# Patient Record
Sex: Male | Born: 1935 | Race: Black or African American | Hispanic: No | Smoking: Former smoker
Health system: Southern US, Community
[De-identification: ages and names within clinical notes are randomized; demographics above are authoritative.]

## PROBLEM LIST (undated history)

## (undated) DIAGNOSIS — C61 Malignant neoplasm of prostate: Secondary | ICD-10-CM

## (undated) DIAGNOSIS — E039 Hypothyroidism, unspecified: Secondary | ICD-10-CM

## (undated) DIAGNOSIS — K219 Gastro-esophageal reflux disease without esophagitis: Secondary | ICD-10-CM

## (undated) DIAGNOSIS — I10 Essential (primary) hypertension: Secondary | ICD-10-CM

## (undated) DIAGNOSIS — I739 Peripheral vascular disease, unspecified: Secondary | ICD-10-CM

## (undated) DIAGNOSIS — I509 Heart failure, unspecified: Secondary | ICD-10-CM

## (undated) DIAGNOSIS — I219 Acute myocardial infarction, unspecified: Secondary | ICD-10-CM

## (undated) DIAGNOSIS — I2510 Atherosclerotic heart disease of native coronary artery without angina pectoris: Secondary | ICD-10-CM

## (undated) DIAGNOSIS — I495 Sick sinus syndrome: Secondary | ICD-10-CM

## (undated) DIAGNOSIS — I872 Venous insufficiency (chronic) (peripheral): Secondary | ICD-10-CM

## (undated) DIAGNOSIS — N189 Chronic kidney disease, unspecified: Secondary | ICD-10-CM

## (undated) DIAGNOSIS — I482 Chronic atrial fibrillation, unspecified: Secondary | ICD-10-CM

## (undated) DIAGNOSIS — J449 Chronic obstructive pulmonary disease, unspecified: Secondary | ICD-10-CM

## (undated) HISTORY — PX: FRACTURE SURGERY: SHX138

## (undated) HISTORY — DX: Malignant neoplasm of prostate: C61

## (undated) HISTORY — DX: Chronic atrial fibrillation, unspecified: I48.20

## (undated) HISTORY — DX: Heart failure, unspecified: I50.9

## (undated) HISTORY — DX: Peripheral vascular disease, unspecified: I73.9

## (undated) HISTORY — DX: Sick sinus syndrome: I49.5

## (undated) HISTORY — DX: Atherosclerotic heart disease of native coronary artery without angina pectoris: I25.10

## (undated) HISTORY — DX: Chronic obstructive pulmonary disease, unspecified: J44.9

## (undated) HISTORY — DX: Gastro-esophageal reflux disease without esophagitis: K21.9

## (undated) HISTORY — DX: Venous insufficiency (chronic) (peripheral): I87.2

## (undated) HISTORY — PX: EYE SURGERY: SHX253

## (undated) HISTORY — PX: CARDIAC SURGERY: SHX584

## (undated) HISTORY — DX: Essential (primary) hypertension: I10

---

## 2000-11-12 ENCOUNTER — Encounter: Payer: Self-pay | Admitting: Surgery

## 2000-11-14 ENCOUNTER — Encounter: Payer: Self-pay | Admitting: Surgery

## 2000-11-14 ENCOUNTER — Inpatient Hospital Stay (HOSPITAL_COMMUNITY): Admission: RE | Admit: 2000-11-14 | Discharge: 2000-11-21 | Payer: Self-pay | Admitting: Surgery

## 2000-11-15 ENCOUNTER — Encounter: Payer: Self-pay | Admitting: Thoracic Surgery (Cardiothoracic Vascular Surgery)

## 2000-11-16 ENCOUNTER — Encounter: Payer: Self-pay | Admitting: Surgery

## 2000-11-20 ENCOUNTER — Encounter: Payer: Self-pay | Admitting: Surgery

## 2004-10-03 ENCOUNTER — Emergency Department: Payer: Self-pay | Admitting: Unknown Physician Specialty

## 2004-10-03 ENCOUNTER — Other Ambulatory Visit: Payer: Self-pay

## 2005-09-13 ENCOUNTER — Ambulatory Visit: Payer: Self-pay | Admitting: Nurse Practitioner

## 2007-02-04 ENCOUNTER — Other Ambulatory Visit: Payer: Self-pay

## 2007-02-04 ENCOUNTER — Emergency Department: Payer: Self-pay | Admitting: Emergency Medicine

## 2008-09-02 ENCOUNTER — Ambulatory Visit: Payer: Self-pay | Admitting: Family Medicine

## 2010-03-16 ENCOUNTER — Emergency Department: Payer: Self-pay | Admitting: Emergency Medicine

## 2010-03-19 ENCOUNTER — Inpatient Hospital Stay: Payer: Self-pay | Admitting: Specialist

## 2011-08-31 ENCOUNTER — Emergency Department: Payer: Self-pay | Admitting: Emergency Medicine

## 2011-08-31 LAB — URINALYSIS, COMPLETE
Bilirubin,UR: NEGATIVE
Blood: NEGATIVE
Hyaline Cast: 6
Ketone: NEGATIVE
Leukocyte Esterase: NEGATIVE
Nitrite: NEGATIVE
Ph: 6 (ref 4.5–8.0)
Protein: NEGATIVE
Specific Gravity: 1.005 (ref 1.003–1.030)
WBC UR: <1 /HPF (ref 0–5)

## 2011-08-31 LAB — CBC
HCT: 34.4 % — ABNORMAL LOW (ref 40.0–52.0)
HGB: 11.4 g/dL — ABNORMAL LOW (ref 13.0–18.0)
MCH: 30.6 pg (ref 26.0–34.0)
MCHC: 33.2 g/dL (ref 32.0–36.0)
MCV: 92 fL (ref 80–100)
Platelet: 151 10*3/uL (ref 150–440)
RBC: 3.73 10*6/uL — ABNORMAL LOW (ref 4.40–5.90)
RDW: 14.9 % — ABNORMAL HIGH (ref 11.5–14.5)
WBC: 8 10*3/uL (ref 3.8–10.6)

## 2011-08-31 LAB — COMPREHENSIVE METABOLIC PANEL
Albumin: 3.5 g/dL (ref 3.4–5.0)
Alkaline Phosphatase: 90 U/L (ref 50–136)
Anion Gap: 7 (ref 7–16)
BUN: 38 mg/dL — ABNORMAL HIGH (ref 7–18)
Bilirubin,Total: 0.5 mg/dL (ref 0.2–1.0)
Calcium, Total: 8.4 mg/dL — ABNORMAL LOW (ref 8.5–10.1)
Chloride: 104 mmol/L (ref 98–107)
Co2: 29 mmol/L (ref 21–32)
Creatinine: 2.37 mg/dL — ABNORMAL HIGH (ref 0.60–1.30)
EGFR (African American): 28 — ABNORMAL LOW
EGFR (Non-African Amer.): 24 — ABNORMAL LOW
Glucose: 99 mg/dL (ref 65–99)
Osmolality: 288 (ref 275–301)
Potassium: 3.8 mmol/L (ref 3.5–5.1)
SGOT(AST): 22 U/L (ref 15–37)
SGPT (ALT): 18 U/L
Sodium: 140 mmol/L (ref 136–145)
Total Protein: 6.9 g/dL (ref 6.4–8.2)

## 2011-08-31 LAB — TROPONIN I
Troponin-I: <0.02 ng/mL
Troponin-I: <0.02 ng/mL

## 2011-08-31 LAB — PROTIME-INR
INR: 0.9
Prothrombin Time: 12.9 secs (ref 11.5–14.7)

## 2011-08-31 LAB — MAGNESIUM: Magnesium: 1.8 mg/dL

## 2011-08-31 LAB — CK TOTAL AND CKMB (NOT AT ARMC)
CK, Total: 276 U/L — ABNORMAL HIGH (ref 35–232)
CK-MB: 2.6 ng/mL (ref 0.5–3.6)

## 2011-08-31 LAB — TSH: Thyroid Stimulating Horm: 2.69 u[IU]/mL

## 2011-08-31 LAB — PRO B NATRIURETIC PEPTIDE: B-Type Natriuretic Peptide: 243 pg/mL (ref 0–450)

## 2012-09-07 ENCOUNTER — Observation Stay: Payer: Self-pay | Admitting: Specialist

## 2012-09-07 LAB — CBC
HCT: 34.1 % — ABNORMAL LOW (ref 40.0–52.0)
MCV: 90 fL (ref 80–100)
Platelet: 165 10*3/uL (ref 150–440)
RBC: 3.78 10*6/uL — ABNORMAL LOW (ref 4.40–5.90)
RDW: 14.8 % — ABNORMAL HIGH (ref 11.5–14.5)

## 2012-09-07 LAB — URINALYSIS, COMPLETE
Bacteria: NONE SEEN
Bilirubin,UR: NEGATIVE
Glucose,UR: NEGATIVE mg/dL (ref 0–75)
Hyaline Cast: 3
Ketone: NEGATIVE
Leukocyte Esterase: NEGATIVE
Protein: NEGATIVE
RBC,UR: <1 /HPF (ref 0–5)
Specific Gravity: 1.013 (ref 1.003–1.030)
WBC UR: 1 /HPF (ref 0–5)

## 2012-09-07 LAB — COMPREHENSIVE METABOLIC PANEL
Albumin: 3.5 g/dL (ref 3.4–5.0)
BUN: 34 mg/dL — ABNORMAL HIGH (ref 7–18)
Chloride: 103 mmol/L (ref 98–107)
Co2: 27 mmol/L (ref 21–32)
Glucose: 95 mg/dL (ref 65–99)
Osmolality: 285 (ref 275–301)
Potassium: 4.3 mmol/L (ref 3.5–5.1)
SGPT (ALT): 17 U/L (ref 12–78)
Sodium: 139 mmol/L (ref 136–145)

## 2012-09-07 LAB — TSH: Thyroid Stimulating Horm: 5.75 u[IU]/mL — ABNORMAL HIGH

## 2012-09-07 LAB — TROPONIN I: Troponin-I: <0.02 ng/mL

## 2012-09-07 LAB — MAGNESIUM: Magnesium: 1.4 mg/dL — ABNORMAL LOW

## 2012-09-08 LAB — CBC WITH DIFFERENTIAL/PLATELET
Basophil #: 0 10*3/uL (ref 0.0–0.1)
Basophil %: 0.5 %
Eosinophil #: 0.3 10*3/uL (ref 0.0–0.7)
Eosinophil %: 4.7 %
HCT: 30.8 % — ABNORMAL LOW (ref 40.0–52.0)
HGB: 10.1 g/dL — ABNORMAL LOW (ref 13.0–18.0)
Lymphocyte %: 12.5 %
MCH: 29.5 pg (ref 26.0–34.0)
MCHC: 32.7 g/dL (ref 32.0–36.0)
Monocyte #: 0.8 x10 3/mm (ref 0.2–1.0)
Neutrophil #: 4.9 10*3/uL (ref 1.4–6.5)
Platelet: 156 10*3/uL (ref 150–440)
WBC: 6.9 10*3/uL (ref 3.8–10.6)

## 2012-09-08 LAB — BASIC METABOLIC PANEL
Chloride: 103 mmol/L (ref 98–107)
Co2: 30 mmol/L (ref 21–32)
EGFR (African American): 34 — ABNORMAL LOW
Osmolality: 287 (ref 275–301)
Sodium: 140 mmol/L (ref 136–145)

## 2012-09-08 LAB — TROPONIN I
Troponin-I: 0.21 ng/mL — ABNORMAL HIGH
Troponin-I: 0.36 ng/mL — ABNORMAL HIGH

## 2012-09-08 LAB — LIPID PANEL
Cholesterol: 110 mg/dL (ref 0–200)
Ldl Cholesterol, Calc: 55 mg/dL (ref 0–100)
Triglycerides: 69 mg/dL (ref 0–200)
VLDL Cholesterol, Calc: 14 mg/dL (ref 5–40)

## 2013-04-16 ENCOUNTER — Ambulatory Visit: Payer: Self-pay | Admitting: Ophthalmology

## 2013-04-16 LAB — POTASSIUM: POTASSIUM: 4 mmol/L (ref 3.5–5.1)

## 2013-04-19 ENCOUNTER — Emergency Department: Payer: Self-pay | Admitting: Emergency Medicine

## 2013-05-30 ENCOUNTER — Emergency Department: Payer: Self-pay | Admitting: Emergency Medicine

## 2013-05-30 LAB — URINALYSIS, COMPLETE
BLOOD: NEGATIVE
Bacteria: NONE SEEN
Bilirubin,UR: NEGATIVE
GLUCOSE, UR: NEGATIVE mg/dL (ref 0–75)
Hyaline Cast: 8
Ketone: NEGATIVE
Leukocyte Esterase: NEGATIVE
Nitrite: NEGATIVE
Ph: 5 (ref 4.5–8.0)
Protein: NEGATIVE
RBC,UR: <1 /HPF (ref 0–5)
Specific Gravity: 1.01 (ref 1.003–1.030)
Squamous Epithelial: <1
WBC UR: 2 /HPF (ref 0–5)

## 2013-05-30 LAB — CBC
HCT: 36.2 % — ABNORMAL LOW (ref 40.0–52.0)
HGB: 11.8 g/dL — AB (ref 13.0–18.0)
MCH: 29.3 pg (ref 26.0–34.0)
MCHC: 32.6 g/dL (ref 32.0–36.0)
MCV: 90 fL (ref 80–100)
Platelet: 163 10*3/uL (ref 150–440)
RBC: 4.02 10*6/uL — ABNORMAL LOW (ref 4.40–5.90)
RDW: 15.1 % — AB (ref 11.5–14.5)
WBC: 9 10*3/uL (ref 3.8–10.6)

## 2013-05-30 LAB — TSH: THYROID STIMULATING HORM: 6.88 u[IU]/mL — AB

## 2013-05-30 LAB — COMPREHENSIVE METABOLIC PANEL
AST: 13 U/L — AB (ref 15–37)
Albumin: 3.2 g/dL — ABNORMAL LOW (ref 3.4–5.0)
Alkaline Phosphatase: 85 U/L
Anion Gap: 7 (ref 7–16)
BUN: 22 mg/dL — ABNORMAL HIGH (ref 7–18)
Bilirubin,Total: 0.3 mg/dL (ref 0.2–1.0)
CHLORIDE: 102 mmol/L (ref 98–107)
Calcium, Total: 8.2 mg/dL — ABNORMAL LOW (ref 8.5–10.1)
Co2: 30 mmol/L (ref 21–32)
Creatinine: 1.95 mg/dL — ABNORMAL HIGH (ref 0.60–1.30)
EGFR (African American): 35 — ABNORMAL LOW
EGFR (Non-African Amer.): 30 — ABNORMAL LOW
Glucose: 111 mg/dL — ABNORMAL HIGH (ref 65–99)
OSMOLALITY: 282 (ref 275–301)
Potassium: 3.7 mmol/L (ref 3.5–5.1)
SGPT (ALT): 18 U/L (ref 12–78)
Sodium: 139 mmol/L (ref 136–145)
Total Protein: 6.9 g/dL (ref 6.4–8.2)

## 2013-05-30 LAB — MAGNESIUM: MAGNESIUM: 1.6 mg/dL — AB

## 2013-05-30 LAB — TROPONIN I: Troponin-I: 0.02 ng/mL

## 2013-07-04 DIAGNOSIS — I2510 Atherosclerotic heart disease of native coronary artery without angina pectoris: Secondary | ICD-10-CM | POA: Diagnosis present

## 2013-07-23 ENCOUNTER — Ambulatory Visit: Payer: Self-pay | Admitting: Ophthalmology

## 2013-07-23 LAB — POTASSIUM: Potassium: 4 mmol/L (ref 3.5–5.1)

## 2013-08-05 ENCOUNTER — Ambulatory Visit: Payer: Self-pay | Admitting: Ophthalmology

## 2013-08-18 ENCOUNTER — Ambulatory Visit: Payer: Self-pay | Admitting: Ophthalmology

## 2013-08-18 LAB — POTASSIUM: Potassium: 4.1 mmol/L (ref 3.5–5.1)

## 2013-08-26 ENCOUNTER — Ambulatory Visit: Payer: Self-pay | Admitting: Ophthalmology

## 2014-06-19 NOTE — Consult Note (Signed)
   Present Illness 81 yo male with history of cad s/p cabg at [facility] in 2002 with a lima to lad, svg to D1, svg to OM1, and svg to PDA who was admitted with an episode of increased shortness of breath, weakness and near syncope. EMS was called and he was noted to be in a narrow complex tachycardia with rates near 200. He is currently in nsr with pacs at 80-90 bpm. He denies any prior events such as this. His lv function was normal by echo one year ago. He has a mild serum troponin elevation to 0.3. He also has CKD with serum creatinine of 2.2. He is asymptomatic at present. He denies missing any of his medications and denies chest pain or shrontess of breath at present.   Physical Exam:  GEN no acute distress, obese   HEENT PERRL, hearing intact to voice   NECK supple   RESP normal resp effort  clear BS   CARD Regular rate and rhythm  No murmur  frequent palpitations   ABD no hernia  normal BS  no Adominal Mass   LYMPH negative neck, negative axillae   EXTR negative cyanosis/clubbing, negative edema   SKIN normal to palpation   NEURO cranial nerves intact, motor/sensory function intact   PSYCH A+O to time, place, person   Review of Systems:  Subjective/Chief Complaint episode of tachycardia with associated dizziness   General: No Complaints   Skin: No Complaints   ENT: No Complaints   Eyes: No Complaints   Neck: No Complaints   Respiratory: No Complaints   Cardiovascular: Palpitations   Gastrointestinal: No Complaints   Genitourinary: No Complaints   Vascular: No Complaints   Musculoskeletal: No Complaints   Neurologic: Dizzness   Hematologic: No Complaints   Endocrine: No Complaints   Psychiatric: No Complaints   Review of Systems: All other systems were reviewed and found to be negative   Medications/Allergies Reviewed Medications/Allergies reviewed   EKG:  Interpretation narrow complex tachcyardia converted to nsr with pacs. Afib with rvr vs  rentry tachcyardia    No Known Allergies:    Impression 81 yo male with history of cad s/p cabg in 2003 admitted with dizziness and rapid heart rate. Rhythm appears to be afib with rvr vs a re-entry tachycardia. Mild troponin elevation which appears to be secondary to demand with his rapid heart rate in face of CKD with creatinine of 2.2. He is in nsr at present with pacs. Was on metoprolol succinate at 25 mg daily as outpatient. Echo pending. Does not appear to be in chf or ischemic at present. Brain mri, ct, carotids negative for signficant abnormalities   Plan 1. Continue with metoprolol 25 bid 2. Conitnue with other cardiac meds 3. Review echo when available 4. Ambulate on current meds 5. Continue with asa, would defer chronic anticoagulation at present 6. If echo unremarkable and ambulates well, discharge in 24-48 hours with 48 hour holter in place 7. Follow up with Dr. Nehemiah Massed as outpatient   Electronic Signatures: Teodoro Spray (MD)  (Signed 13-Jul-14 10:35)  Authored: General Aspect/Present Illness, History and Physical Exam, Review of System, EKG , Allergies, Impression/Plan   Last Updated: 13-Jul-14 10:35 by Teodoro Spray (MD)

## 2014-06-19 NOTE — Discharge Summary (Signed)
PATIENT NAME:  Gary Grant, Gary Grant MR#:  [id number] DATE OF BIRTH:  11-Sep-1923  DATE OF ADMISSION:  09/07/2012 DATE OF DISCHARGE:  09/08/2012  For detailed note, please take a look at the history and physical done on admission by Dr. Leslye Peer.   DISCHARGE DIAGNOSES: Supraventricular tachycardia, now resolved. Benign prostatic hypertrophy. Suspected transient ischemic attack ruled out. Hyperlipidemia. Elevated troponin, likely in the setting of demand ischemia. Chronic kidney disease, stage III.   DIET: The patient is discharged on a low-sodium, low-fat diet.   ACTIVITY: As tolerated.   FOLLOWUP: With Dr. Ubaldo Glassing in the next 1 to 2 weeks. Also follow up with Dr. Delight Stare in the next 1 to 2 weeks.   DISCHARGE MEDICATIONS: Enalapril 2.5 mg daily, aspirin 81 mg daily, Lasix 40 mg daily in the morning and 1 tablet at lunch as needed for swelling, atorvastatin 20 mg at bedtime, amlodipine 5 mg daily, Flomax 0.4 mg daily, vitamin D 1000 international units daily, metoprolol tartrate 25 mg b.i.d.   CONSULTANTS DURING THE HOSPITAL COURSE: Dr. Jordan Hawks from cardiology.   PERTINENT STUDIES DONE DURING THE HOSPITAL COURSE: CT scan of the head done without contrast on admission showing no acute intracranial process. A chest x-ray done on admission showing no acute cardiopulmonary disease. An MRI of the brain done without contrast showing no acute intracranial abnormality. An ultrasound of the carotids showing no evidence of any hemodynamically significant carotid artery stenosis. A 2-dimensional echocardiogram done showing ejection fraction to be 60% to 65%, normal global LV systolic function, mildly dilated left atrium, mild to moderate aortic regurgitation.   HOSPITAL COURSE: This is an 81 year old male with medical problems as mentioned above, presented to the hospital with dizziness, slurred speech and weakness on the left side and noted to be in supraventricular tachycardia.  1.  SVT: The patient presented  to the hospital with heart rates in the 130s to 140s. The patient was given pulse doses of IV Cardizem, and his metoprolol dose was increased from metoprolol 25 daily to metoprolol 25 b.i.d. The patient's heart rate significantly improved. He is now back in normal sinus rhythm. It was unclear whether the SVT was A. fib or flutter or reentrant supraventricular tachycardia. A cardiology consult was obtained. They  recommended that the patient be discharged on a 48-hour Holter, which was arranged for the patient. The patient's echocardiogram did not show any evidence of LV dysfunction and since the patient was ambulated and had no further episodes of SVT and is not symptomatic, he was discharged home.  2.  Elevated troponin: This was likely in the setting of demand ischemia from the SVT. The patient had no acute chest pain. His troponins actually trended down. His echo showed no evidence of LV dysfunction. He will continue his aspirin, statin and beta blocker and continue followup with cardiology as an outpatient.  3.  Chronic kidney disease, stage III: The patient's creatinine is at baseline. He will continue followup as outpatient with Dr. Candiss Norse.  4.  Suspected TIA/CVA: The patient presented to the hospital with some slurred speech, dizziness and left-sided weakness. He had a CT head done on admission, which was negative. He had a followup MRI brain and a carotid duplex, which were also negative, and he is clinically asymptomatic now. His echo also did not show any evidence of any acute thrombus. He will continue his aspirin and statin as stated. Therefore, the TIA/CVA has now been ruled out.  5.  Hyperlipidemia: The patient was maintained on  his atorvastatin. He will resume that.  6.  BPH: He had no evidence of urinary obstruction. He will continue his Flomax upon discharge.   CODE STATUS: The patient is a full code.   TIME SPENT: 40 minutes.   ____________________________ Belia Heman. Verdell Carmine,  MD vjs:jm D: 09/09/2012 14:20:42 ET T: 09/09/2012 16:26:31 ET JOB#: [id number]  cc: Belia Heman. Verdell Carmine, MD, <Dictator> Javier Docker. Ubaldo Glassing, MD Marguerita Merles, MD Henreitta Leber MD ELECTRONICALLY SIGNED 09/09/2012 20:45

## 2014-06-19 NOTE — H&P (Signed)
PATIENT NAME:  Gary Grant, Gary Grant MR#:  [id number] DATE OF BIRTH:  1923/07/22  DATE OF ADMISSION:  09/07/2012  PRIMARY CARE PHYSICIAN: Terrence Dupont R. Jimmye Norman, MD, at the [facility].   CARDIOLOGIST: Corey Skains, MD  CHIEF COMPLAINT: Shortness of breath, headache, sweating, dizziness.   HISTORY OF PRESENT ILLNESS: This is an 81 year old man who was sitting under the tree all day. He was walking around the yard then and bending over to pick up papers and felt dizzy. He went into the house. He could not see very well, saw white blobs and blurring of vision, both eyes. He had a headache. He was cold to the touch and sweaty and some shortness of breath. No complaints of chest pain. He did have a headache. When EMS arrived, he was found to be in SVT. He was brought to the Emergency Room. In the ER, he was found to have left-sided weakness, but the patient states that he had a fall the other day and he hurt his shoulder and is having pain in the left shoulder. He was also found to have a magnesium of 1.4 and hospitalist services were contacted for further evaluation. He also did have on exam left leg weakness.   PAST MEDICAL HISTORY: Hypertension, BPH, edema, coronary artery disease.   PAST SURGICAL HISTORY: CABG back in 2003, right leg fracture as a child.   ALLERGIES: No known drug allergies.   MEDICATIONS: Include Lasix 40 mg once a day and he occasionally takes it twice a day, enalapril 2.5 mg daily, metoprolol ER 25 mg daily, Norvasc 5 mg daily, Lipitor 20 mg daily, Flomax 0.4 mg daily, aspirin 81 mg daily, vitamin D3 at 1000 international units daily.   SOCIAL HISTORY: No smoking. Occasional alcohol. No drug use. He used to work as a Sports coach. He lives alone.   FAMILY HISTORY: Father died at 20 of heart disease. Mother died at 48, likely had heart disease and hypertension.   REVIEW OF SYSTEMS: CONSTITUTIONAL: Positive for sweating. No fever or chills. No weakness. No weight loss.  EYES: He  did have blurry vision, both eyes, which lasted about 20 minutes.  EARS, NOSE, MOUTH AND THROAT: Decreased hearing. No sore throat. No difficulty swallowing.  CARDIOVASCULAR: No chest pain. No palpitations.  RESPIRATORY: Positive for shortness of breath. No coughing. No sputum. No hemoptysis.  GASTROINTESTINAL: Positive for constipation. No nausea. No vomiting. No abdominal pain. No bright red blood per rectum. No melena.  GENITOURINARY: No burning on urination. No hematuria.  MUSCULOSKELETAL: Positive for left shoulder pain and limited motion.  INTEGUMENTARY: No rashes or eruptions.  NEUROLOGIC: No fainting or blackouts.  PSYCHIATRIC: No anxiety or depression.  ENDOCRINE: No thyroid problems.  HEMATOLOGIC AND LYMPHATIC: No anemia.   PHYSICAL EXAMINATION: VITAL SIGNS: Temperature 98.3, pulse 91, respirations 20, blood pressure 133/77, pulse oximetry 97% on oxygen.  GENERAL: No respiratory distress.  EYES: Conjunctivae and lids normal. Pupils equal, round and reactive to light. Extraocular muscles intact. No nystagmus.  EARS, NOSE, MOUTH AND THROAT: Tympanic membranes: No erythema. Nasal mucosa: No erythema. Throat: No erythema, no exudate seen. Lips and gums: No lesions.  NECK: No JVD. No bruits. No lymphadenopathy. No thyromegaly. No thyroid nodules palpated.  LUNGS: Clear to auscultation. No use of accessory muscles to breathe. No rhonchi, rales or wheeze heard.  CARDIOVASCULAR: S1, S2 normal. No gallops, rubs or murmurs heard. Carotid upstroke 2+  bilaterally. No bruits.  EXTREMITIES: Dorsalis pedis pulses 2+ bilaterally, 3+ edema.  ABDOMEN: Soft, nontender.  No organomegaly/splenomegaly. Normoactive bowel sounds. No masses felt.  LYMPHATIC: No lymph nodes in the neck.  MUSCULOSKELETAL: No clubbing, 3+ edema, no cyanosis.  SKIN: No ulcers or lesions seen.  NEUROLOGIC: Cranial nerves II through XII grossly intact. Deep tendon reflexes 1+ bilateral lower extremities. Power 5 out of 5 right  upper and lower extremity, 3+ on the left lower extremity. Grip strength on the left upper extremity 5 out of 5. Arm strength 5 out of 5. Unable to move left shoulder very well secondary to pain with movement.  PSYCHIATRIC: The patient is oriented to person, place and time.   LABORATORY, DIAGNOSTIC AND RADIOLOGICAL DATA: CT scan of the head was negative. Glucose 95, BUN 34, creatinine 2.21, sodium 139, potassium 4.3, chloride 103, CO2 of 27, calcium 8.5. Liver function tests: Normal range. GFR 30. White blood cell count 7.7, hemoglobin and hematocrit 11.4 and 34.1, platelet count 165. Magnesium 1.4. TSH 5.75. Troponin negative. Urinalysis negative. INR 1. EKG by EMS showed an SVT greater than 150. EKG here showed a normal sinus rhythm at 96 beats per minute.   ASSESSMENT AND PLAN: 1.  Transient supraventricular tachycardia: Will increase metoprolol to 25 mg b.i.d. Get an echocardiogram. Replace magnesium and give intravenous fluid hydration.  2.  Left-sided weakness: Will rule out a cerebrovascular accident. Will get an MRI of the brain, echocardiogram, carotid ultrasound. Will continue his aspirin for right now. If stroke is positive, will have to take a step up in treatment with either Plavix or Aggrenox. Will get a physical therapy and an occupational therapy evaluation.  3.  Left shoulder pain with limited movement: Most likely frozen shoulder from trauma. Will get an x-ray of the left shoulder and an occupational therapy evaluation.  4.  Hypertension: We will hold Lasix, enalapril and Norvasc for right now since blood pressure on the lower side, and increasing the metoprolol to twice a day.  5.  Chronic kidney disease, stage III, borderline stage IV: Will monitor with intravenous fluid hydration.  6.  Benign prostatic hypertrophy: Continue Flomax.  7.  Hyperlipidemia: Continue statin. Check a lipid profile in the morning.  8.  Hypomagnesemia: Replace magnesium intravenously. Recheck in the morning.    TIME SPENT ON ADMISSION: 55 minutes.    ____________________________ Tana Conch. Leslye Peer, MD rjw:jm D: 09/07/2012 18:25:27 ET T: 09/07/2012 20:35:49 ET JOB#: [id number]  cc: Tana Conch. Leslye Peer, MD, <Dictator> Myrle Sheng. Jimmye Norman, MD Corey Skains, MD  Marisue Brooklyn MD ELECTRONICALLY SIGNED 09/10/2012 19:09

## 2014-06-20 NOTE — Op Note (Signed)
PATIENT NAME:  Gary Grant, Gary Grant MR#:  [id number] DATE OF BIRTH:  19-Sep-1923  DATE OF PROCEDURE:  08/05/2013  LOCATION:  [place]  PREOPERATIVE DIAGNOSIS: Visually significant cataract of the right eye.   POSTOPERATIVE DIAGNOSIS: Visually significant cataract of the right eye.   OPERATIVE PROCEDURE: Cataract extraction by phacoemulsification with implant of intraocular lens to right eye.   SURGEON: Birder Robson, MD.   ANESTHESIA:  1. Managed anesthesia care.  2. Topical tetracaine drops followed by 2% Xylocaine jelly applied in the preoperative holding area.   COMPLICATIONS: None.   TECHNIQUE:  Stop and chop.  DESCRIPTION OF PROCEDURE: The patient was examined and consented in the preoperative holding area where the aforementioned topical anesthesia was applied to the right eye and then brought back to the Operating Room where the right eye was prepped and draped in the usual sterile ophthalmic fashion and a lid speculum was placed. A paracentesis was created with the side port blade and the anterior chamber was filled with viscoelastic. A near clear corneal incision was performed with the steel keratome. A continuous curvilinear capsulorrhexis was performed with a cystotome followed by the capsulorrhexis forceps. Hydrodissection and hydrodelineation were carried out with BSS on a blunt cannula. The lens was removed in a stop and chop technique and the remaining cortical material was removed with the irrigation-aspiration handpiece. The capsular bag was inflated with viscoelastic and the Tecnis ZCB00, 18.5-diopter lens, serial number [id number] was placed in the capsular bag without complication. The remaining viscoelastic was removed from the eye with the irrigation-aspiration handpiece. The wounds were hydrated. The anterior chamber was flushed with Miostat and the eye was inflated to physiologic pressure. 0.1 mL of cefuroxime concentration 10 mg/mL was placed in the anterior  chamber. The wounds were found to be water tight. The eye was dressed with Vigamox and Combigan. The patient was given protective glasses to wear throughout the day and a shield with which to sleep tonight. The patient was also given drops with which to begin a drop regimen today and will follow up with me in one day.    ____________________________ Livingston Diones. Etienne Mowers, MD wlp:dd D: 08/05/2013 15:30:19 ET T: 08/05/2013 19:45:43 ET JOB#: [id number]  cc: Earnestine L. Anet Logsdon, MD, <Dictator> Livingston Diones Jatasia Gundrum MD ELECTRONICALLY SIGNED 08/08/2013 12:50

## 2014-06-20 NOTE — Op Note (Signed)
PATIENT NAME:  Gary Grant, Gary Grant MR#:  [id number] DATE OF BIRTH:  07/18/1923  DATE OF PROCEDURE:  08/26/2013  PREOPERATIVE DIAGNOSIS: Visually significant cataract of the left eye.   POSTOPERATIVE DIAGNOSIS: Visually significant cataract of the left eye.   OPERATIVE PROCEDURE: Cataract extraction by phacoemulsification with implant of intraocular lens to left eye.   SURGEON: Birder Robson, MD.   ANESTHESIA:  1. Managed anesthesia care.  2. Topical tetracaine drops followed by 2% Xylocaine jelly applied in the preoperative holding area.   COMPLICATIONS: None.   TECHNIQUE:  Stop and chop.   DESCRIPTION OF PROCEDURE: The patient was examined and consented in the preoperative holding area where the aforementioned topical anesthesia was applied to the left eye and then brought back to the Operating Room where the left eye was prepped and draped in the usual sterile ophthalmic fashion and a lid speculum was placed. A paracentesis was created with the side port blade and the anterior chamber was filled with viscoelastic. A near clear corneal incision was performed with the steel keratome. A continuous curvilinear capsulorrhexis was performed with a cystotome followed by the capsulorrhexis forceps. Hydrodissection and hydrodelineation were carried out with BSS on a blunt cannula. The lens was removed in a stop and chop technique and the remaining cortical material was removed with the irrigation-aspiration handpiece. The capsular bag was inflated with viscoelastic and the Tecnis ZCB00 19.0-diopter lens, serial number [id number] was placed in the capsular bag without complication. The remaining viscoelastic was removed from the eye with the irrigation-aspiration handpiece. The wounds were hydrated. The anterior chamber was flushed with Miostat and the eye was inflated to physiologic pressure. 0.1 mL of cefuroxime concentration 10 mg/mL was placed in the anterior chamber. The wounds were found to be water  tight. The eye was dressed with Vigamox. The patient was given protective glasses to wear throughout the day and a shield with which to sleep tonight. The patient was also given drops with which to begin a drop regimen today and will follow-up with me in one day.    ____________________________ Gary Diones. Porfilio, MD wlp:lt D: 08/26/2013 14:28:11 ET T: 08/26/2013 21:59:47 ET JOB#: [id number]  cc: Aikam L. Porfilio, MD, <Dictator> Gary Diones PORFILIO MD ELECTRONICALLY SIGNED 08/28/2013 10:49

## 2014-09-04 ENCOUNTER — Encounter: Payer: Self-pay | Admitting: *Deleted

## 2015-05-02 ENCOUNTER — Emergency Department: Payer: Medicare HMO

## 2015-05-02 ENCOUNTER — Emergency Department
Admission: EM | Admit: 2015-05-02 | Discharge: 2015-05-02 | Disposition: A | Discharge status: Home / Self Care | Payer: Medicare HMO | Source: Home / Self Care | Attending: Emergency Medicine | Admitting: Emergency Medicine

## 2015-05-02 DIAGNOSIS — R109 Unspecified abdominal pain: Secondary | ICD-10-CM | POA: Insufficient documentation

## 2015-05-02 DIAGNOSIS — R1013 Epigastric pain: Secondary | ICD-10-CM | POA: Diagnosis not present

## 2015-05-02 DIAGNOSIS — Z7982 Long term (current) use of aspirin: Secondary | ICD-10-CM

## 2015-05-02 DIAGNOSIS — N179 Acute kidney failure, unspecified: Secondary | ICD-10-CM | POA: Diagnosis not present

## 2015-05-02 DIAGNOSIS — Z79899 Other long term (current) drug therapy: Secondary | ICD-10-CM

## 2015-05-02 DIAGNOSIS — I10 Essential (primary) hypertension: Secondary | ICD-10-CM

## 2015-05-02 LAB — URINALYSIS COMPLETE WITH MICROSCOPIC (ARMC ONLY)
BACTERIA UA: NONE SEEN
BILIRUBIN URINE: NEGATIVE
Glucose, UA: NEGATIVE mg/dL
Hgb urine dipstick: NEGATIVE
KETONES UR: NEGATIVE mg/dL
LEUKOCYTES UA: NEGATIVE
NITRITE: NEGATIVE
PH: 7 (ref 5.0–8.0)
PROTEIN: NEGATIVE mg/dL
SPECIFIC GRAVITY, URINE: 1.016 (ref 1.005–1.030)
Squamous Epithelial / LPF: NONE SEEN

## 2015-05-02 LAB — CBC
HEMATOCRIT: 34 % — AB (ref 40.0–52.0)
Hemoglobin: 11 g/dL — ABNORMAL LOW (ref 13.0–18.0)
MCH: 30 pg (ref 26.0–34.0)
MCHC: 32.5 g/dL (ref 32.0–36.0)
MCV: 92.3 fL (ref 80.0–100.0)
Platelets: 179 10*3/uL (ref 150–440)
RBC: 3.68 MIL/uL — AB (ref 4.40–5.90)
RDW: 14.8 % — AB (ref 11.5–14.5)
WBC: 7.9 10*3/uL (ref 3.8–10.6)

## 2015-05-02 LAB — COMPREHENSIVE METABOLIC PANEL
ALT: 12 U/L — ABNORMAL LOW (ref 17–63)
ANION GAP: 6 (ref 5–15)
AST: 14 U/L — ABNORMAL LOW (ref 15–41)
Albumin: 3.7 g/dL (ref 3.5–5.0)
Alkaline Phosphatase: 84 U/L (ref 38–126)
BILIRUBIN TOTAL: 0.7 mg/dL (ref 0.3–1.2)
BUN: 43 mg/dL — ABNORMAL HIGH (ref 6–20)
CHLORIDE: 107 mmol/L (ref 101–111)
CO2: 31 mmol/L (ref 22–32)
Calcium: 8.5 mg/dL — ABNORMAL LOW (ref 8.9–10.3)
Creatinine, Ser: 2.07 mg/dL — ABNORMAL HIGH (ref 0.61–1.24)
GFR, EST AFRICAN AMERICAN: 31 mL/min — AB (ref >60)
GFR, EST NON AFRICAN AMERICAN: 27 mL/min — AB (ref >60)
Glucose, Bld: 99 mg/dL (ref 65–99)
POTASSIUM: 4.5 mmol/L (ref 3.5–5.1)
Sodium: 144 mmol/L (ref 135–145)
TOTAL PROTEIN: 7.1 g/dL (ref 6.5–8.1)

## 2015-05-02 LAB — TROPONIN I: Troponin I: <0.03 ng/mL (ref <0.031)

## 2015-05-02 LAB — LIPASE, BLOOD: LIPASE: 29 U/L (ref 11–51)

## 2015-05-02 MED ORDER — GI COCKTAIL ~~LOC~~
30.0000 mL | Freq: Once | ORAL | Status: AC
  Administered 2015-05-02: 30 mL via ORAL

## 2015-05-02 MED ORDER — IOHEXOL 240 MG/ML SOLN: 50.0000 mL | Freq: Once | INTRAMUSCULAR | Status: DC | PRN

## 2015-05-02 MED ORDER — GI COCKTAIL ~~LOC~~
ORAL | Status: AC
Start: 2015-05-02 — End: 2015-05-02
  Administered 2015-05-02: 30 mL via ORAL
  Filled 2015-05-02: qty 30

## 2015-05-02 MED ORDER — SUCRALFATE 1 G PO TABS: 1.0000 g | ORAL_TABLET | Freq: Four times a day (QID) | ORAL | Status: DC

## 2015-05-02 NOTE — ED Notes (Signed)
Pt reports upper abd pain for past week. Denies vomiting or diarrhea. Denies fever reports feeling sore all other.

## 2015-05-02 NOTE — ED Notes (Signed)
Discussed discharge instructions, prescriptions, and follow-up care with patient and daughter, with pt's permission. No questions or concerns at this time. Pt stable at discharge.

## 2015-05-02 NOTE — Discharge Instructions (Signed)
Please seek medical attention for any high fevers, chest pain, shortness of breath, change in behavior, persistent vomiting, bloody stool or any other new or concerning symptoms. ° ° °Abdominal Pain, Adult °Many things can cause belly (abdominal) pain. Most times, the belly pain is not dangerous. Many cases of belly pain can be watched and treated at home. °HOME CARE  °· Do not take medicines that help you go poop (laxatives) unless told to by your doctor. °· Only take medicine as told by your doctor. °· Eat or drink as told by your doctor. Your doctor will tell you if you should be on a special diet. °GET HELP IF: °· You do not know what is causing your belly pain. °· You have belly pain while you are sick to your stomach (nauseous) or have runny poop (diarrhea). °· You have pain while you pee or poop. °· Your belly pain wakes you up at night. °· You have belly pain that gets worse or better when you eat. °· You have belly pain that gets worse when you eat fatty foods. °· You have a fever. °GET HELP RIGHT AWAY IF:  °· The pain does not go away within 2 hours. °· You keep throwing up (vomiting). °· The pain changes and is only in the right or left part of the belly. °· You have bloody or tarry looking poop. °MAKE SURE YOU:  °· Understand these instructions. °· Will watch your condition. °· Will get help right away if you are not doing well or get worse. °  °This information is not intended to replace advice given to you by your health care provider. Make sure you discuss any questions you have with your health care provider. °  °Document Released: 08/02/2007 Document Revised: 03/06/2014 Document Reviewed: 10/23/2012 °Elsevier Interactive Patient Education ©2016 [employer] ° °

## 2015-05-02 NOTE — ED Notes (Signed)
CT notified pt finished with oral contrast. CT said they will put him on their list.

## 2015-05-02 NOTE — ED Provider Notes (Signed)
[facility] Emergency Department Provider Note    ____________________________________________  Time seen: ~1225  I have reviewed the triage vital signs and the nursing notes.   HISTORY  Chief Complaint Abdominal Pain   History limited by: Not Limited   HPI Gary Grant is a 82 y.o. male who presents to the emergency department today because of concerns for abdominal pain. He states that the pain is located in the epigastric region. It started 2 weeks ago. It is intermittent. It comes and goes. It is sharp. It will become severe. He denies any change of the pain with eating. He states that sometimes it is worse depending on what position he is in. He is tried Alka-Seltzer and Zantac without any relief. He denies any associated fevers. No associated worsening of shortness of breath. No change in defecation. No change in urination.    Past Medical History  Diagnosis Date  . Prostate cancer   . SSS (sick sinus syndrome)   . Chronic a-fib   . GERD (gastroesophageal reflux disease)   . CAD (coronary artery disease)   . PAD (peripheral artery disease)   . Venous insufficiency   . HTN (hypertension)   . COPD (chronic obstructive pulmonary disease)     There are no active problems to display for this patient.   No past surgical history on file.  Current Outpatient Rx  Name  Route  Sig  Dispense  Refill  . amLODipine (NORVASC) 2.5 MG tablet   Oral   Take 2.5 mg by mouth daily.         Marland Kitchen aspirin 81 MG tablet   Oral   Take 81 mg by mouth daily.         . Cholecalciferol (VITAMIN D PO)   Oral   Take by mouth.         . cilostazol (PLETAL) 50 MG tablet   Oral   Take 50 mg by mouth 2 (two) times daily.         . furosemide (LASIX) 20 MG tablet   Oral   Take 30 mg by mouth.         . Nebivolol HCl 20 MG TABS   Oral   Take by mouth.         . pantoprazole (PROTONIX) 40 MG tablet   Oral   Take 40 mg by mouth daily.          . Rosuvastatin Calcium (CRESTOR PO)   Oral   Take by mouth.           Allergies Review of patient's allergies indicates no known allergies.  Family History  Problem Relation Age of Onset  . Osteoporosis Mother   . Heart Problems Father   . Stroke Maternal Grandfather   . Stroke Paternal Grandfather   . Stroke Paternal Grandmother     Social History Social History  Substance Use Topics  . Smoking status: Not on file  . Smokeless tobacco: Not on file  . Alcohol Use: Not on file    Review of Systems  Constitutional: Negative for fever. Cardiovascular: Negative for chest pain. Respiratory: Negative for shortness of breath. Gastrointestinal: positive for epigastric pain Neurological: Negative for headaches, focal weakness or numbness.  10-point ROS otherwise negative.  ____________________________________________   PHYSICAL EXAM:  VITAL SIGNS: ED Triage Vitals  Enc Vitals Group     BP 05/02/15 0909 151/47 mmHg     Pulse Rate 05/02/15 0909 44  Resp 05/02/15 0909 16     Temp 05/02/15 0909 98.1 F (36.7 C)     Temp src --      SpO2 05/02/15 0909 97 %     Weight 05/02/15 0909 270 lb (122.471 kg)     Height 05/02/15 0909 5\' 11"  (1.803 m)     Head Cir --      Peak Flow --      Pain Score 05/02/15 0910 10   Constitutional: Alert and oriented. Well appearing and in no distress. Eyes: Conjunctivae are normal. PERRL. Normal extraocular movements. ENT   Head: Normocephalic and atraumatic.   Nose: No congestion/rhinnorhea.   Mouth/Throat: Mucous membranes are moist.   Neck: No stridor. Hematological/Lymphatic/Immunilogical: No cervical lymphadenopathy. Cardiovascular: Normal rate, regular rhythm.  No murmurs, rubs, or gallops. Respiratory: Normal respiratory effort without tachypnea nor retractions. Breath sounds are clear and equal bilaterally. No wheezes/rales/rhonchi. Gastrointestinal: Soft and tender to palpation in the epigastric region.   Genitourinary: Deferred Musculoskeletal: Normal range of motion in all extremities. No joint effusions.  No lower extremity tenderness nor edema. Neurologic:  Normal speech and language. No gross focal neurologic deficits are appreciated.  Skin:  Skin is warm, dry and intact. No rash noted. Psychiatric: Mood and affect are normal. Speech and behavior are normal. Patient exhibits appropriate insight and judgment.  ____________________________________________    LABS (pertinent positives/negatives)  Labs Reviewed  COMPREHENSIVE METABOLIC PANEL - Abnormal; Notable for the following:    BUN 43 (*)    Creatinine, Ser 2.07 (*)    Calcium 8.5 (*)    AST 14 (*)    ALT 12 (*)    GFR calc non Af Amer 27 (*)    GFR calc Af Amer 31 (*)    All other components within normal limits  CBC - Abnormal; Notable for the following:    RBC 3.68 (*)    Hemoglobin 11.0 (*)    HCT 34.0 (*)    RDW 14.8 (*)    All other components within normal limits  URINALYSIS COMPLETEWITH MICROSCOPIC ([facility] ONLY) - Abnormal; Notable for the following:    Color, Urine YELLOW (*)    APPearance CLEAR (*)    All other components within normal limits  LIPASE, BLOOD  TROPONIN I     ____________________________________________   EKG  I, Nance Pear, attending physician, personally viewed and interpreted this EKG  EKG Time: 0915 Rate: 90 Rhythm: sinus rhythm with PAC in pattern of bigeminy Axis: normal Intervals: qtc 452 QRS: narrow ST changes: no st elevation Impression: abnormal ekg ____________________________________________    RADIOLOGY  CT abd/pel IMPRESSION: Bilateral nonobstructive nephrolithiasis. No evidence of ureteral calculi, hydronephrosis, or other acute findings.  No evidence of abdominal or pelvic metastatic disease.  Stable small fat containing bilateral inguinal and paraumbilical hernias.  ____________________________________________   PROCEDURES  Procedure(s)  performed: None  Critical Care performed: No  ____________________________________________   INITIAL IMPRESSION / ASSESSMENT AND PLAN / ED COURSE  Pertinent labs & imaging results that were available during my care of the patient were reviewed by me and considered in my medical decision making (see chart for details).  Patient presented to the emergency department today because of concerns for abdominal pain. The patient states it is somewhat worse with movement. CT scan did not show any acute findings. Patient might of had some minimal relief with the GI cocktail. Has already started on Zantac. This point I think additional likely diagnosis would be musculoskeletal. Will have patient follow-up  with primary care. Discussed return precautions.  ____________________________________________   FINAL CLINICAL IMPRESSION(S) / ED DIAGNOSES  Final diagnoses:  Abdominal pain, unspecified abdominal location     Nance Pear, MD 05/02/15 1531

## 2015-05-05 ENCOUNTER — Emergency Department: Payer: Medicare HMO

## 2015-05-05 ENCOUNTER — Inpatient Hospital Stay
Admission: EM | Admit: 2015-05-05 | Discharge: 2015-05-07 | DRG: 683 | Disposition: A | Discharge status: Home Health | Payer: Medicare HMO | Attending: Internal Medicine | Admitting: Internal Medicine

## 2015-05-05 DIAGNOSIS — K5900 Constipation, unspecified: Secondary | ICD-10-CM

## 2015-05-05 DIAGNOSIS — E860 Dehydration: Secondary | ICD-10-CM | POA: Diagnosis present

## 2015-05-05 DIAGNOSIS — R1013 Epigastric pain: Secondary | ICD-10-CM

## 2015-05-05 DIAGNOSIS — K219 Gastro-esophageal reflux disease without esophagitis: Secondary | ICD-10-CM | POA: Diagnosis present

## 2015-05-05 DIAGNOSIS — J449 Chronic obstructive pulmonary disease, unspecified: Secondary | ICD-10-CM | POA: Diagnosis present

## 2015-05-05 DIAGNOSIS — L03113 Cellulitis of right upper limb: Secondary | ICD-10-CM | POA: Diagnosis present

## 2015-05-05 DIAGNOSIS — Z951 Presence of aortocoronary bypass graft: Secondary | ICD-10-CM

## 2015-05-05 DIAGNOSIS — E039 Hypothyroidism, unspecified: Secondary | ICD-10-CM | POA: Diagnosis present

## 2015-05-05 DIAGNOSIS — Z87891 Personal history of nicotine dependence: Secondary | ICD-10-CM | POA: Diagnosis not present

## 2015-05-05 DIAGNOSIS — I2510 Atherosclerotic heart disease of native coronary artery without angina pectoris: Secondary | ICD-10-CM | POA: Diagnosis present

## 2015-05-05 DIAGNOSIS — L03119 Cellulitis of unspecified part of limb: Secondary | ICD-10-CM

## 2015-05-05 DIAGNOSIS — Z7982 Long term (current) use of aspirin: Secondary | ICD-10-CM | POA: Diagnosis not present

## 2015-05-05 DIAGNOSIS — K2970 Gastritis, unspecified, without bleeding: Secondary | ICD-10-CM | POA: Diagnosis present

## 2015-05-05 DIAGNOSIS — Z79899 Other long term (current) drug therapy: Secondary | ICD-10-CM

## 2015-05-05 DIAGNOSIS — M19041 Primary osteoarthritis, right hand: Secondary | ICD-10-CM | POA: Diagnosis present

## 2015-05-05 DIAGNOSIS — N189 Chronic kidney disease, unspecified: Secondary | ICD-10-CM

## 2015-05-05 DIAGNOSIS — I129 Hypertensive chronic kidney disease with stage 1 through stage 4 chronic kidney disease, or unspecified chronic kidney disease: Secondary | ICD-10-CM | POA: Diagnosis present

## 2015-05-05 DIAGNOSIS — Z8546 Personal history of malignant neoplasm of prostate: Secondary | ICD-10-CM | POA: Diagnosis not present

## 2015-05-05 DIAGNOSIS — I739 Peripheral vascular disease, unspecified: Secondary | ICD-10-CM | POA: Diagnosis present

## 2015-05-05 DIAGNOSIS — I482 Chronic atrial fibrillation: Secondary | ICD-10-CM | POA: Diagnosis present

## 2015-05-05 DIAGNOSIS — M109 Gout, unspecified: Secondary | ICD-10-CM | POA: Diagnosis present

## 2015-05-05 DIAGNOSIS — L03011 Cellulitis of right finger: Secondary | ICD-10-CM

## 2015-05-05 DIAGNOSIS — I495 Sick sinus syndrome: Secondary | ICD-10-CM | POA: Diagnosis present

## 2015-05-05 DIAGNOSIS — N200 Calculus of kidney: Secondary | ICD-10-CM | POA: Diagnosis present

## 2015-05-05 DIAGNOSIS — N184 Chronic kidney disease, stage 4 (severe): Secondary | ICD-10-CM | POA: Diagnosis present

## 2015-05-05 DIAGNOSIS — I872 Venous insufficiency (chronic) (peripheral): Secondary | ICD-10-CM | POA: Diagnosis present

## 2015-05-05 DIAGNOSIS — N179 Acute kidney failure, unspecified: Secondary | ICD-10-CM | POA: Diagnosis present

## 2015-05-05 HISTORY — DX: Chronic kidney disease, unspecified: N18.9

## 2015-05-05 HISTORY — DX: Hypothyroidism, unspecified: E03.9

## 2015-05-05 LAB — CBC WITH DIFFERENTIAL/PLATELET
BASOS ABS: 0.1 10*3/uL (ref 0–0.1)
BASOS PCT: 1 %
Eosinophils Absolute: 0.2 10*3/uL (ref 0–0.7)
Eosinophils Relative: 3 %
HEMATOCRIT: 30.7 % — AB (ref 40.0–52.0)
HEMOGLOBIN: 9.9 g/dL — AB (ref 13.0–18.0)
Lymphocytes Relative: 9 %
Lymphs Abs: 0.8 10*3/uL — ABNORMAL LOW (ref 1.0–3.6)
MCH: 29.7 pg (ref 26.0–34.0)
MCHC: 32.2 g/dL (ref 32.0–36.0)
MCV: 92.1 fL (ref 80.0–100.0)
MONO ABS: 0.7 10*3/uL (ref 0.2–1.0)
Monocytes Relative: 9 %
NEUTROS ABS: 6.7 10*3/uL — AB (ref 1.4–6.5)
NEUTROS PCT: 78 %
Platelets: 182 10*3/uL (ref 150–440)
RBC: 3.33 MIL/uL — ABNORMAL LOW (ref 4.40–5.90)
RDW: 14.8 % — AB (ref 11.5–14.5)
WBC: 8.5 10*3/uL (ref 3.8–10.6)

## 2015-05-05 LAB — COMPREHENSIVE METABOLIC PANEL
ALT: 13 U/L — ABNORMAL LOW (ref 17–63)
AST: 17 U/L (ref 15–41)
Albumin: 3.4 g/dL — ABNORMAL LOW (ref 3.5–5.0)
Alkaline Phosphatase: 61 U/L (ref 38–126)
Anion gap: 10 (ref 5–15)
BUN: 56 mg/dL — ABNORMAL HIGH (ref 6–20)
CHLORIDE: 103 mmol/L (ref 101–111)
CO2: 25 mmol/L (ref 22–32)
Calcium: 8.4 mg/dL — ABNORMAL LOW (ref 8.9–10.3)
Creatinine, Ser: 2.71 mg/dL — ABNORMAL HIGH (ref 0.61–1.24)
GFR, EST AFRICAN AMERICAN: 22 mL/min — AB (ref >60)
GFR, EST NON AFRICAN AMERICAN: 19 mL/min — AB (ref >60)
Glucose, Bld: 103 mg/dL — ABNORMAL HIGH (ref 65–99)
POTASSIUM: 3.9 mmol/L (ref 3.5–5.1)
SODIUM: 138 mmol/L (ref 135–145)
Total Bilirubin: 0.5 mg/dL (ref 0.3–1.2)
Total Protein: 7.1 g/dL (ref 6.5–8.1)

## 2015-05-05 LAB — URINALYSIS COMPLETE WITH MICROSCOPIC (ARMC ONLY)
BACTERIA UA: NONE SEEN
BILIRUBIN URINE: NEGATIVE
GLUCOSE, UA: NEGATIVE mg/dL
HGB URINE DIPSTICK: NEGATIVE
KETONES UR: NEGATIVE mg/dL
NITRITE: NEGATIVE
Protein, ur: NEGATIVE mg/dL
SPECIFIC GRAVITY, URINE: 1.012 (ref 1.005–1.030)
pH: 5 (ref 5.0–8.0)

## 2015-05-05 LAB — BRAIN NATRIURETIC PEPTIDE: B Natriuretic Peptide: 31 pg/mL (ref 0.0–100.0)

## 2015-05-05 LAB — LIPASE, BLOOD: Lipase: 25 U/L (ref 11–51)

## 2015-05-05 LAB — TROPONIN I: Troponin I: <0.03 ng/mL (ref <0.031)

## 2015-05-05 LAB — LACTIC ACID, PLASMA
LACTIC ACID, VENOUS: 1 mmol/L (ref 0.5–2.0)
LACTIC ACID, VENOUS: 1.3 mmol/L (ref 0.5–2.0)

## 2015-05-05 MED ORDER — PANTOPRAZOLE SODIUM 40 MG IV SOLR
40.0000 mg | Freq: Once | INTRAVENOUS | Status: AC
  Administered 2015-05-05: 40 mg via INTRAVENOUS
  Filled 2015-05-05: qty 40

## 2015-05-05 MED ORDER — GI COCKTAIL ~~LOC~~
30.0000 mL | Freq: Once | ORAL | Status: AC
  Administered 2015-05-05: 30 mL via ORAL
  Filled 2015-05-05: qty 30

## 2015-05-05 MED ORDER — VANCOMYCIN HCL IN DEXTROSE 1-5 GM/200ML-% IV SOLN
1000.0000 mg | Freq: Once | INTRAVENOUS | Status: AC
  Administered 2015-05-05: 1000 mg via INTRAVENOUS
  Filled 2015-05-05: qty 200

## 2015-05-05 NOTE — H&P (Signed)
[name]    MR#:  [id number]  DATE OF BIRTH:  21-Nov-1923  DATE OF ADMISSION:  05/05/2015  PRIMARY CARE PHYSICIAN: [facility]   REQUESTING/REFERRING PHYSICIAN: Dr. Meade Maw  CHIEF COMPLAINT:   Chief Complaint  Patient presents with  . Chest Pain  . Abdominal Pain    HISTORY OF PRESENT ILLNESS:  Gary Grant  is a 82 y.o. male with a known history of HTN, CAD s/p CABG, Hypothyroidism, SSS, COPD brought from home secondary to worsening abdominal pain. Symptoms started last week and he presented to the emergency room about 4 days ago with similar symptoms. He was discharged on some pain medicine that was not helping. His epigastric pain has been intermittent but had a worse episode this morning and so came to the emergency room. Denies any chest pain or palpitations or cough or difficulty breathing. No right upper quadrant or left upper quadrant pain. Pain is sharp and burning in the epigastric region and does not radiate. Denies any nausea or vomiting. Mostly drinking water in the last couple of days. Also complaining of difficulty passing urine for the last 2 days. No fevers or chills. He also has cellulitis of his right hand on the dorsal side which started about 3-4 days ago. It has been getting worse and he has trouble closing his hand now.  PAST MEDICAL HISTORY:   Past Medical History  Diagnosis Date  . Prostate cancer ([place])   . SSS (sick sinus syndrome) ([place])   . Chronic a-fib ([place])   . GERD (gastroesophageal reflux disease)   . CAD (coronary artery disease)   . PAD (peripheral artery disease) ([place])   . Venous insufficiency   . HTN (hypertension)   . COPD (chronic obstructive pulmonary disease) ([place])   . Hypothyroidism   . CKD (chronic kidney disease)     PAST SURGICAL HISTORY:   Past Surgical History  Procedure Laterality Date  . Cardiac surgery      bypass   . Fracture surgery    . Eye surgery      SOCIAL HISTORY:   Social History  Substance Use Topics  . Smoking status: Former Research scientist (life sciences)  . Smokeless tobacco: Not on file  . Alcohol Use: No    FAMILY HISTORY:   Family History  Problem Relation Age of Onset  . Osteoporosis Mother   . Heart Problems Father   . Stroke Maternal Grandfather   . Stroke Paternal Grandfather   . Stroke Paternal Grandmother     DRUG ALLERGIES:  No Known Allergies  REVIEW OF SYSTEMS:   Review of Systems  Constitutional: Negative for fever, chills, weight loss and malaise/fatigue.  HENT: Negative for ear discharge, ear pain and nosebleeds.   Eyes: Negative for blurred vision, double vision and photophobia.  Respiratory: Negative for cough, hemoptysis, shortness of breath and wheezing.   Cardiovascular: Negative for chest pain, palpitations, orthopnea and leg swelling.  Gastrointestinal: Positive for abdominal pain. Negative for heartburn, nausea, vomiting, diarrhea, constipation and melena.  Genitourinary: Negative for dysuria, urgency, frequency and hematuria.  Musculoskeletal: Negative for myalgias, back pain and neck pain.       Right hand pain  Skin: Negative for rash.  Neurological: Negative for dizziness, tingling, tremors, sensory change, speech change, focal weakness and headaches.  Endo/Heme/Allergies: Does not bruise/bleed easily.  Psychiatric/Behavioral: Negative for depression.    MEDICATIONS AT HOME:   Prior to Admission medications  Medication Sig Start Date End Date Taking? Authorizing Provider  aspirin EC 81 MG tablet Take 81 mg by mouth daily.   Yes Historical Provider, MD  atorvastatin (LIPITOR) 20 MG tablet Take 20 mg by mouth at bedtime.    Yes Historical Provider, MD  cholecalciferol (VITAMIN D) 1000 units tablet Take 1,000 Units by mouth daily.   Yes Historical Provider, MD  enalapril (VASOTEC) 5 MG tablet Take 5 mg by mouth daily.   Yes Historical Provider, MD  furosemide  (LASIX) 40 MG tablet Take 40 mg by mouth 2 (two) times daily as needed for edema.    Yes Historical Provider, MD  levothyroxine (SYNTHROID, LEVOTHROID) 25 MCG tablet Take 25 mcg by mouth daily before breakfast.   Yes Historical Provider, MD  metoprolol succinate (TOPROL-XL) 25 MG 24 hr tablet Take 12.5 mg by mouth at bedtime.    Yes Historical Provider, MD  sucralfate (CARAFATE) 1 g tablet Take 1 tablet (1 g total) by mouth 4 (four) times daily. 05/02/15  Yes Nance Pear, MD  tamsulosin (FLOMAX) 0.4 MG CAPS capsule Take 0.4 mg by mouth daily.   Yes Historical Provider, MD      VITAL SIGNS:  Blood pressure 123/54, pulse 89, temperature 98.3 F (36.8 C), temperature source Oral, resp. rate 18, height 5\' 11"  (1.803 m), weight 122.471 kg (270 lb), SpO2 94 %.  PHYSICAL EXAMINATION:   Physical Exam  GENERAL:  82 y.o.-year-old elderly patient lying in the bed with no acute distress.  EYES: Pupils equal, round, reactive to light and accommodation. No scleral icterus. Extraocular muscles intact.  HEENT: Head atraumatic, normocephalic. Oropharynx and nasopharynx clear.  NECK:  Supple, no jugular venous distention. No thyroid enlargement, no tenderness.  LUNGS: Normal breath sounds bilaterally, no wheezing, rales,rhonchi or crepitation. No use of accessory muscles of respiration.  CARDIOVASCULAR: S1, S2 normal. No rubs, or gallops. 3/6 systolic murmur present. ABDOMEN: Soft, nontender, nondistended. Bowel sounds present. No organomegaly or mass.  EXTREMITIES: No pedal edema, cyanosis, or clubbing. Right hand 4th digit erythema and swelling spreading onto the dorsum of the hand, tender to touch. NEUROLOGIC: Cranial nerves II through XII are intact. Muscle strength 5/5 in all extremities. Sensation intact. Gait not checked.  PSYCHIATRIC: The patient is alert and oriented x 3.  SKIN: No obvious rash, lesion, or ulcer.   LABORATORY PANEL:   CBC  Recent Labs Lab 05/05/15 1414  WBC 8.5  HGB 9.9*   HCT 30.7*  PLT 182   ------------------------------------------------------------------------------------------------------------------  Chemistries   Recent Labs Lab 05/05/15 1414  NA 138  K 3.9  CL 103  CO2 25  GLUCOSE 103*  BUN 56*  CREATININE 2.71*  CALCIUM 8.4*  AST 17  ALT 13*  ALKPHOS 61  BILITOT 0.5   ------------------------------------------------------------------------------------------------------------------  Cardiac Enzymes  Recent Labs Lab 05/05/15 1414  TROPONINI <0.03   ------------------------------------------------------------------------------------------------------------------  RADIOLOGY:  Dg Chest 2 View  05/05/2015  CLINICAL DATA:  Chronic atrial fibrillation EXAM: CHEST  2 VIEW COMPARISON:  05/30/2013 FINDINGS: Previous median sternotomy and CABG procedure. The heart size is mildly enlarged. There is no pleural effusion or edema. Lung volumes appear low. No airspace consolidation. IMPRESSION: 1. Cardiac enlargement. 2. No heart failure. Electronically Signed   By: Kerby Moors M.D.   On: 05/05/2015 15:05   Dg Abd Acute W/chest  05/05/2015  CLINICAL DATA:  Epigastric pain, chest pain for about 2 weeks EXAM: DG ABDOMEN ACUTE W/ 1V CHEST COMPARISON:  05/05/2015 and CT scan 05/02/2015 FINDINGS: Cardiomediastinal  silhouette is stable. No acute infiltrate or pleural effusion. No pulmonary edema. Status post median sternotomy again noted. Stable left basilar atelectasis or scarring. There is normal small bowel gas pattern. Contrast material from recent CT scan noted throughout the colon. No evidence of free abdominal air. Pelvic phleboliths are noted. IMPRESSION: Negative abdominal radiographs. No acute cardiopulmonary disease. Contrast material from recent CT scan noted throughout the colon. Electronically Signed   By: Lahoma Crocker M.D.   On: 05/05/2015 19:13   Dg Hand Complete Right  05/05/2015  CLINICAL DATA:  Right hand swelling and redness since  05/02/2015. No known injury. Initial encounter. EXAM: RIGHT HAND - COMPLETE 3+ VIEW COMPARISON:  None. FINDINGS: No acute bony or joint abnormality is identified. Osteoarthritis about the fingers appears worst at the DIP joint of the index finger. Marked joint space narrowing is also seen scaphoid trapezium trapezoid joint. No radiopaque foreign body or soft tissue gas collection is identified. No abnormal soft tissue calcification is seen. IMPRESSION: No acute abnormality. Multifocal osteoarthritis. Electronically Signed   By: Inge Rise M.D.   On: 05/05/2015 19:12   US Abdomen Limited Ruq  05/05/2015  CLINICAL DATA:  Epigastric pain for 1 week EXAM: US ABDOMEN LIMITED - RIGHT UPPER QUADRANT COMPARISON:  CT scan 05/02/2015 FINDINGS: Gallbladder: No gallstones or wall thickening visualized. No sonographic Murphy sign noted by sonographer. Common bile duct: Diameter: 3.3 mm in diameter within normal limits. Liver: No focal lesion identified. Mild increased echogenicity of the liver suspicious for fatty infiltration. IMPRESSION: No gallstones are noted within gallbladder. Normal CBD. Mild increased echogenicity of the liver suspicious for fatty infiltration. Electronically Signed   By: Lahoma Crocker M.D.   On: 05/05/2015 19:32    EKG:   Orders placed or performed during the hospital encounter of 05/05/15  . ED EKG  . ED EKG    IMPRESSION AND PLAN:   Gary Grant  is a 82 y.o. male with a known history of HTN, CAD s/p CABG, Hypothyroidism, SSS, COPD brought from home secondary to worsening abdominal pain.  #1 epigastric pain-could be gastritis. Ultrasound of the liver showing no gallstones in gallbladder. Normal CBD. Findings suspicious for fatty infiltration of liver but no acute findings noted. Check lipase. -If lipase is elevated, then will need to do a CT abdomen to rule out pancreatitis. -IV Protonix for now. Continue sucralfate. If no improvement, we will get GI consult.  #2 benign  prostatitic hypertrophy-complains of trouble with urination. Likely from prerenal causes. -Bladder scan done now and showing only 160 cc of urine in the bladder after voiding. -Continue Flomax and monitor for now. Gentle hydration.  #3 right hand cellulitis-hand x-ray without any abnormalities. -On vancomycin and Unasyn for now. Keep the hand elevated.  #4 acute renal failure on CKD-baseline creatinine seems to be around 2. Now elevated. -Hold nephrotoxins. Gentle hydration. Check renal ultrasound. -Continue to monitor  #5 hypertension- continue home medications. On metoprolol and enalapril  #6 DVT prophylaxis-subcutaneous heparin   All the records are reviewed and case discussed with ED provider. Management plans discussed with the patient, family and they are in agreement.  CODE STATUS: Full Code  TOTAL TIME TAKING CARE OF THIS PATIENT: 50 minutes.    Gladstone Lighter M.D on 05/05/2015 at 10:03 PM  Between 7am to 6pm - Pager - [phone]  After 6pm go to [email or web address] - password [email or web address] [facility]  Office  [phone]  CC: Primary care physician; [facility]

## 2015-05-05 NOTE — ED Notes (Addendum)
Pt states he was seen here Sunday for the same sx.. States the medication is not helping.. States he has been having intermittent epigastric/chest pain for the past 2 weeks.. Pt also c/o right hand swelling and redness since Sunday..denies N/V/D/fever...pt also c/o having a hard time passing urine, states he has to strain to pass urine

## 2015-05-05 NOTE — ED Provider Notes (Signed)
Time Seen: Approximately 1800  I have reviewed the triage notes  Chief Complaint: Chest Pain and Abdominal Pain   History of Present Illness: Gary Grant is a 82 y.o. male who was seen here on Sunday for basically the same complaint of epigastric abdominal pain. Patient had a workup at that time which included laboratory work and abdominal pelvic CT which did not show any significant abnormalities. Patient states that the medication and he's been taking it home has not helped and his pain is increased since his last visit. He denies any chest pain and points exclusively to the epigastric region. He denies any melena or hematochezia. He states he's had a decreased appetite but no persistent vomiting. Pain seems to come in waves and is crampy in nature. Denies any lower abdominal pain. He has a known history of an umbilical hernia which she states is slightly more evident but is not having any pain over this area. Patient has a secondary complaint of some redness and swelling in his right hand and points primarily to the ring finger on his right hand and is noticed some increased swelling and discomfort. She has difficulty flexing and extending his right hand. He denies any other joint pain or swelling.   Past Medical History  Diagnosis Date  . Prostate cancer ([place])   . SSS (sick sinus syndrome) ([place])   . Chronic a-fib ([place])   . GERD (gastroesophageal reflux disease)   . CAD (coronary artery disease)   . PAD (peripheral artery disease) ([place])   . Venous insufficiency   . HTN (hypertension)   . COPD (chronic obstructive pulmonary disease) ([place])   . Hypothyroidism   . CKD (chronic kidney disease)     Patient Active Problem List   Diagnosis Date Noted  . ARF (acute renal failure) ([place]) 05/05/2015    Past Surgical History  Procedure Laterality Date  . Cardiac surgery      bypass  . Fracture surgery    . Eye surgery      Past Surgical History  Procedure Laterality Date  .  Cardiac surgery      bypass  . Fracture surgery    . Eye surgery      Current Outpatient Rx  Name  Route  Sig  Dispense  Refill  . aspirin EC 81 MG tablet   Oral   Take 81 mg by mouth daily.         Marland Kitchen atorvastatin (LIPITOR) 20 MG tablet   Oral   Take 20 mg by mouth at bedtime.          . cholecalciferol (VITAMIN D) 1000 units tablet   Oral   Take 1,000 Units by mouth daily.         . enalapril (VASOTEC) 5 MG tablet   Oral   Take 5 mg by mouth daily.         . furosemide (LASIX) 40 MG tablet   Oral   Take 40 mg by mouth 2 (two) times daily as needed for edema.          Marland Kitchen levothyroxine (SYNTHROID, LEVOTHROID) 25 MCG tablet   Oral   Take 25 mcg by mouth daily before breakfast.         . metoprolol succinate (TOPROL-XL) 25 MG 24 hr tablet   Oral   Take 12.5 mg by mouth at bedtime.          . sucralfate (CARAFATE) 1 g tablet   Oral  Take 1 tablet (1 g total) by mouth 4 (four) times daily.   40 tablet   0   . tamsulosin (FLOMAX) 0.4 MG CAPS capsule   Oral   Take 0.4 mg by mouth daily.           Allergies:  Review of patient's allergies indicates no known allergies.  Family History: Family History  Problem Relation Age of Onset  . Osteoporosis Mother   . Heart Problems Father   . Stroke Maternal Grandfather   . Stroke Paternal Grandfather   . Stroke Paternal Grandmother     Social History: Social History  Substance Use Topics  . Smoking status: Former Research scientist (life sciences)  . Smokeless tobacco: None  . Alcohol Use: No     Review of Systems:   10 point review of systems was performed and was otherwise negative:  Constitutional: No fever Eyes: No visual disturbances ENT: No sore throat, ear pain Cardiac: No chest painHe denies any arm job back or flank discomfort Respiratory: No shortness of breath, wheezing, or stridor Abdomen: Abdominal pain mentioned above without diarrhea, melena or hematochezia Endocrine: No weight loss, No night  sweats Extremities: No peripheral edema, cyanosis Skin: No rashes, easy bruising Neurologic: No focal weakness, trouble with speech or swollowing Urologic: No dysuria, Hematuria, or urinary frequency   Physical Exam:  ED Triage Vitals  Enc Vitals Group     BP 05/05/15 1405 104/81 mmHg     Pulse Rate 05/05/15 1405 98     Resp 05/05/15 1405 20     Temp 05/05/15 1405 98.3 F (36.8 C)     Temp Source 05/05/15 1405 Oral     SpO2 05/05/15 1405 95 %     Weight 05/05/15 1405 270 lb (122.471 kg)     Height 05/05/15 1405 5\' 11"  (1.803 m)     Head Cir --      Peak Flow --      Pain Score 05/05/15 1406 10     Pain Loc --      Pain Edu? --      Excl. in [place]? --     General: Awake , Alert , and Oriented times 3; GCS 15 Head: Normal cephalic , atraumatic Eyes: Pupils equal , round, reactive to light Nose/Throat: No nasal drainage, patent upper airway without erythema or exudate.  Neck: Supple, Full range of motion, No anterior adenopathy or palpable thyroid masses Lungs: Clear to ascultation without wheezes , rhonchi, or rales Heart: Regular rate, regular rhythm without murmurs , gallops , or rubs Abdomen: Abdomen is tender in the epigastric area with no Murphy sign. No palpable masses or organosplenomegaly. No reproducible lower abdominal pain. His umbilical hernia is easily reducible and does not appear to show any evidence of entrapment or strangulation      Extremities: Examination of his index finger shows it in a flexed position with circumferential edema and erythema around the proximal joint extending into the posterior surface of his right hand he does have limited range of motion secondary to pain and swelling otherwise extremity appears to be neurovascularly intact Neurologic: normal ambulation, Motor symmetric without deficits, sensory intact Skin: warm, dry, no rashes Rectal exam: Guaiac negative with normal sphincter tone and no palpable masses in the rectal vault  Labs:    All laboratory work was reviewed including any pertinent negatives or positives listed below:  Labs Reviewed  COMPREHENSIVE METABOLIC PANEL - Abnormal; Notable for the following:    Glucose, Bld 103 (*)  BUN 56 (*)    Creatinine, Ser 2.71 (*)    Calcium 8.4 (*)    Albumin 3.4 (*)    ALT 13 (*)    GFR calc non Af Amer 19 (*)    GFR calc Af Amer 22 (*)    All other components within normal limits  CBC WITH DIFFERENTIAL/PLATELET - Abnormal; Notable for the following:    RBC 3.33 (*)    Hemoglobin 9.9 (*)    HCT 30.7 (*)    RDW 14.8 (*)    Neutro Abs 6.7 (*)    Lymphs Abs 0.8 (*)    All other components within normal limits  URINALYSIS COMPLETEWITH MICROSCOPIC ([facility] ONLY) - Abnormal; Notable for the following:    Color, Urine YELLOW (*)    APPearance CLEAR (*)    Leukocytes, UA TRACE (*)    Squamous Epithelial / LPF 0-5 (*)    All other components within normal limits  CULTURE, BLOOD (ROUTINE X 2)  CULTURE, BLOOD (ROUTINE X 2)  LIPASE, BLOOD  BRAIN NATRIURETIC PEPTIDE  TROPONIN I  LACTIC ACID, PLASMA  LACTIC ACID, PLASMA  Laboratory work showed a decreasing hemoglobin and increasing creatinine levels.  EKG: * ED ECG REPORT I, Daymon Larsen, the attending physician, personally viewed and interpreted this ECG.  Date: 05/05/2015 EKG Time: 1418 Rate: 102 Rhythm: normal sinus rhythm QRS Axis: normal Intervals: normal ST/T Wave abnormalities: Nonspecific ST-T wave abnormality Conduction Disturbances: none Narrative Interpretation: unremarkable   Radiology:   EXAM: US ABDOMEN LIMITED - RIGHT UPPER QUADRANT  COMPARISON: CT scan 05/02/2015  FINDINGS: Gallbladder:  No gallstones or wall thickening visualized. No sonographic Murphy sign noted by sonographer.  Common bile duct:  Diameter: 3.3 mm in diameter within normal limits.  Liver:  No focal lesion identified. Mild increased echogenicity of the liver suspicious for fatty  infiltration.  IMPRESSION: No gallstones are noted within gallbladder. Normal CBD. Mild increased echogenicity of the liver suspicious for fatty infiltration.    DG Hand Complete Right (Final result) Result time: 05/05/15 19:12:28   Final result by Rad Results In Interface (05/05/15 19:12:28)   Narrative:   CLINICAL DATA: Right hand swelling and redness since 05/02/2015. No known injury. Initial encounter.  EXAM: RIGHT HAND - COMPLETE 3+ VIEW  COMPARISON: None.  FINDINGS: No acute bony or joint abnormality is identified. Osteoarthritis about the fingers appears worst at the DIP joint of the index finger. Marked joint space narrowing is also seen scaphoid trapezium trapezoid joint. No radiopaque foreign body or soft tissue gas collection is identified. No abnormal soft tissue calcification is seen.  IMPRESSION: No acute abnormality.  Multifocal osteoarthritis.   Electronically Signed By: Inge Rise M.D. On: 05/05/2015 19:12          DG Abd Acute W/Chest (Final result) Result time: 05/05/15 19:13:07   Final result by Rad Results In Interface (05/05/15 19:13:07)   Narrative:   CLINICAL DATA: Epigastric pain, chest pain for about 2 weeks  EXAM: DG ABDOMEN ACUTE W/ 1V CHEST  COMPARISON: 05/05/2015 and CT scan 05/02/2015  FINDINGS: Cardiomediastinal silhouette is stable. No acute infiltrate or pleural effusion. No pulmonary edema. Status post median sternotomy again noted. Stable left basilar atelectasis or scarring.  There is normal small bowel gas pattern. Contrast material from recent CT scan noted throughout the colon. No evidence of free abdominal air. Pelvic phleboliths are noted.  IMPRESSION: Negative abdominal radiographs. No acute cardiopulmonary disease. Contrast material from recent CT scan noted throughout the colon.  Electronically Signed By: Lahoma Crocker M.D. On: 05/05/2015 19:13          DG Chest 2 View (Final  result) Result time: 05/05/15 15:05:09   Final result by Rad Results In Interface (05/05/15 15:05:09)   Narrative:   CLINICAL DATA: Chronic atrial fibrillation  EXAM: CHEST 2 VIEW  COMPARISON: 05/30/2013  FINDINGS: Previous median sternotomy and CABG procedure. The heart size is mildly enlarged. There is no pleural effusion or edema. Lung volumes appear low. No airspace consolidation.  IMPRESSION: 1. Cardiac enlargement. 2. No heart failure.   Electronically Signed By: Kerby Moors M.D. On: 05/05/2015 15:05           I personally reviewed the radiologic studies   ED Course:  Patient's stay here was uneventful he did have some mild relief with GI cocktail. I did start him up on some IV proton next for what I thought may be peptic ulcer disease with his decreased appetite etc. His hemoglobin is decreased though he is currently guaiac negative and he is hemodynamically stable and I'm not sure how active GI bleeding may have. The patient also appears to have a finger cellulitis with possible deeper tissue involvement. Concern is that he may have involvement of the extensor tendon on that finger. The patient was initiated on IV antibiotics.    Assessment:  Cellulitis right hand Epigastric abdominal pain*   Final Clinical Impression: *  Final diagnoses:  Epigastric abdominal pain  Cellulitis of finger of right hand     Plan: Inpatient management Patient was advised to return immediately if condition worsens. Patient was advised to follow up with their primary care physician or other specialized physicians involved in their outpatient care            Daymon Larsen, MD 05/05/15 2326

## 2015-05-06 ENCOUNTER — Inpatient Hospital Stay: Payer: Medicare HMO

## 2015-05-06 LAB — BASIC METABOLIC PANEL
Anion gap: 7 (ref 5–15)
BUN: 51 mg/dL — AB (ref 6–20)
CHLORIDE: 105 mmol/L (ref 101–111)
CO2: 27 mmol/L (ref 22–32)
CREATININE: 2.43 mg/dL — AB (ref 0.61–1.24)
Calcium: 8.5 mg/dL — ABNORMAL LOW (ref 8.9–10.3)
GFR, EST AFRICAN AMERICAN: 26 mL/min — AB (ref >60)
GFR, EST NON AFRICAN AMERICAN: 22 mL/min — AB (ref >60)
Glucose, Bld: 90 mg/dL (ref 65–99)
Potassium: 3.8 mmol/L (ref 3.5–5.1)
SODIUM: 139 mmol/L (ref 135–145)

## 2015-05-06 LAB — URIC ACID: Uric Acid, Serum: 10.9 mg/dL — ABNORMAL HIGH (ref 4.4–7.6)

## 2015-05-06 MED ORDER — ACETAMINOPHEN 650 MG RE SUPP: 650.0000 mg | Freq: Four times a day (QID) | RECTAL | Status: DC | PRN

## 2015-05-06 MED ORDER — ONDANSETRON HCL 4 MG PO TABS: 4.0000 mg | ORAL_TABLET | Freq: Four times a day (QID) | ORAL | Status: DC | PRN

## 2015-05-06 MED ORDER — SUCRALFATE 1 G PO TABS
1.0000 g | ORAL_TABLET | Freq: Four times a day (QID) | ORAL | Status: DC
  Administered 2015-05-06 – 2015-05-07 (×5): 1 g via ORAL
  Filled 2015-05-06 (×5): qty 1

## 2015-05-06 MED ORDER — PREDNISONE 10 MG PO TABS
50.0000 mg | ORAL_TABLET | Freq: Every day | ORAL | Status: DC
  Administered 2015-05-06 – 2015-05-07 (×2): 50 mg via ORAL
  Filled 2015-05-06 (×2): qty 2

## 2015-05-06 MED ORDER — HEPARIN SODIUM (PORCINE) 5000 UNIT/ML IJ SOLN
5000.0000 [IU] | Freq: Three times a day (TID) | INTRAMUSCULAR | Status: DC
  Administered 2015-05-06 – 2015-05-07 (×5): 5000 [IU] via SUBCUTANEOUS
  Filled 2015-05-06 (×5): qty 1

## 2015-05-06 MED ORDER — ACETAMINOPHEN 325 MG PO TABS
650.0000 mg | ORAL_TABLET | Freq: Four times a day (QID) | ORAL | Status: DC | PRN
  Administered 2015-05-06: 650 mg via ORAL
  Filled 2015-05-06: qty 2

## 2015-05-06 MED ORDER — ONDANSETRON HCL 4 MG/2ML IJ SOLN: 4.0000 mg | Freq: Four times a day (QID) | INTRAMUSCULAR | Status: DC | PRN

## 2015-05-06 MED ORDER — VITAMIN D 1000 UNITS PO TABS
1000.0000 [IU] | ORAL_TABLET | Freq: Every day | ORAL | Status: DC
  Administered 2015-05-06 – 2015-05-07 (×2): 1000 [IU] via ORAL
  Filled 2015-05-06 (×2): qty 1

## 2015-05-06 MED ORDER — TAMSULOSIN HCL 0.4 MG PO CAPS
0.4000 mg | ORAL_CAPSULE | Freq: Every day | ORAL | Status: DC
  Administered 2015-05-06 – 2015-05-07 (×2): 0.4 mg via ORAL
  Filled 2015-05-06 (×2): qty 1

## 2015-05-06 MED ORDER — DOCUSATE SODIUM 100 MG PO CAPS
100.0000 mg | ORAL_CAPSULE | Freq: Two times a day (BID) | ORAL | Status: DC
  Administered 2015-05-06 – 2015-05-07 (×2): 100 mg via ORAL
  Filled 2015-05-06 (×2): qty 1

## 2015-05-06 MED ORDER — SODIUM CHLORIDE 0.9 % IV SOLN: 1250.0000 mg | INTRAVENOUS | Status: DC

## 2015-05-06 MED ORDER — METOPROLOL SUCCINATE ER 25 MG PO TB24
12.5000 mg | ORAL_TABLET | Freq: Every day | ORAL | Status: DC
  Administered 2015-05-06 (×2): 12.5 mg via ORAL
  Filled 2015-05-06 (×2): qty 1

## 2015-05-06 MED ORDER — PANTOPRAZOLE SODIUM 40 MG IV SOLR
40.0000 mg | Freq: Two times a day (BID) | INTRAVENOUS | Status: DC
  Administered 2015-05-06 (×3): 40 mg via INTRAVENOUS
  Filled 2015-05-06 (×3): qty 40

## 2015-05-06 MED ORDER — ENALAPRIL MALEATE 5 MG PO TABS
5.0000 mg | ORAL_TABLET | Freq: Every day | ORAL | Status: DC
  Administered 2015-05-06 – 2015-05-07 (×2): 5 mg via ORAL
  Filled 2015-05-06 (×2): qty 1

## 2015-05-06 MED ORDER — VANCOMYCIN HCL 10 G IV SOLR
1500.0000 mg | INTRAVENOUS | Status: DC
  Administered 2015-05-06: 1500 mg via INTRAVENOUS
  Filled 2015-05-06: qty 1500

## 2015-05-06 MED ORDER — SODIUM CHLORIDE 0.9 % IV SOLN
3.0000 g | Freq: Two times a day (BID) | INTRAVENOUS | Status: DC
  Administered 2015-05-06 – 2015-05-07 (×3): 3 g via INTRAVENOUS
  Filled 2015-05-06 (×4): qty 3

## 2015-05-06 MED ORDER — SODIUM CHLORIDE 0.9 % IV SOLN
INTRAVENOUS | Status: AC
  Administered 2015-05-06 (×2): via INTRAVENOUS

## 2015-05-06 MED ORDER — ATORVASTATIN CALCIUM 20 MG PO TABS
20.0000 mg | ORAL_TABLET | Freq: Every day | ORAL | Status: DC
  Administered 2015-05-06 (×2): 20 mg via ORAL
  Filled 2015-05-06 (×2): qty 1

## 2015-05-06 MED ORDER — LEVOTHYROXINE SODIUM 50 MCG PO TABS
25.0000 ug | ORAL_TABLET | Freq: Every day | ORAL | Status: DC
  Administered 2015-05-06 – 2015-05-07 (×2): 25 ug via ORAL
  Filled 2015-05-06 (×2): qty 1

## 2015-05-06 MED ORDER — SENNA 8.6 MG PO TABS
1.0000 | ORAL_TABLET | Freq: Every day | ORAL | Status: DC | PRN
  Administered 2015-05-07: 10:00:00 8.6 mg via ORAL
  Filled 2015-05-06: qty 1

## 2015-05-06 NOTE — Progress Notes (Signed)
Pharmacy Antibiotic Note  Gary Grant is a 82 y.o. male admitted on 05/05/2015 with cellulitis.  Pharmacy has been consulted for vancomycin and Unasyn dosing.  DW 94kg  Vd 66L  kei 0.025 hr-1  T1/2 28 hours  Vancomycin 1500 mg q 36 hours ordered. Level before 4th overall dose.    Plan: Goal trough is 10-15 for cellulitis. Imaging does not indicate any bone involvement, patient is obese with BMI of 35, and Scr is 2.71 (baseline 2)   Will adjust vancomycin to 1250mg  q36 hours. Calculated trough a Css is 13.   Trough ordered prior to 4th dose.  Will continue Unasyn 3 grams q 12 hours ordered.  Height: 5\' 11"  (180.3 cm) Weight: 263 lb 9.6 oz (119.568 kg) IBW/kg (Calculated) : 75.3  Temp (24hrs), Avg:98.1 F (36.7 C), Min:97.9 F (36.6 C), Max:98.3 F (36.8 C)   Recent Labs Lab 05/02/15 0927 05/05/15 1414 05/05/15 2017 05/06/15 0752  WBC 7.9 8.5  --   --   CREATININE 2.07* 2.71*  --  2.43*  LATICACIDVEN  --  1.0 1.3  --     Estimated Creatinine Clearance: 27.1 mL/min (by C-G formula based on Cr of 2.43).    No Known Allergies  Antimicrobials this admission:   >>    >>   Dose adjustments this admission:   Microbiology results: 3/8 BCx: pending  3/8 UA: (-)  Thank you for allowing pharmacy to be a part of this patient's care.  Nancy Fetter, PharmD Pharmacy Resident  05/06/2015 9:56 AM

## 2015-05-06 NOTE — Progress Notes (Signed)
Pharmacy Antibiotic Note  Gary Grant is a 82 y.o. male admitted on 05/05/2015 with cellulitis.  Pharmacy has been consulted for vancomycin and Unasyn dosing.  Plan: DW 94kg  Vd 66L kei 0.025 hr-1  T1/2 28 hours Vancomycin 1500 mg q 36 hours ordered. Level before 4th overall dose. Goal 15-20.  Unasyn 3 grams q 12 hours ordered.  Height: 5\' 11"  (180.3 cm) Weight: 263 lb 9.6 oz (119.568 kg) IBW/kg (Calculated) : 75.3  Temp (24hrs), Avg:98.2 F (36.8 C), Min:98 F (36.7 C), Max:98.3 F (36.8 C)   Recent Labs Lab 05/02/15 0927 05/05/15 1414 05/05/15 2017  WBC 7.9 8.5  --   CREATININE 2.07* 2.71*  --   LATICACIDVEN  --  1.0 1.3    Estimated Creatinine Clearance: 24.3 mL/min (by C-G formula based on Cr of 2.71).    No Known Allergies  Antimicrobials this admission:   >>    >>   Dose adjustments this admission:   Microbiology results: 3/8 BCx: pending  3/8 UA: (-)  Thank you for allowing pharmacy to be a part of this patient's care.  [name] 05/06/2015 2:26 AM

## 2015-05-06 NOTE — Progress Notes (Signed)
[facility] [place] NAME: Gary Grant    MR#:  [id number]  DATE OF BIRTH:  18-Aug-1923  SUBJECTIVE:  CHIEF COMPLAINT:   Chief Complaint  Patient presents with  . Chest Pain  . Abdominal Pain   patient is a 82 year old male with past medical history significant history of multiple medical problems including essential hypertension, coronary artery disease, coronary bypass grafting, hypothyroidism, sick sinus syndrome, COPD who presents to the hospital with complaints of 4 day history abdominal pain. Pain was described as burning sensation in the epigastric area, patient was also complaining of difficulty urinating. On arrival to emergency room, he was noted to have a right hand cellulitis. He was initiated on vancomycin, right hand redness improved, although patient still complains of significant swelling in the joint area and pain on manipulation. Abdominal pain has subsided. Recent CT scan was unremarkable except (nephrolithiasis. Renal ultrasound was unremarkable, a abdominal ultrasound of right upper quadrant was unremarkable. Uric acid level was found to be elevated above 10 and patient was initiated on prednisone. Patient's kidney function was found to be abnormal. On arrival to emergency room, impaired, more so than usual with creatinine level of 2.71, patient was given IV fluids and his creatinine improved to 2.43 today.   ROS  VITAL SIGNS: Blood pressure 136/71, pulse 111, temperature 97.9 F (36.6 C), temperature source Oral, resp. rate 20, height 5\' 11"  (1.803 m), weight 119.568 kg (263 lb 9.6 oz), SpO2 96 %.  PHYSICAL EXAMINATION:   GENERAL:  82 y.o.-year-old patient lying in the bed with no acute distress.  EYES: Pupils equal, round, reactive to light and accommodation. No scleral icterus. Extraocular muscles intact.  HEENT: Head atraumatic, normocephalic. Oropharynx and nasopharynx clear.  NECK:  Supple, no jugular venous  distention. No thyroid enlargement, no tenderness.  LUNGS: Normal breath sounds bilaterally, no wheezing, rales,rhonchi or crepitation. No use of accessory muscles of respiration.  CARDIOVASCULAR: S1, S2 normal. No murmurs, rubs, or gallops.  ABDOMEN: Soft, nontender, nondistended. Bowel sounds present. No organomegaly or mass.  EXTREMITIES: No pedal edema, cyanosis, or clubbing. Right hand swelling at the MCP joint, erythema with increased warmth of the rectal aspect of the right hand. Significant tenderness to palpation and pain on range of motion.  NEUROLOGIC: Cranial nerves II through XII are intact. Muscle strength 5/5 in all extremities. Sensation intact. Gait not checked.  PSYCHIATRIC: The patient is alert and oriented x 3.  SKIN: No obvious rash, lesion, or ulcer.   ORDERS/RESULTS REVIEWED:   CBC  Recent Labs Lab 05/02/15 0927 05/05/15 1414  WBC 7.9 8.5  HGB 11.0* 9.9*  HCT 34.0* 30.7*  PLT 179 182  MCV 92.3 92.1  MCH 30.0 29.7  MCHC 32.5 32.2  RDW 14.8* 14.8*  LYMPHSABS  --  0.8*  MONOABS  --  0.7  EOSABS  --  0.2  BASOSABS  --  0.1   ------------------------------------------------------------------------------------------------------------------  Chemistries   Recent Labs Lab 05/02/15 0927 05/05/15 1414 05/06/15 0752  NA 144 138 139  K 4.5 3.9 3.8  CL 107 103 105  CO2 31 25 27   GLUCOSE 99 103* 90  BUN 43* 56* 51*  CREATININE 2.07* 2.71* 2.43*  CALCIUM 8.5* 8.4* 8.5*  AST 14* 17  --   ALT 12* 13*  --   ALKPHOS 84 61  --   BILITOT 0.7 0.5  --    ------------------------------------------------------------------------------------------------------------------ estimated creatinine clearance is 27.1 mL/min (by C-G formula based on  Cr of 2.43). ------------------------------------------------------------------------------------------------------------------ No results for input(s): TSH, T4TOTAL, T3FREE, THYROIDAB in the last 72 hours.  Invalid input(s):  FREET3  Cardiac Enzymes  Recent Labs Lab 05/02/15 0927 05/05/15 1414  TROPONINI <0.03 <0.03   ------------------------------------------------------------------------------------------------------------------ Invalid input(s): POCBNP ---------------------------------------------------------------------------------------------------------------  RADIOLOGY: Dg Chest 2 View  05/05/2015  CLINICAL DATA:  Chronic atrial fibrillation EXAM: CHEST  2 VIEW COMPARISON:  05/30/2013 FINDINGS: Previous median sternotomy and CABG procedure. The heart size is mildly enlarged. There is no pleural effusion or edema. Lung volumes appear low. No airspace consolidation. IMPRESSION: 1. Cardiac enlargement. 2. No heart failure. Electronically Signed   By: Kerby Moors M.D.   On: 05/05/2015 15:05   US Renal  05/06/2015  CLINICAL DATA:  Acute renal failure EXAM: RENAL / URINARY TRACT ULTRASOUND COMPLETE COMPARISON:  Multiple prior studies FINDINGS: Right Kidney: Length: 12 cm. Lateral interpolar cyst measures 14 x 12 x 14 mm. This is stable from 03/16/2010. Echogenicity is normal. There is no hydronephrosis. Left Kidney: Length: 13.2 cm. Echogenicity is normal and there is no hydronephrosis. There is an 11 mm upper pole stone and a 7 mm mid pole stone. Bladder: Normal for degree of distention IMPRESSION: No acute abnormalities. Normal renal echogenicity with no hydronephrosis. Nonobstructing left calculi. Electronically Signed   By: Skipper Cliche M.D.   On: 05/06/2015 13:38   Dg Abd Acute W/chest  05/05/2015  CLINICAL DATA:  Epigastric pain, chest pain for about 2 weeks EXAM: DG ABDOMEN ACUTE W/ 1V CHEST COMPARISON:  05/05/2015 and CT scan 05/02/2015 FINDINGS: Cardiomediastinal silhouette is stable. No acute infiltrate or pleural effusion. No pulmonary edema. Status post median sternotomy again noted. Stable left basilar atelectasis or scarring. There is normal small bowel gas pattern. Contrast material from recent CT  scan noted throughout the colon. No evidence of free abdominal air. Pelvic phleboliths are noted. IMPRESSION: Negative abdominal radiographs. No acute cardiopulmonary disease. Contrast material from recent CT scan noted throughout the colon. Electronically Signed   By: Lahoma Crocker M.D.   On: 05/05/2015 19:13   Dg Hand Complete Right  05/05/2015  CLINICAL DATA:  Right hand swelling and redness since 05/02/2015. No known injury. Initial encounter. EXAM: RIGHT HAND - COMPLETE 3+ VIEW COMPARISON:  None. FINDINGS: No acute bony or joint abnormality is identified. Osteoarthritis about the fingers appears worst at the DIP joint of the index finger. Marked joint space narrowing is also seen scaphoid trapezium trapezoid joint. No radiopaque foreign body or soft tissue gas collection is identified. No abnormal soft tissue calcification is seen. IMPRESSION: No acute abnormality. Multifocal osteoarthritis. Electronically Signed   By: Inge Rise M.D.   On: 05/05/2015 19:12   US Abdomen Limited Ruq  05/05/2015  CLINICAL DATA:  Epigastric pain for 1 week EXAM: US ABDOMEN LIMITED - RIGHT UPPER QUADRANT COMPARISON:  CT scan 05/02/2015 FINDINGS: Gallbladder: No gallstones or wall thickening visualized. No sonographic Murphy sign noted by sonographer. Common bile duct: Diameter: 3.3 mm in diameter within normal limits. Liver: No focal lesion identified. Mild increased echogenicity of the liver suspicious for fatty infiltration. IMPRESSION: No gallstones are noted within gallbladder. Normal CBD. Mild increased echogenicity of the liver suspicious for fatty infiltration. Electronically Signed   By: Lahoma Crocker M.D.   On: 05/05/2015 19:32    EKG:  Orders placed or performed during the hospital encounter of 05/05/15  . ED EKG  . ED EKG    ASSESSMENT AND PLAN:  Active Problems:   ARF (acute renal failure) ([place])  #  1. Epigastric abdominal pain of unclear etiology, likely gastritis, continue patient on PPI, Carafate,  follow clinically #2. Acute on chronic renal failure, improving with IV fluid administration, follow tomorrow morning. #3. Right hand gout exacerbation, initiate patient on prednisone, follow clinically. It is unclear how much of infection/cellulitis patient has, we'll continue Unasyn for now #4. BPH with difficulty urinating, continue patient on Flomax, improved urinary output  Management plans discussed with the patient, family and they are in agreement.   DRUG ALLERGIES: No Known Allergies  CODE STATUS:     Code Status Orders        Start     Ordered   05/06/15 0029  Full code   Continuous     05/06/15 0028    Code Status History    Date Active Date Inactive Code Status Order ID Comments User Context   This patient has a current code status but no historical code status.      TOTAL TIME TAKING CARE OF THIS PATIENT: 35 minutes.    Theodoro Grist M.D on 05/06/2015 at 4:33 PM  Between 7am to 6pm - Pager - [phone]  After 6pm go to [email or web address] - password [email or web address] [facility]  Office  [phone]  CC: Primary care physician; [facility]

## 2015-05-06 NOTE — Evaluation (Signed)
Physical Therapy Evaluation Patient Details Name: Gary Grant MRN: [id number] DOB: February 22, 1924 Today's Date: 05/06/2015   History of Present Illness  82 yo M presented to ED on 3/5 and again 3/8 for abdominal/chest pain, difficulty urinating and R hand pain/swelling. He was found to have acute renal failure and R hand cellulitis. PMH includes HTN, CABG, SSS, COPD, hypothyroidism, CKD and prostate CA.  Clinical Impression  Pt demonstrated difficulty walking and poor safety awareness. He required min guard and frequent cues during transfers and ambulation for safety awareness. Ambulated 220 ftx2 with FWW with quick fatigue, pushes FWW out infront and occassionally steps outside of FWW during turns requiring cues for correction. He lives alone and presents some safety concerns and would benefit from intermittent supervision, medical alert system and HHPT to assess home safety. Pt will benefit from skilled PT services to increase functional I and mobility for safe discharge.     Follow Up Recommendations Home health PT;Supervision - Intermittent    Equipment Recommendations  Other (comment) (medical alert system as pt lives alone)    Recommendations for Other Services       Precautions / Restrictions Precautions Precautions: Fall Restrictions Weight Bearing Restrictions: No      Mobility  Bed Mobility Overal bed mobility: Modified Independent             General bed mobility comments: uses rail  Transfers Overall transfer level: Needs assistance Equipment used: Rolling walker (2 wheeled) Transfers: Sit to/from [facility] Sit to Stand: Min guard Stand pivot transfers: Min guard       General transfer comment: Pt had occassional posterior LOB upon standing and demonstrated poor safety awareness by pulling up on FWW and not placing hands in a safe position. He is occassionally unsteady during transfers and requires cues for safety and assistance to regain  balance.  Ambulation/Gait Ambulation/Gait assistance: Min guard Ambulation Distance (Feet): 220 Feet Assistive device: Rolling walker (2 wheeled) Gait Pattern/deviations: Decreased stride length     General Gait Details: Ambulated 220 ft x2 with FWW and min guard with seated rest break between. Demonstrated quick fatigue, pushes FWW out infront and occassionally steps outside of FWW during turns. He requires frequent cues for safety awareness. After ambulation SpO2 96% RA and HR 100.  Stairs            Wheelchair Mobility    Modified Rankin (Stroke Patients Only)       Balance Overall balance assessment: Needs assistance Sitting-balance support: Single extremity supported Sitting balance-Leahy Scale: Good     Standing balance support: Bilateral upper extremity supported Standing balance-Leahy Scale: Fair Standing balance comment: occassional posterior LOB upon standing                             Pertinent Vitals/Pain Pain Assessment: No/denies pain    Home Living Family/patient expects to be discharged to:: Private residence Living Arrangements: Alone Available Help at Discharge: Family Type of Home: House Home Access: Ramped entrance Entrance Stairs-Rails: Right   Home Layout: One level Home Equipment: Environmental consultant - 2 wheels;Cane - single point Additional Comments: Pt states he has 2 steps but has installed 2 planks to serve as a ramp. Unclear of the safety of this set up.    Prior Function Level of Independence: Independent with assistive device(s)         Comments: Pt states he uses FWW indoors and SPC ourdoors. Pt has no medical alert  system or cell phone incase of an emergency, and only has a house phone. I ADLs, daughter transports pt when needed     Hand Dominance        Extremity/Trunk Assessment   Upper Extremity Assessment: Overall WFL for tasks assessed           Lower Extremity Assessment: Overall WFL for tasks assessed          Communication   Communication: HOH  Cognition Arousal/Alertness: Awake/alert Behavior During Therapy: WFL for tasks assessed/performed Overall Cognitive Status: No family/caregiver present to determine baseline cognitive functioning (Pt couldn't recall what year it is.)                      General Comments General comments (skin integrity, edema, etc.): R hand edema    Exercises        Assessment/Plan    PT Assessment Patient needs continued PT services  PT Diagnosis Difficulty walking   PT Problem List Decreased activity tolerance;Decreased balance;Decreased mobility;Decreased knowledge of use of DME;Decreased safety awareness  PT Treatment Interventions Gait training;Therapeutic activities;Therapeutic exercise;Balance training;Patient/family education   PT Goals (Current goals can be found in the Care Plan section) Acute Rehab PT Goals Patient Stated Goal: To go home. PT Goal Formulation: With patient Time For Goal Achievement: 05/20/15 Potential to Achieve Goals: Fair    Frequency Min 2X/week   Barriers to discharge Inaccessible home environment;Decreased caregiver support Unsure of safety of ramped entrance. Pt will need intermittent care for safety.    Co-evaluation               End of Session Equipment Utilized During Treatment: Gait belt Activity Tolerance: Patient limited by fatigue Patient left: in chair;with call bell/phone within reach;with chair alarm set           Time: [phone] PT Time Calculation (min) (ACUTE ONLY): 30 min   Charges:   PT Evaluation $PT Eval Low Complexity: 1 Procedure PT Treatments $Gait Training: 8-22 mins   PT G Codes:        Neoma Laming, PT, DPT  05/06/2015, 9:55 AM [phone]

## 2015-05-07 DIAGNOSIS — R1013 Epigastric pain: Secondary | ICD-10-CM

## 2015-05-07 DIAGNOSIS — L03119 Cellulitis of unspecified part of limb: Secondary | ICD-10-CM

## 2015-05-07 DIAGNOSIS — N200 Calculus of kidney: Secondary | ICD-10-CM

## 2015-05-07 DIAGNOSIS — M109 Gout, unspecified: Secondary | ICD-10-CM

## 2015-05-07 DIAGNOSIS — K5900 Constipation, unspecified: Secondary | ICD-10-CM

## 2015-05-07 LAB — HEMOGLOBIN: Hemoglobin: 9.9 g/dL — ABNORMAL LOW (ref 13.0–18.0)

## 2015-05-07 LAB — BASIC METABOLIC PANEL
ANION GAP: 7 (ref 5–15)
BUN: 44 mg/dL — AB (ref 6–20)
CALCIUM: 8.3 mg/dL — AB (ref 8.9–10.3)
CO2: 23 mmol/L (ref 22–32)
Chloride: 107 mmol/L (ref 101–111)
Creatinine, Ser: 2.01 mg/dL — ABNORMAL HIGH (ref 0.61–1.24)
GFR calc Af Amer: 32 mL/min — ABNORMAL LOW (ref >60)
GFR calc non Af Amer: 28 mL/min — ABNORMAL LOW (ref >60)
GLUCOSE: 125 mg/dL — AB (ref 65–99)
POTASSIUM: 4.2 mmol/L (ref 3.5–5.1)
Sodium: 137 mmol/L (ref 135–145)

## 2015-05-07 MED ORDER — SODIUM CHLORIDE 0.9 % IV SOLN
3.0000 g | Freq: Three times a day (TID) | INTRAVENOUS | Status: DC
  Administered 2015-05-07: 11:00:00 3 g via INTRAVENOUS
  Filled 2015-05-07 (×2): qty 3

## 2015-05-07 MED ORDER — CEPHALEXIN 500 MG PO CAPS: 500.0000 mg | ORAL_CAPSULE | Freq: Two times a day (BID) | ORAL | Status: DC

## 2015-05-07 MED ORDER — PREDNISONE 10 MG (21) PO TBPK: 10.0000 mg | ORAL_TABLET | Freq: Every day | ORAL | Status: DC

## 2015-05-07 MED ORDER — DOCUSATE SODIUM 100 MG PO CAPS: 100.0000 mg | ORAL_CAPSULE | Freq: Two times a day (BID) | ORAL | Status: DC

## 2015-05-07 MED ORDER — PANTOPRAZOLE SODIUM 40 MG PO TBEC
40.0000 mg | DELAYED_RELEASE_TABLET | Freq: Two times a day (BID) | ORAL | Status: DC
Start: 2015-05-07 — End: 2015-05-07

## 2015-05-07 MED ORDER — SENNA 8.6 MG PO TABS: 1.0000 | ORAL_TABLET | Freq: Every day | ORAL | Status: DC | PRN

## 2015-05-07 MED ORDER — BISACODYL 10 MG RE SUPP
10.0000 mg | Freq: Once | RECTAL | Status: AC
  Administered 2015-05-07: 11:00:00 10 mg via RECTAL
  Filled 2015-05-07: qty 1

## 2015-05-07 MED ORDER — PANTOPRAZOLE SODIUM 40 MG PO TBEC
40.0000 mg | DELAYED_RELEASE_TABLET | Freq: Two times a day (BID) | ORAL | Status: DC
  Administered 2015-05-07: 40 mg via ORAL
  Filled 2015-05-07: qty 1

## 2015-05-07 MED ORDER — PANTOPRAZOLE SODIUM 40 MG PO TBEC: 40.0000 mg | DELAYED_RELEASE_TABLET | Freq: Two times a day (BID) | ORAL | Status: DC

## 2015-05-07 NOTE — Progress Notes (Signed)
Pharmacy Antibiotic Note  Gary Grant is a 82 y.o. male admitted on 05/05/2015 with cellulitis.  Pharmacy has been consulted for vancomycin and Unasyn dosing.  Vancomycin was discontinued on 05/07/15.  Plan: CrCl has improved today at 42ml/min.  Will change Unasyn to 3 grams q 8 hours.  Height: 5\' 11"  (180.3 cm) Weight: 263 lb 9.6 oz (119.568 kg) IBW/kg (Calculated) : 75.3  Temp (24hrs), Avg:98 F (36.7 C), Min:97.9 F (36.6 C), Max:98.2 F (36.8 C)   Recent Labs Lab 05/02/15 0927 05/05/15 1414 05/05/15 2017 05/06/15 0752 05/07/15 0539  WBC 7.9 8.5  --   --   --   CREATININE 2.07* 2.71*  --  2.43* 2.01*  LATICACIDVEN  --  1.0 1.3  --   --     Estimated Creatinine Clearance: 32.8 mL/min (by C-G formula based on Cr of 2.01).    No Known Allergies  Antimicrobials this admission:   Vancomycin 05/05/15>>05/06/15    Unasyn   05/05/15>>present   Dose adjustments this admission: Unasyn   Microbiology results: 3/8 BCx: pending  3/8 UA: (-)  Thank you for allowing pharmacy to be a part of this patient's care.  Nancy Fetter, PharmD Pharmacy Resident  05/07/2015 8:20 AM

## 2015-05-07 NOTE — Care Management (Signed)
Admitted to [facility] with the new onset diabetes. Lives alone. Daughter is [name] [phone]). Last was at [facility] 2 weeks ago. Home Health 3-4 years ago. Doesn't remember name of agency. No skilled nursing. No home oxygen. Uses a rolling walker and cane to aid in ambulation. Takes care of all basic activities of daily living himself, doesn't drive. Daughter helps with errands. No Life Alert. Last fall wa 2 years ago.  Physical therapy evaluation completed. Recommends home with home health. Discussed Home Health agencies with Gary Grant. [facility]. Floydene Flock, [facility] representative updated. Discharge to home today per Dr. Ether Griffins. Daughter will transport, Shelbie Ammons RN MSN [facility] [phone]

## 2015-05-07 NOTE — Progress Notes (Signed)
Pt is being discharge home. Discharge instructions given and explained to pt and family. Pt and family verbalized understanding. Prescriptions given. Follow up appointments and medications reviewed with pt.

## 2015-05-07 NOTE — Progress Notes (Signed)
CONCERNING: IV to Oral Route Change Policy  RECOMMENDATION: This patient is receiving pantoprazole by the intravenous route.  Based on criteria approved by the Pharmacy and Therapeutics Committee, the intravenous medication(s) is/are being converted to the equivalent oral dose form(s).   DESCRIPTION: These criteria include:  The patient is eating (either orally or via tube) and/or has been taking other orally administered medications for a least 24 hours  The patient has no evidence of active gastrointestinal bleeding or impaired GI absorption (gastrectomy, short bowel, patient on TNA or NPO).  If you have questions about this conversion, please contact the Pharmacy Department  []   [phone] )  [facility] [x]   [phone] )  [facility] []   [phone] )  [facility] []   [phone] )  [facility] []   [phone] )  [place], [facility] 05/07/2015 8:30 AM

## 2015-05-07 NOTE — Discharge Summary (Signed)
[facility] [place] [facility] [place] NAME: Gary Grant    MR#:  [id number]  DATE OF BIRTH:  1923-12-31  DATE OF ADMISSION:  05/05/2015 ADMITTING PHYSICIAN: Gladstone Lighter, MD  DATE OF DISCHARGE: 05/07/2015  1:10 PM  PRIMARY CARE PHYSICIAN: [place]     ADMISSION DIAGNOSIS:  Epigastric abdominal pain [R10.13] Cellulitis of finger of right hand [L03.011]  DISCHARGE DIAGNOSIS:  Active Problems:   Acute on chronic renal failure (HCC)   Abdominal pain, epigastric   Gout of hand   Cellulitis of hand   Constipation   Nephrolithiasis   SECONDARY DIAGNOSIS:   Past Medical History  Diagnosis Date  . Prostate cancer ([place])   . SSS (sick sinus syndrome) ([place])   . Chronic a-fib ([place])   . GERD (gastroesophageal reflux disease)   . CAD (coronary artery disease)   . PAD (peripheral artery disease) ([place])   . Venous insufficiency   . HTN (hypertension)   . COPD (chronic obstructive pulmonary disease) ([place])   . Hypothyroidism   . CKD (chronic kidney disease)     .pro HOSPITAL COURSE:   The patient is a 82 year old male with past medical history significant history of multiple medical problems including essential hypertension, coronary artery disease, coronary bypass grafting, hypothyroidism, sick sinus syndrome, COPD who presents to the hospital with complaints of 4 day history abdominal pain. Pain was described as burning sensation in the epigastric area, patient was also complaining of difficulty urinating. On arrival to emergency room, he was also noted to have a right hand cellulitis. He was initiated on vancomycin, right hand redness improved, although patient still complained of swelling in the joint area and pain on manipulation. Abdominal pain has subsided with conservative therapy. Recent CT scan was unremarkable except nephrolithiasis. Renal ultrasound was unremarkable, an abdominal ultrasound of right upper quadrant  was unremarkable. Uric acid level was found to be elevated above 10 and patient was initiated on prednisone for right hand gout exacerbation. Patient's kidney function was found to be abnormal on arrival to emergency room, impaired more so than usual with creatinine level of 2.71, patient was given IV fluids and his creatinine improved to baseline of 2.01 on the day of discharge. It was felt to be dehydration related and that was discussed with patient's daughter, advised to limit Lasix dose, following weight closely. Patient's condition improved in all regards. By the day of discharge and he was felt to be stable to be discharged home. He was seen by physical therapist and home health services were recommended upon discharge Discussion by problem #1. Epigastric abdominal pain of unclear etiology, suspected gastritis, continue  PPI, Carafate, follow clinically #2. Acute on chronic renal failure, improved with IV fluid administration, follow up with nephrologist as outpatient, discussed this patient's daughter today for about 10 minutes. #3. Right hand gout exacerbation as well as likely cellulitis, continue prednisone taper and Keflex, better clinically. Patient may benefit from evaluation by rheumatologist, follow-up will be arranged #4. BPH with difficulty urinating, continue patient on Flomax, improved urinary output, postvoid residual, needs to be followed closely  DISCHARGE CONDITIONS:   Stable  CONSULTS OBTAINED:     DRUG ALLERGIES:  No Known Allergies  DISCHARGE MEDICATIONS:   Discharge Medication List as of 05/07/2015 11:07 AM    START taking these medications   Details  predniSONE (STERAPRED UNI-PAK 21 TAB) 10 MG (21) TBPK tablet Take 1 tablet (10 mg total) by mouth daily. Please take  6 pills in the morning daily for 4 days, then taper by 1 pill/10 mg daily until finished, thank you, Starting 05/07/2015, Until Discontinued, Normal    cephALEXin (KEFLEX) 500 MG capsule Take 1 capsule  (500 mg total) by mouth 2 (two) times daily., Starting 05/07/2015, Until Discontinued, Normal    docusate sodium (COLACE) 100 MG capsule Take 1 capsule (100 mg total) by mouth 2 (two) times daily., Starting 05/07/2015, Until Discontinued, Normal    pantoprazole (PROTONIX) 40 MG tablet Take 1 tablet (40 mg total) by mouth 2 (two) times daily before a meal., Starting 05/07/2015, Until Discontinued, Normal    senna (SENOKOT) 8.6 MG TABS tablet Take 1 tablet (8.6 mg total) by mouth daily as needed for mild constipation., Starting 05/07/2015, Until Discontinued, Normal      CONTINUE these medications which have NOT CHANGED   Details  aspirin EC 81 MG tablet Take 81 mg by mouth daily., Until Discontinued, Historical Med    atorvastatin (LIPITOR) 20 MG tablet Take 20 mg by mouth at bedtime. , Until Discontinued, Historical Med    cholecalciferol (VITAMIN D) 1000 units tablet Take 1,000 Units by mouth daily., Until Discontinued, Historical Med    enalapril (VASOTEC) 5 MG tablet Take 5 mg by mouth daily., Until Discontinued, Historical Med    furosemide (LASIX) 40 MG tablet Take 40 mg by mouth 2 (two) times daily as needed for edema. , Until Discontinued, Historical Med    levothyroxine (SYNTHROID, LEVOTHROID) 25 MCG tablet Take 25 mcg by mouth daily before breakfast., Until Discontinued, Historical Med    metoprolol succinate (TOPROL-XL) 25 MG 24 hr tablet Take 12.5 mg by mouth at bedtime. , Until Discontinued, Historical Med    sucralfate (CARAFATE) 1 g tablet Take 1 tablet (1 g total) by mouth 4 (four) times daily., Starting 05/02/2015, Until Discontinued, Print    tamsulosin (FLOMAX) 0.4 MG CAPS capsule Take 0.4 mg by mouth daily., Until Discontinued, Historical Med         DISCHARGE INSTRUCTIONS:    Patient is to follow-up with primary care physician, nephrologist, rheumatologist as outpatient. The patient would benefit from gastroenterologist evaluation if has recurrent epigastric  discomfort despite therapy  If you experience worsening of your admission symptoms, develop shortness of breath, life threatening emergency, suicidal or homicidal thoughts you must seek medical attention immediately by calling 911 or calling your MD immediately  if symptoms less severe.  You Must read complete instructions/literature along with all the possible adverse reactions/side effects for all the Medicines you take and that have been prescribed to you. Take any new Medicines after you have completely understood and accept all the possible adverse reactions/side effects.   Please note  You were cared for by a hospitalist during your hospital stay. If you have any questions about your discharge medications or the care you received while you were in the hospital after you are discharged, you can call the unit and asked to speak with the hospitalist on call if the hospitalist that took care of you is not available. Once you are discharged, your primary care physician will handle any further medical issues. Please note that NO REFILLS for any discharge medications will be authorized once you are discharged, as it is imperative that you return to your primary care physician (or establish a relationship with a primary care physician if you do not have one) for your aftercare needs so that they can reassess your need for medications and monitor your lab values.  Today   CHIEF COMPLAINT:   Chief Complaint  Patient presents with  . Chest Pain  . Abdominal Pain    HISTORY OF PRESENT ILLNESS:  Mancil Noia  is a 82 y.o. male with a known history of multiple medical problems including essential hypertension, coronary artery disease, coronary bypass grafting, hypothyroidism, sick sinus syndrome, COPD who presents to the hospital with complaints of 4 day history abdominal pain. Pain was described as burning sensation in the epigastric area, patient was also complaining of difficulty urinating. On  arrival to emergency room, he was also noted to have a right hand cellulitis. He was initiated on vancomycin, right hand redness improved, although patient still complained of swelling in the joint area and pain on manipulation. Abdominal pain has subsided with conservative therapy. Recent CT scan was unremarkable except nephrolithiasis. Renal ultrasound was unremarkable, an abdominal ultrasound of right upper quadrant was unremarkable. Uric acid level was found to be elevated above 10 and patient was initiated on prednisone for right hand gout exacerbation. Patient's kidney function was found to be abnormal on arrival to emergency room, impaired more so than usual with creatinine level of 2.71, patient was given IV fluids and his creatinine improved to baseline of 2.01 on the day of discharge. It was felt to be dehydration related and that was discussed with patient's daughter, advised to limit Lasix dose, following weight closely. Patient's condition improved in all regards. By the day of discharge and he was felt to be stable to be discharged home. He was seen by physical therapist and home health services were recommended upon discharge Discussion by problem #1. Epigastric abdominal pain of unclear etiology, suspected gastritis, continue  PPI, Carafate, follow clinically #2. Acute on chronic renal failure, improved with IV fluid administration, follow up with nephrologist as outpatient, discussed this patient's daughter today for about 10 minutes. #3. Right hand gout exacerbation as well as likely cellulitis, continue prednisone taper and Keflex, better clinically. Patient may benefit from evaluation by rheumatologist, follow-up will be arranged #4. BPH with difficulty urinating, continue patient on Flomax, improved urinary output, postvoid residual, needs to be followed closely    VITAL SIGNS:  Blood pressure 133/71, pulse 95, temperature 97.9 F (36.6 C), temperature source Oral, resp. rate 18,  height 5\' 11"  (1.803 m), weight 119.568 kg (263 lb 9.6 oz), SpO2 95 %.  I/O:   Intake/Output Summary (Last 24 hours) at 05/07/15 1713 Last data filed at 05/07/15 0830  Gross per 24 hour  Intake   1449 ml  Output    700 ml  Net    749 ml    PHYSICAL EXAMINATION:  GENERAL:  82 y.o.-year-old patient lying in the bed with no acute distress.  EYES: Pupils equal, round, reactive to light and accommodation. No scleral icterus. Extraocular muscles intact.  HEENT: Head atraumatic, normocephalic. Oropharynx and nasopharynx clear.  NECK:  Supple, no jugular venous distention. No thyroid enlargement, no tenderness.  LUNGS: Normal breath sounds bilaterally, no wheezing, rales,rhonchi or crepitation. No use of accessory muscles of respiration.  CARDIOVASCULAR: S1, S2 normal. No murmurs, rubs, or gallops.  ABDOMEN: Soft, non-tender, non-distended. Bowel sounds present. No organomegaly or mass.  EXTREMITIES: No pedal edema, cyanosis, or clubbing. Right hand dorsum mild erythema and swelling of metacarpophalangeal and proximal phalangeal phalangeal joints NEUROLOGIC: Cranial nerves II through XII are intact. Muscle strength 5/5 in all extremities. Sensation intact. Gait not checked.  PSYCHIATRIC: The patient is alert and oriented x 3.  SKIN: No obvious rash,  lesion, or ulcer.   DATA REVIEW:   CBC  Recent Labs Lab 05/05/15 1414 05/07/15 0539  WBC 8.5  --   HGB 9.9* 9.9*  HCT 30.7*  --   PLT 182  --     Chemistries   Recent Labs Lab 05/05/15 1414  05/07/15 0539  NA 138  < > 137  K 3.9  < > 4.2  CL 103  < > 107  CO2 25  < > 23  GLUCOSE 103*  < > 125*  BUN 56*  < > 44*  CREATININE 2.71*  < > 2.01*  CALCIUM 8.4*  < > 8.3*  AST 17  --   --   ALT 13*  --   --   ALKPHOS 61  --   --   BILITOT 0.5  --   --   < > = values in this interval not displayed.  Cardiac Enzymes  Recent Labs Lab 05/05/15 1414  TROPONINI <0.03    Microbiology Results  Results for orders placed or  performed during the hospital encounter of 05/05/15  Blood culture (routine x 2)     Status: None (Preliminary result)   Collection Time: 05/05/15  8:15 PM  Result Value Ref Range Status   Specimen Description BLOOD LEFT  Final   Special Requests BOTTLES DRAWN AEROBIC AND ANAEROBIC 7ML  Final   Culture NO GROWTH 2 DAYS  Final   Report Status PENDING  Incomplete  Blood culture (routine x 2)     Status: None (Preliminary result)   Collection Time: 05/05/15  8:20 PM  Result Value Ref Range Status   Specimen Description BLOOD LEFT HAND  Final   Special Requests BOTTLES DRAWN AEROBIC AND ANAEROBIC 8ML  Final   Culture NO GROWTH 2 DAYS  Final   Report Status PENDING  Incomplete    RADIOLOGY:  US Renal  05/06/2015  CLINICAL DATA:  Acute renal failure EXAM: RENAL / URINARY TRACT ULTRASOUND COMPLETE COMPARISON:  Multiple prior studies FINDINGS: Right Kidney: Length: 12 cm. Lateral interpolar cyst measures 14 x 12 x 14 mm. This is stable from 03/16/2010. Echogenicity is normal. There is no hydronephrosis. Left Kidney: Length: 13.2 cm. Echogenicity is normal and there is no hydronephrosis. There is an 11 mm upper pole stone and a 7 mm mid pole stone. Bladder: Normal for degree of distention IMPRESSION: No acute abnormalities. Normal renal echogenicity with no hydronephrosis. Nonobstructing left calculi. Electronically Signed   By: Skipper Cliche M.D.   On: 05/06/2015 13:38   Dg Abd Acute W/chest  05/05/2015  CLINICAL DATA:  Epigastric pain, chest pain for about 2 weeks EXAM: DG ABDOMEN ACUTE W/ 1V CHEST COMPARISON:  05/05/2015 and CT scan 05/02/2015 FINDINGS: Cardiomediastinal silhouette is stable. No acute infiltrate or pleural effusion. No pulmonary edema. Status post median sternotomy again noted. Stable left basilar atelectasis or scarring. There is normal small bowel gas pattern. Contrast material from recent CT scan noted throughout the colon. No evidence of free abdominal air. Pelvic phleboliths are  noted. IMPRESSION: Negative abdominal radiographs. No acute cardiopulmonary disease. Contrast material from recent CT scan noted throughout the colon. Electronically Signed   By: Lahoma Crocker M.D.   On: 05/05/2015 19:13   Dg Hand Complete Right  05/05/2015  CLINICAL DATA:  Right hand swelling and redness since 05/02/2015. No known injury. Initial encounter. EXAM: RIGHT HAND - COMPLETE 3+ VIEW COMPARISON:  None. FINDINGS: No acute bony or joint abnormality is identified. Osteoarthritis about the fingers  appears worst at the DIP joint of the index finger. Marked joint space narrowing is also seen scaphoid trapezium trapezoid joint. No radiopaque foreign body or soft tissue gas collection is identified. No abnormal soft tissue calcification is seen. IMPRESSION: No acute abnormality. Multifocal osteoarthritis. Electronically Signed   By: Inge Rise M.D.   On: 05/05/2015 19:12   US Abdomen Limited Ruq  05/05/2015  CLINICAL DATA:  Epigastric pain for 1 week EXAM: US ABDOMEN LIMITED - RIGHT UPPER QUADRANT COMPARISON:  CT scan 05/02/2015 FINDINGS: Gallbladder: No gallstones or wall thickening visualized. No sonographic Murphy sign noted by sonographer. Common bile duct: Diameter: 3.3 mm in diameter within normal limits. Liver: No focal lesion identified. Mild increased echogenicity of the liver suspicious for fatty infiltration. IMPRESSION: No gallstones are noted within gallbladder. Normal CBD. Mild increased echogenicity of the liver suspicious for fatty infiltration. Electronically Signed   By: Lahoma Crocker M.D.   On: 05/05/2015 19:32    EKG:   Orders placed or performed during the hospital encounter of 05/05/15  . ED EKG  . ED EKG      Management plans discussed with the patient, family and they are in agreement.  CODE STATUS:  Code Status History    Date Active Date Inactive Code Status Order ID Comments User Context   05/06/2015 12:28 AM 05/07/2015  4:50 PM Full Code [id number]  Gladstone Lighter,  MD Inpatient      TOTAL TIME TAKING CARE OF THIS PATIENT: 40 minutes.    Theodoro Grist M.D on 05/07/2015 at 5:13 PM  Between 7am to 6pm - Pager - [phone]  After 6pm go to [email or web address] - password [email or web address] [facility]  Office  [phone]  CC: Primary care physician; [facility]

## 2015-05-07 NOTE — Care Management Important Message (Signed)
Important Message  Patient Details  Name: Gary Grant MRN: [id number] Date of Birth: 06-29-1923   Medicare Important Message Given:  Yes    Juliann Pulse A Susana Duell 05/07/2015, 9:53 AM

## 2015-05-10 LAB — CULTURE, BLOOD (ROUTINE X 2)
Culture: NO GROWTH
Culture: NO GROWTH

## 2015-11-26 ENCOUNTER — Encounter: Payer: Self-pay | Admitting: Emergency Medicine

## 2015-11-26 ENCOUNTER — Emergency Department: Payer: Medicare HMO

## 2015-11-26 ENCOUNTER — Observation Stay
Admission: EM | Admit: 2015-11-26 | Discharge: 2015-11-29 | Disposition: A | Discharge status: Skilled Nursing Facility | Payer: Medicare HMO | Attending: Internal Medicine | Admitting: Internal Medicine

## 2015-11-26 DIAGNOSIS — I739 Peripheral vascular disease, unspecified: Secondary | ICD-10-CM | POA: Insufficient documentation

## 2015-11-26 DIAGNOSIS — J449 Chronic obstructive pulmonary disease, unspecified: Secondary | ICD-10-CM | POA: Diagnosis not present

## 2015-11-26 DIAGNOSIS — R200 Anesthesia of skin: Secondary | ICD-10-CM | POA: Diagnosis not present

## 2015-11-26 DIAGNOSIS — I872 Venous insufficiency (chronic) (peripheral): Secondary | ICD-10-CM | POA: Diagnosis not present

## 2015-11-26 DIAGNOSIS — Z8546 Personal history of malignant neoplasm of prostate: Secondary | ICD-10-CM | POA: Insufficient documentation

## 2015-11-26 DIAGNOSIS — N179 Acute kidney failure, unspecified: Secondary | ICD-10-CM | POA: Diagnosis not present

## 2015-11-26 DIAGNOSIS — M546 Pain in thoracic spine: Secondary | ICD-10-CM | POA: Diagnosis not present

## 2015-11-26 DIAGNOSIS — R531 Weakness: Secondary | ICD-10-CM | POA: Diagnosis present

## 2015-11-26 DIAGNOSIS — M5136 Other intervertebral disc degeneration, lumbar region: Secondary | ICD-10-CM | POA: Diagnosis not present

## 2015-11-26 DIAGNOSIS — I482 Chronic atrial fibrillation: Secondary | ICD-10-CM | POA: Diagnosis not present

## 2015-11-26 DIAGNOSIS — E039 Hypothyroidism, unspecified: Secondary | ICD-10-CM | POA: Insufficient documentation

## 2015-11-26 DIAGNOSIS — Z87891 Personal history of nicotine dependence: Secondary | ICD-10-CM | POA: Diagnosis not present

## 2015-11-26 DIAGNOSIS — W19XXXA Unspecified fall, initial encounter: Secondary | ICD-10-CM

## 2015-11-26 DIAGNOSIS — I129 Hypertensive chronic kidney disease with stage 1 through stage 4 chronic kidney disease, or unspecified chronic kidney disease: Secondary | ICD-10-CM | POA: Insufficient documentation

## 2015-11-26 DIAGNOSIS — R001 Bradycardia, unspecified: Secondary | ICD-10-CM | POA: Insufficient documentation

## 2015-11-26 DIAGNOSIS — E785 Hyperlipidemia, unspecified: Secondary | ICD-10-CM | POA: Insufficient documentation

## 2015-11-26 DIAGNOSIS — R739 Hyperglycemia, unspecified: Secondary | ICD-10-CM | POA: Insufficient documentation

## 2015-11-26 DIAGNOSIS — T50905A Adverse effect of unspecified drugs, medicaments and biological substances, initial encounter: Secondary | ICD-10-CM

## 2015-11-26 DIAGNOSIS — Z7982 Long term (current) use of aspirin: Secondary | ICD-10-CM | POA: Insufficient documentation

## 2015-11-26 DIAGNOSIS — R29898 Other symptoms and signs involving the musculoskeletal system: Secondary | ICD-10-CM

## 2015-11-26 DIAGNOSIS — K219 Gastro-esophageal reflux disease without esophagitis: Secondary | ICD-10-CM | POA: Diagnosis not present

## 2015-11-26 DIAGNOSIS — I2510 Atherosclerotic heart disease of native coronary artery without angina pectoris: Secondary | ICD-10-CM | POA: Diagnosis not present

## 2015-11-26 DIAGNOSIS — D649 Anemia, unspecified: Secondary | ICD-10-CM | POA: Insufficient documentation

## 2015-11-26 DIAGNOSIS — N189 Chronic kidney disease, unspecified: Secondary | ICD-10-CM | POA: Diagnosis not present

## 2015-11-26 DIAGNOSIS — E860 Dehydration: Secondary | ICD-10-CM | POA: Insufficient documentation

## 2015-11-26 DIAGNOSIS — T447X5A Adverse effect of beta-adrenoreceptor antagonists, initial encounter: Secondary | ICD-10-CM | POA: Diagnosis not present

## 2015-11-26 DIAGNOSIS — M549 Dorsalgia, unspecified: Secondary | ICD-10-CM

## 2015-11-26 DIAGNOSIS — Z9181 History of falling: Secondary | ICD-10-CM | POA: Diagnosis not present

## 2015-11-26 DIAGNOSIS — Z79899 Other long term (current) drug therapy: Secondary | ICD-10-CM | POA: Diagnosis not present

## 2015-11-26 LAB — COMPREHENSIVE METABOLIC PANEL
ALBUMIN: 3.9 g/dL (ref 3.5–5.0)
ALK PHOS: 61 U/L (ref 38–126)
ALT: 16 U/L — AB (ref 17–63)
AST: 21 U/L (ref 15–41)
Anion gap: 7 (ref 5–15)
BUN: 41 mg/dL — AB (ref 6–20)
CALCIUM: 8.9 mg/dL (ref 8.9–10.3)
CO2: 25 mmol/L (ref 22–32)
CREATININE: 2.17 mg/dL — AB (ref 0.61–1.24)
Chloride: 106 mmol/L (ref 101–111)
GFR calc non Af Amer: 25 mL/min — ABNORMAL LOW (ref >60)
GFR, EST AFRICAN AMERICAN: 29 mL/min — AB (ref >60)
GLUCOSE: 102 mg/dL — AB (ref 65–99)
Potassium: 4 mmol/L (ref 3.5–5.1)
SODIUM: 138 mmol/L (ref 135–145)
Total Bilirubin: 0.6 mg/dL (ref 0.3–1.2)
Total Protein: 7 g/dL (ref 6.5–8.1)

## 2015-11-26 LAB — CBC
HCT: 32.5 % — ABNORMAL LOW (ref 40.0–52.0)
Hemoglobin: 10.9 g/dL — ABNORMAL LOW (ref 13.0–18.0)
MCH: 30.7 pg (ref 26.0–34.0)
MCHC: 33.6 g/dL (ref 32.0–36.0)
MCV: 91.3 fL (ref 80.0–100.0)
PLATELETS: 189 10*3/uL (ref 150–440)
RBC: 3.56 MIL/uL — ABNORMAL LOW (ref 4.40–5.90)
RDW: 17.9 % — AB (ref 11.5–14.5)
WBC: 7.4 10*3/uL (ref 3.8–10.6)

## 2015-11-26 LAB — URINALYSIS COMPLETE WITH MICROSCOPIC (ARMC ONLY)
BACTERIA UA: NONE SEEN
Bilirubin Urine: NEGATIVE
Glucose, UA: NEGATIVE mg/dL
Hgb urine dipstick: NEGATIVE
Ketones, ur: NEGATIVE mg/dL
Leukocytes, UA: NEGATIVE
Nitrite: NEGATIVE
PH: 5 (ref 5.0–8.0)
PROTEIN: NEGATIVE mg/dL
SQUAMOUS EPITHELIAL / LPF: NONE SEEN
Specific Gravity, Urine: 1.012 (ref 1.005–1.030)

## 2015-11-26 LAB — T4, FREE: Free T4: 0.86 ng/dL (ref 0.61–1.12)

## 2015-11-26 LAB — TROPONIN I: Troponin I: <0.03 ng/mL (ref <0.03)

## 2015-11-26 LAB — TSH: TSH: 17.974 u[IU]/mL — AB (ref 0.350–4.500)

## 2015-11-26 MED ORDER — HEPARIN SODIUM (PORCINE) 5000 UNIT/ML IJ SOLN
5000.0000 [IU] | Freq: Three times a day (TID) | INTRAMUSCULAR | Status: DC
  Administered 2015-11-26 – 2015-11-29 (×8): 5000 [IU] via SUBCUTANEOUS
  Filled 2015-11-26 (×9): qty 1

## 2015-11-26 MED ORDER — VITAMIN D 1000 UNITS PO TABS
1000.0000 [IU] | ORAL_TABLET | Freq: Every day | ORAL | Status: DC
  Administered 2015-11-27 – 2015-11-29 (×3): 1000 [IU] via ORAL
  Filled 2015-11-26 (×3): qty 1

## 2015-11-26 MED ORDER — ENALAPRIL MALEATE 10 MG PO TABS
5.0000 mg | ORAL_TABLET | Freq: Every day | ORAL | Status: DC
  Administered 2015-11-27 – 2015-11-29 (×3): 5 mg via ORAL
  Filled 2015-11-26 (×3): qty 1

## 2015-11-26 MED ORDER — SENNA 8.6 MG PO TABS: 1.0000 | ORAL_TABLET | Freq: Every day | ORAL | Status: DC | PRN

## 2015-11-26 MED ORDER — METOPROLOL SUCCINATE ER 25 MG PO TB24: 12.5000 mg | ORAL_TABLET | Freq: Every day | ORAL | Status: DC

## 2015-11-26 MED ORDER — ASPIRIN EC 81 MG PO TBEC
81.0000 mg | DELAYED_RELEASE_TABLET | Freq: Every day | ORAL | Status: DC
  Administered 2015-11-27 – 2015-11-29 (×3): 81 mg via ORAL
  Filled 2015-11-26 (×3): qty 1

## 2015-11-26 MED ORDER — LEVOTHYROXINE SODIUM 50 MCG PO TABS
100.0000 ug | ORAL_TABLET | Freq: Every day | ORAL | Status: DC
  Administered 2015-11-27 – 2015-11-29 (×3): 100 ug via ORAL
  Filled 2015-11-26 (×3): qty 2

## 2015-11-26 MED ORDER — ONDANSETRON HCL 4 MG PO TABS: 4.0000 mg | ORAL_TABLET | Freq: Four times a day (QID) | ORAL | Status: DC | PRN

## 2015-11-26 MED ORDER — ACETAMINOPHEN 325 MG PO TABS
650.0000 mg | ORAL_TABLET | Freq: Four times a day (QID) | ORAL | Status: DC | PRN
  Administered 2015-11-27: 650 mg via ORAL
  Filled 2015-11-26: qty 2

## 2015-11-26 MED ORDER — ATORVASTATIN CALCIUM 20 MG PO TABS
20.0000 mg | ORAL_TABLET | Freq: Every day | ORAL | Status: DC
  Administered 2015-11-26 – 2015-11-28 (×3): 20 mg via ORAL
  Filled 2015-11-26 (×3): qty 1

## 2015-11-26 MED ORDER — TAMSULOSIN HCL 0.4 MG PO CAPS
0.4000 mg | ORAL_CAPSULE | Freq: Every day | ORAL | Status: DC
  Administered 2015-11-27 – 2015-11-29 (×3): 0.4 mg via ORAL
  Filled 2015-11-26 (×3): qty 1

## 2015-11-26 MED ORDER — DOCUSATE SODIUM 100 MG PO CAPS
100.0000 mg | ORAL_CAPSULE | Freq: Two times a day (BID) | ORAL | Status: DC
  Administered 2015-11-26 – 2015-11-29 (×6): 100 mg via ORAL
  Filled 2015-11-26 (×6): qty 1

## 2015-11-26 MED ORDER — ONDANSETRON HCL 4 MG/2ML IJ SOLN: 4.0000 mg | Freq: Four times a day (QID) | INTRAMUSCULAR | Status: DC | PRN

## 2015-11-26 MED ORDER — ACETAMINOPHEN 650 MG RE SUPP: 650.0000 mg | Freq: Four times a day (QID) | RECTAL | Status: DC | PRN

## 2015-11-26 MED ORDER — PANTOPRAZOLE SODIUM 40 MG PO TBEC
40.0000 mg | DELAYED_RELEASE_TABLET | Freq: Two times a day (BID) | ORAL | Status: DC
  Administered 2015-11-27 – 2015-11-29 (×5): 40 mg via ORAL
  Filled 2015-11-26 (×5): qty 1

## 2015-11-26 MED ORDER — FUROSEMIDE 40 MG PO TABS: 40.0000 mg | ORAL_TABLET | Freq: Two times a day (BID) | ORAL | Status: DC | PRN

## 2015-11-26 NOTE — ED Notes (Signed)
Pt denies needs at this time. Pt updated on admission process.

## 2015-11-26 NOTE — ED Notes (Signed)
Transporting patient to 2C-210

## 2015-11-26 NOTE — H&P (Signed)
[facility] NAME: Gary Grant    MR#:  [id number]  DATE OF BIRTH:  1923/12/31   DATE OF ADMISSION:  11/26/2015  PRIMARY CARE PHYSICIAN: [facility]   REQUESTING/REFERRING PHYSICIAN: [place]  CHIEF COMPLAINT:   Chief Complaint  Patient presents with  . Weakness    HISTORY OF PRESENT ILLNESS:  Gary Grant  is a 82 y.o. male with a known history of Hypothyroidism unspecified who is presenting with weakness. He states progressive weakness over the past week, lower extremity weakness he has suffered numerous falls. He is now using a cane for the last month. He also has issues with constipation lately. Came to the hospital given cluster of symptoms and 2 falls today. Unable to ambulate in emergency department  PAST MEDICAL HISTORY:   Past Medical History:  Diagnosis Date  . CAD (coronary artery disease)   . Chronic a-fib ([place])   . CKD (chronic kidney disease)   . COPD (chronic obstructive pulmonary disease) ([place])   . GERD (gastroesophageal reflux disease)   . HTN (hypertension)   . Hypothyroidism   . PAD (peripheral artery disease) ([place])   . Prostate cancer ([place])   . SSS (sick sinus syndrome) ([place])   . Venous insufficiency     PAST SURGICAL HISTORY:   Past Surgical History:  Procedure Laterality Date  . CARDIAC SURGERY     bypass  . EYE SURGERY    . FRACTURE SURGERY      SOCIAL HISTORY:   Social History  Substance Use Topics  . Smoking status: Former Research scientist (life sciences)  . Smokeless tobacco: Never Used  . Alcohol use No    FAMILY HISTORY:   Family History  Problem Relation Age of Onset  . Osteoporosis Mother   . Heart Problems Father   . Stroke Maternal Grandfather   . Stroke Paternal Grandfather   . Stroke Paternal Grandmother     DRUG ALLERGIES:  No Known Allergies  REVIEW OF SYSTEMS:  REVIEW OF SYSTEMS:  CONSTITUTIONAL: Denies fevers, chills, Positive fatigue, weakness.  EYES:  Denies blurred vision, double vision, or eye pain.  EARS, NOSE, THROAT: Denies tinnitus, ear pain, hearing loss.  RESPIRATORY: denies cough, shortness of breath, wheezing  CARDIOVASCULAR: Denies chest pain, palpitations, edema.  GASTROINTESTINAL: Denies nausea, vomiting, diarrhea, abdominal pain. Positive constipation GENITOURINARY: Denies dysuria, hematuria.  ENDOCRINE: Denies nocturia or thyroid problems. HEMATOLOGIC AND LYMPHATIC: Denies easy bruising or bleeding.  SKIN: Denies rash or lesions.  MUSCULOSKELETAL: Denies pain in neck, back, shoulder, knees, hips, or further arthritic symptoms.  NEUROLOGIC: Denies paralysis, paresthesias.  PSYCHIATRIC: Denies anxiety or depressive symptoms. Otherwise full review of systems performed by me is negative.   MEDICATIONS AT HOME:   Prior to Admission medications   Medication Sig Start Date End Date Taking? Authorizing Provider  aspirin EC 81 MG tablet Take 81 mg by mouth daily.    Historical Provider, MD  atorvastatin (LIPITOR) 20 MG tablet Take 20 mg by mouth at bedtime.     Historical Provider, MD  cephALEXin (KEFLEX) 500 MG capsule Take 1 capsule (500 mg total) by mouth 2 (two) times daily. 05/07/15   Theodoro Grist, MD  cholecalciferol (VITAMIN D) 1000 units tablet Take 1,000 Units by mouth daily.    Historical Provider, MD  docusate sodium (COLACE) 100 MG capsule Take 1 capsule (100 mg total) by mouth 2 (two) times daily. 05/07/15   Theodoro Grist, MD  enalapril (VASOTEC) 5 MG tablet  Take 5 mg by mouth daily.    Historical Provider, MD  furosemide (LASIX) 40 MG tablet Take 40 mg by mouth 2 (two) times daily as needed for edema.     Historical Provider, MD  levothyroxine (SYNTHROID, LEVOTHROID) 25 MCG tablet Take 25 mcg by mouth daily before breakfast.    Historical Provider, MD  metoprolol succinate (TOPROL-XL) 25 MG 24 hr tablet Take 12.5 mg by mouth at bedtime.     Historical Provider, MD  pantoprazole (PROTONIX) 40 MG tablet Take 1 tablet  (40 mg total) by mouth 2 (two) times daily before a meal. 05/07/15   Theodoro Grist, MD  predniSONE (STERAPRED UNI-PAK 21 TAB) 10 MG (21) TBPK tablet Take 1 tablet (10 mg total) by mouth daily. Please take 6 pills in the morning daily for 4 days, then taper by 1 pill/10 mg daily until finished, thank you 05/07/15   Theodoro Grist, MD  senna (SENOKOT) 8.6 MG TABS tablet Take 1 tablet (8.6 mg total) by mouth daily as needed for mild constipation. 05/07/15   Theodoro Grist, MD  sucralfate (CARAFATE) 1 g tablet Take 1 tablet (1 g total) by mouth 4 (four) times daily. 05/02/15   Nance Pear, MD  tamsulosin (FLOMAX) 0.4 MG CAPS capsule Take 0.4 mg by mouth daily.    Historical Provider, MD      VITAL SIGNS:  Blood pressure (!) 164/85, pulse 70, temperature 98.3 F (36.8 C), temperature source Oral, resp. rate 20, height 6' (1.829 m), weight 117.9 kg (260 lb), SpO2 98 %.  PHYSICAL EXAMINATION:  VITAL SIGNS: Vitals:   11/26/15 1900 11/26/15 1915  BP: (!) 164/85   Pulse: 99 70  Resp: 15 20  Temp:     GENERAL:82 y.o.male currently in no acute distress.  HEAD: Normocephalic, atraumatic.  EYES: Pupils equal, round, reactive to light. Extraocular muscles intact. No scleral icterus.  MOUTH: Moist mucosal membrane. Dentition intact. No abscess noted.  EAR, NOSE, THROAT: Clear without exudates. No external lesions.  NECK: Supple. No thyromegaly. No nodules. No JVD.  PULMONARY: Clear to ascultation, without wheeze rails or rhonci. No use of accessory muscles, Good respiratory effort. good air entry bilaterally CHEST: Nontender to palpation.  CARDIOVASCULAR: S1 and S2. Regular rate and rhythm. No murmurs, rubs, or gallops. No edema. Pedal pulses 2+ bilaterally.  GASTROINTESTINAL: Soft, nontender, nondistended. No masses. Positive bowel sounds. No hepatosplenomegaly.  MUSCULOSKELETAL: No swelling, clubbing, or edema. Range of motion full in all extremities.  NEUROLOGIC: Cranial nerves II through XII are  intact. No gross focal neurological deficits. Sensation intact. Reflexes intact. Upper extremity strength 5/5 lower extremity proximal 4/5 distal 5/5 SKIN: No ulceration, lesions, rashes, or cyanosis. Skin warm and dry. Turgor intact.  PSYCHIATRIC: Mood, affect within normal limits. The patient is awake, alert and oriented x 3. Insight, judgment intact.    LABORATORY PANEL:   CBC  Recent Labs Lab 11/26/15 1411  WBC 7.4  HGB 10.9*  HCT 32.5*  PLT 189   ------------------------------------------------------------------------------------------------------------------  Chemistries   Recent Labs Lab 11/26/15 1411  NA 138  K 4.0  CL 106  CO2 25  GLUCOSE 102*  BUN 41*  CREATININE 2.17*  CALCIUM 8.9  AST 21  ALT 16*  ALKPHOS 61  BILITOT 0.6   ------------------------------------------------------------------------------------------------------------------  Cardiac Enzymes  Recent Labs Lab 11/26/15 1411  TROPONINI <0.03   ------------------------------------------------------------------------------------------------------------------  RADIOLOGY:  Dg Chest 2 View  Result Date: 11/26/2015 CLINICAL DATA:  Weakness, cough and shortness of breath. EXAM: CHEST  2  VIEW COMPARISON:  Chest radiograph 05/05/2015 FINDINGS: Median sternotomy wires are again seen. Cardiomediastinal contours are unchanged. No pneumothorax or sizable pleural effusion. Mild opacities in the left lung base appear unchanged. No focal consolidation or pulmonary edema. IMPRESSION: No active cardiopulmonary disease. Electronically Signed   By: Ulyses Jarred M.D.   On: 11/26/2015 17:09    EKG:   Orders placed or performed during the hospital encounter of 11/26/15  . EKG 12-Lead  . EKG 12-Lead    IMPRESSION AND PLAN:   82 year old African-American gentleman history of hypothyroidism presenting with multiple falls and weakness  1. Generalized weakness/multiple falls: Multifactorial physical therapy  evaluation check vitamin D level II. Hypothyroidism unspecified: Markedly abnormal TSH, check free T4, this may be the contributing factor to the above He is on a stable dose of Synthroid for some time now, we will increase his dosage based on body weight should be on about 187 mcg, however given cardiac history we will advance slowly. 3. Essential hypertension enalapril 4. Hyperlipidemia unspecified: Lipitor    All the records are reviewed and case discussed with ED provider. Management plans discussed with the patient, family and they are in agreement.  CODE STATUS: Full  TOTAL TIME TAKING CARE OF THIS PATIENT: 33 minutes.    Latesha Chesney,  Karenann Cai.D on 11/26/2015 at 7:28 PM  Between 7am to 6pm - Pager - [phone]  After 6pm: House Pager: - [facility]  Office  [phone]  CC: Primary care physician; [facility]

## 2015-11-26 NOTE — ED Notes (Signed)
Patient transported to X-ray 

## 2015-11-26 NOTE — ED Triage Notes (Signed)
Generalized weakness in legs since wednesday.  Fell on Wednesday and today.  Denies injuries. Grips equal, no facial droop, MAE

## 2015-11-26 NOTE — ED Provider Notes (Addendum)
[facility] Emergency Department Provider Note   ____________________________________________   First MD Initiated Contact with Patient 11/26/15 1622     (approximate)  I have reviewed the triage vital signs and the nursing notes.   HISTORY  Chief Complaint Weakness   HPI Gary Grant is a 83 y.o. male with a history of CAD, A. fib and chronic kidney disease who is presenting to the emergency department today with bilateral lower extremity weakness. On further interviewing he says that he feels weak all over but has had 3 falls over the past 3 days. He says that he fell once this past Wednesday and then twice today.  Denies hitting his head or losing consciousness after falling. He says that he did brace his fall this past Wednesday with his left hand and his right elbow where he sustained several small skin tears. However, he retains full range of motion of the left hand as well as the right elbow. He is also complaining of a cough although he denies any runny nose or fever. Says that it is a dry cough. Also says that he has some burning with urination. Denies any diarrhea. Says the last week he did have some blistering to the left medial portion of his lower extremity. He lives with his daughter who is also his caretaker. He says that she was considering this to be a case of shingles. He also had a flu shot earlier today. Denies any numbness to his lower extremities. However, the patient denies any pain with the previous lesions to his left lower extremity.   Past Medical History:  Diagnosis Date  . CAD (coronary artery disease)   . Chronic a-fib ([place])   . CKD (chronic kidney disease)   . COPD (chronic obstructive pulmonary disease) ([place])   . GERD (gastroesophageal reflux disease)   . HTN (hypertension)   . Hypothyroidism   . PAD (peripheral artery disease) ([place])   . Prostate cancer ([place])   . SSS (sick sinus syndrome) ([place])   . Venous insufficiency      Patient Active Problem List   Diagnosis Date Noted  . Weakness 11/26/2015  . Abdominal pain, epigastric 05/07/2015  . Gout of hand 05/07/2015  . Nephrolithiasis 05/07/2015  . Cellulitis of hand 05/07/2015  . Constipation 05/07/2015  . Acute on chronic renal failure ([place]) 05/05/2015    Past Surgical History:  Procedure Laterality Date  . CARDIAC SURGERY     bypass  . EYE SURGERY    . FRACTURE SURGERY      Prior to Admission medications   Medication Sig Start Date End Date Taking? Authorizing Provider  aspirin EC 81 MG tablet Take 81 mg by mouth daily.    Historical Provider, MD  atorvastatin (LIPITOR) 20 MG tablet Take 20 mg by mouth at bedtime.     Historical Provider, MD  cephALEXin (KEFLEX) 500 MG capsule Take 1 capsule (500 mg total) by mouth 2 (two) times daily. 05/07/15   Theodoro Grist, MD  cholecalciferol (VITAMIN D) 1000 units tablet Take 1,000 Units by mouth daily.    Historical Provider, MD  docusate sodium (COLACE) 100 MG capsule Take 1 capsule (100 mg total) by mouth 2 (two) times daily. 05/07/15   Theodoro Grist, MD  enalapril (VASOTEC) 5 MG tablet Take 5 mg by mouth daily.    Historical Provider, MD  furosemide (LASIX) 40 MG tablet Take 40 mg by mouth 2 (two) times daily as needed for edema.  Historical Provider, MD  levothyroxine (SYNTHROID, LEVOTHROID) 25 MCG tablet Take 25 mcg by mouth daily before breakfast.    Historical Provider, MD  metoprolol succinate (TOPROL-XL) 25 MG 24 hr tablet Take 12.5 mg by mouth at bedtime.     Historical Provider, MD  pantoprazole (PROTONIX) 40 MG tablet Take 1 tablet (40 mg total) by mouth 2 (two) times daily before a meal. 05/07/15   Theodoro Grist, MD  predniSONE (STERAPRED UNI-PAK 21 TAB) 10 MG (21) TBPK tablet Take 1 tablet (10 mg total) by mouth daily. Please take 6 pills in the morning daily for 4 days, then taper by 1 pill/10 mg daily until finished, thank you 05/07/15   Theodoro Grist, MD  senna (SENOKOT) 8.6 MG TABS tablet  Take 1 tablet (8.6 mg total) by mouth daily as needed for mild constipation. 05/07/15   Theodoro Grist, MD  sucralfate (CARAFATE) 1 g tablet Take 1 tablet (1 g total) by mouth 4 (four) times daily. 05/02/15   Nance Pear, MD  tamsulosin (FLOMAX) 0.4 MG CAPS capsule Take 0.4 mg by mouth daily.    Historical Provider, MD    Allergies Review of patient's allergies indicates no known allergies.  Family History  Problem Relation Age of Onset  . Osteoporosis Mother   . Heart Problems Father   . Stroke Maternal Grandfather   . Stroke Paternal Grandfather   . Stroke Paternal Grandmother     Social History Social History  Substance Use Topics  . Smoking status: Former Research scientist (life sciences)  . Smokeless tobacco: Never Used  . Alcohol use No    Review of Systems Constitutional: No fever/chills Eyes: No visual changes. ENT: No sore throat. Cardiovascular: Denies chest pain. Respiratory: Denies shortness of breath. Gastrointestinal: No abdominal pain.  No nausea, no vomiting.  No diarrhea.  No constipation. Genitourinary: as above Musculoskeletal: Negative for back pain. Skin: Negative for rash. Neurological: Negative for headaches, focal weakness or numbness.  10-point ROS otherwise negative.  ____________________________________________   PHYSICAL EXAM:  VITAL SIGNS: ED Triage Vitals  Enc Vitals Group     BP --      Pulse Rate 11/26/15 1359 96     Resp 11/26/15 1359 16     Temp 11/26/15 1359 98.3 F (36.8 C)     Temp Source 11/26/15 1359 Oral     SpO2 11/26/15 1359 96 %     Weight 11/26/15 1405 260 lb (117.9 kg)     Height 11/26/15 1405 6' (1.829 m)     Head Circumference --      Peak Flow --      Pain Score 11/26/15 1351 5     Pain Loc --      Pain Edu? --      Excl. in [place]? --     Constitutional: Alert and oriented. Well appearing and in no acute distress. Eyes: Conjunctivae are normal. PERRL. EOMI. Head: Atraumatic. Nose: No congestion/rhinnorhea. Mouth/Throat: Mucous  membranes are moist.   Neck: No stridor.   Cardiovascular: Normal rate, regular rhythm. Grossly normal heart sounds.  Good peripheral circulation With intact and equal dorsalis pedis pulses. Respiratory: Normal respiratory effort.  No retractions. Lungs CTAB. Gastrointestinal: Soft and nontender. No distention.  No CVA tenderness. Musculoskeletal: No lower extremity tenderness.  Mild edema to the bilateral lower extremities.  No joint effusions.Several small skin tears less than 1 cm to the right elbow dorsally. Full range of motion to the right elbow without any swelling or deformity.   Neurologic:  Normal speech and language. No gross focal neurologic deficits are appreciated.  I walked with the patient but he was unable to walk more than 3 steps and this was with holding his bilateral hands.  Skin:  Skin is warm, dry.  Several crusted lesions to the right lower extremity that do not go above the mid thigh but extend down to the mid calf which appear to be correlating with the L4 dermatome. Psychiatric: Mood and affect are normal. Speech and behavior are normal.  ____________________________________________   LABS (all labs ordered are listed, but only abnormal results are displayed)  Labs Reviewed  CBC - Abnormal; Notable for the following:       Result Value   RBC 3.56 (*)    Hemoglobin 10.9 (*)    HCT 32.5 (*)    RDW 17.9 (*)    All other components within normal limits  COMPREHENSIVE METABOLIC PANEL - Abnormal; Notable for the following:    Glucose, Bld 102 (*)    BUN 41 (*)    Creatinine, Ser 2.17 (*)    ALT 16 (*)    GFR calc non Af Amer 25 (*)    GFR calc Af Amer 29 (*)    All other components within normal limits  URINALYSIS COMPLETEWITH MICROSCOPIC ([facility] ONLY) - Abnormal; Notable for the following:    Color, Urine YELLOW (*)    APPearance CLEAR (*)    All other components within normal limits  TSH - Abnormal; Notable for the following:    TSH 17.974 (*)    All other  components within normal limits  TROPONIN I  T4, FREE  CBC  CREATININE, SERUM   ____________________________________________  EKG  ED ECG REPORT I, Doran Stabler, the attending physician, personally viewed and interpreted this ECG.   Date: 11/26/2015  EKG Time: 1404  Rate: 92  Rhythm: normal sinus rhythm with premature atrial complexes  Axis: Normal  Intervals:none  ST&T Change: No ST segment elevation or depression. No abnormal T-wave inversion.  ____________________________________________  RADIOLOGY  DG Chest 2 View (Accession [id number]) (Order [id number])  Imaging  Date: 11/26/2015 Department: [facility] EMERGENC[facility] DEPARTMENT Released By/Authorizing: Orbie Pyo, MD (auto-released)  Exam Information   Status Exam Begun  Exam Ended   Final [99] 11/26/2015 4:47 PM 11/26/2015 4:58 PM  PACS Images   Show images for DG Chest 2 View  Study Result   CLINICAL DATA:  Weakness, cough and shortness of breath.  EXAM: CHEST  2 VIEW  COMPARISON:  Chest radiograph 05/05/2015  FINDINGS: Median sternotomy wires are again seen. Cardiomediastinal contours are unchanged. No pneumothorax or sizable pleural effusion. Mild opacities in the left lung base appear unchanged. No focal consolidation or pulmonary edema.  IMPRESSION: No active cardiopulmonary disease.   Electronically Signed   By: Ulyses Jarred M.D.   On: 11/26/2015 17:09     ____________________________________________   PROCEDURES  Procedure(s) performed:   Procedures  Critical Care performed:   ____________________________________________   INITIAL IMPRESSION / ASSESSMENT AND PLAN / ED COURSE  Pertinent labs & imaging results that were available during my care of the patient were reviewed by me and considered in my medical decision making (see chart for details).  ----------------------------------------- 7:16 PM on  11/26/2015 -----------------------------------------  Patient found to have elevated TSH. Free T4 pending. Feel that this is more likely cause of his weakness then Bobbye [place].  Will be admitted to the hospital. Signed out to Dr. Lavetta Nielsen with whom  I discussed orders. I attempted to consult endocrinology but we do not have an endocrine doctor on call here at [facility] and there is also no endocrine doctor on call for [facility]. Dr. Lavetta Nielsen to place orders.  Clinical Course     ____________________________________________   FINAL CLINICAL IMPRESSION(S) / ED DIAGNOSES  Weakness. Hypothyroidism.    NEW MEDICATIONS STARTED DURING THIS VISIT:  New Prescriptions   No medications on file     Note:  This document was prepared using Dragon voice recognition software and may include unintentional dictation errors.    Orbie Pyo, MD 11/26/15 [phone]  Explain the plan as well as the diagnosis of the patient and the family and they're understanding like to comply.    Orbie Pyo, MD 11/26/15 [phone]

## 2015-11-27 ENCOUNTER — Observation Stay: Payer: Medicare HMO

## 2015-11-27 LAB — CBC
HEMATOCRIT: 32.8 % — AB (ref 40.0–52.0)
HEMOGLOBIN: 10.6 g/dL — AB (ref 13.0–18.0)
MCH: 29.7 pg (ref 26.0–34.0)
MCHC: 32.4 g/dL (ref 32.0–36.0)
MCV: 91.8 fL (ref 80.0–100.0)
Platelets: 165 10*3/uL (ref 150–440)
RBC: 3.58 MIL/uL — AB (ref 4.40–5.90)
RDW: 17.3 % — ABNORMAL HIGH (ref 11.5–14.5)
WBC: 6.3 10*3/uL (ref 3.8–10.6)

## 2015-11-27 LAB — COMPREHENSIVE METABOLIC PANEL
ALBUMIN: 3.5 g/dL (ref 3.5–5.0)
ALT: 14 U/L — ABNORMAL LOW (ref 17–63)
ANION GAP: 5 (ref 5–15)
AST: 20 U/L (ref 15–41)
Alkaline Phosphatase: 62 U/L (ref 38–126)
BILIRUBIN TOTAL: 0.4 mg/dL (ref 0.3–1.2)
BUN: 38 mg/dL — ABNORMAL HIGH (ref 6–20)
CO2: 28 mmol/L (ref 22–32)
Calcium: 8.6 mg/dL — ABNORMAL LOW (ref 8.9–10.3)
Chloride: 107 mmol/L (ref 101–111)
Creatinine, Ser: 1.95 mg/dL — ABNORMAL HIGH (ref 0.61–1.24)
GFR calc Af Amer: 33 mL/min — ABNORMAL LOW (ref >60)
GFR calc non Af Amer: 29 mL/min — ABNORMAL LOW (ref >60)
GLUCOSE: 100 mg/dL — AB (ref 65–99)
POTASSIUM: 4.2 mmol/L (ref 3.5–5.1)
SODIUM: 140 mmol/L (ref 135–145)
TOTAL PROTEIN: 6.4 g/dL — AB (ref 6.5–8.1)

## 2015-11-27 LAB — TSH: TSH: 12.16 u[IU]/mL — ABNORMAL HIGH (ref 0.350–4.500)

## 2015-11-27 LAB — VITAMIN D 25 HYDROXY (VIT D DEFICIENCY, FRACTURES): VIT D 25 HYDROXY: 49.9 ng/mL (ref 30.0–100.0)

## 2015-11-27 NOTE — Evaluation (Signed)
Physical Therapy Evaluation Patient Details Name: Gary Grant MRN: [id number] DOB: 1923-12-10 Today's Date: 11/27/2015   History of Present Illness  82 yo male with onset of generalized weakness, lives with his daughter.  Pt admitted for weakness that led to 2 falls yesterday, one as he was getting out of the car from a flu shot at the doctor's office.  PMHx:  SSS, a-fib, CAD, COPD, PAD,   Clinical Impression  Pt is notably weak to get OOB along with soreness from falling yesterday.  He is fearful of another fall and discussed the possibility of going to inpt therapy if it can be arranged.  He is not safe to be in home when daughter is at work and will need supervised gait assistance for now.  Continue acutely to progress to home faster from rehab.    Follow Up Recommendations SNF    Equipment Recommendations  None recommended by PT    Recommendations for Other Services Rehab consult     Precautions / Restrictions Precautions Precautions: Fall Restrictions Weight Bearing Restrictions: No      Mobility  Bed Mobility Overal bed mobility: Needs Assistance Bed Mobility: Supine to Sit     Supine to sit: Mod assist     General bed mobility comments: can attain a safe sitting posture once up  Transfers Overall transfer level: Needs assistance Equipment used: Rolling walker (2 wheeled);1 person hand held assist Transfers: Sit to/from [facility] Sit to Stand: Mod assist Stand pivot transfers: Min assist       General transfer comment: mod to power up and min once up to pivot to chair  Ambulation/Gait Ambulation/Gait assistance: Min assist Ambulation Distance (Feet): 12 Feet Assistive device: Rolling walker (2 wheeled);1 person hand held assist Gait Pattern/deviations: Step-to pattern;Step-through pattern;Wide base of support;Shuffle;Decreased stride length Gait velocity: reduced Gait velocity interpretation: Below normal speed for  age/gender General Gait Details: pt is moving slowly due to his recent falls and fear that his legs will buckle  Stairs            Wheelchair Mobility    Modified Rankin (Stroke Patients Only)       Balance Overall balance assessment: Needs assistance;History of Falls Sitting-balance support: Feet supported Sitting balance-Leahy Scale: Fair     Standing balance support: Bilateral upper extremity supported Standing balance-Leahy Scale: Poor                               Pertinent Vitals/Pain Pain Assessment: Faces Faces Pain Scale: Hurts little more Pain Location: R shoulder from his falls Pain Descriptors / Indicators: Aching Pain Intervention(s): Limited activity within patient's tolerance;Monitored during session;Repositioned (avoided assisting on that arm and did not use on bedrail)    Home Living Family/patient expects to be discharged to:: Skilled nursing facility Living Arrangements: Children                    Prior Function Level of Independence: Independent with assistive device(s)         Comments: RW inside and SPC outdoors     Hand Dominance        Extremity/Trunk Assessment   Upper Extremity Assessment: Generalized weakness           Lower Extremity Assessment: Generalized weakness      Cervical / Trunk Assessment: Kyphotic  Communication   Communication: HOH  Cognition Arousal/Alertness: Awake/alert Behavior During Therapy: WFL for tasks assessed/performed Overall  Cognitive Status: Within Functional Limits for tasks assessed                      General Comments General comments (skin integrity, edema, etc.): pt is uncomfortable from the fall and is noted to have some weakness that may have preceded his falls, as well as stiffness in ankles and hips, knees in -20 deg ext    Exercises     Assessment/Plan    PT Assessment Patient needs continued PT services  PT Problem List Decreased  strength;Decreased range of motion;Decreased activity tolerance;Decreased balance;Decreased mobility;Decreased coordination;Decreased knowledge of use of DME;Decreased safety awareness;Obesity;Pain;Decreased skin integrity          PT Treatment Interventions DME instruction;Gait training;Stair training;Therapeutic activities;Functional mobility training;Therapeutic exercise;Balance training;Neuromuscular re-education;Patient/family education    PT Goals (Current goals can be found in the Care Plan section)  Acute Rehab PT Goals Patient Stated Goal: to be safe walking PT Goal Formulation: With patient Time For Goal Achievement: 12/11/15 Potential to Achieve Goals: Good    Frequency Min 2X/week   Barriers to discharge Inaccessible home environment;Decreased caregiver support (daughter works at night, has 2 steps to enter house)      Co-evaluation               End of Session Equipment Utilized During Treatment: Gait belt Activity Tolerance: Patient limited by fatigue Patient left: in chair;with call bell/phone within reach;with chair alarm set Nurse Communication: Mobility status    Functional Assessment Tool Used: clinical judgment Functional Limitation: Mobility: Walking and moving around Mobility: Walking and Moving Around Current Status [phone]): At least 20 percent but less than 40 percent impaired, limited or restricted Mobility: Walking and Moving Around Goal Status [phone]): At least 1 percent but less than 20 percent impaired, limited or restricted    Time: 1225-1301 PT Time Calculation (min) (ACUTE ONLY): 36 min   Charges:   PT Evaluation $PT Eval Moderate Complexity: 1 Procedure PT Treatments $Gait Training: 8-22 mins   PT G Codes:   PT G-Codes **NOT FOR INPATIENT CLASS** Functional Assessment Tool Used: clinical judgment Functional Limitation: Mobility: Walking and moving around Mobility: Walking and Moving Around Current Status (J6734): At least 20  percent but less than 40 percent impaired, limited or restricted Mobility: Walking and Moving Around Goal Status [phone]): At least 1 percent but less than 20 percent impaired, limited or restricted    Ramond Dial 11/27/2015, 2:10 PM  Mee Hives, PT MS Acute Rehab Dept. Number: [place]

## 2015-11-27 NOTE — Care Management Obs Status (Signed)
[facility] NOTIFICATION   Patient Details  Name: Gary Grant MRN: [id number] Date of Birth: 11/06/1923   Medicare Observation Status Notification Given:  Yes (contact precautions /copy left at bedside)    Ival Bible, RN 11/27/2015, 5:42 PM

## 2015-11-27 NOTE — Progress Notes (Signed)
[facility] [place] NAME: Korban Shearer    MR#:  [id number]  DATE OF BIRTH:  Jun 03, 1923  SUBJECTIVE:  CHIEF COMPLAINT:   Chief Complaint  Patient presents with  . Weakness  The patient is a 82 year old Caucasian male with [place] history significant for history of coronary artery disease, chronic atrial fibrillation, sick ED, COPD, hypertension, hypothyroidism, peripheral vascular disease, sick sinus syndrome, who presents to the hospital with complaints of lower extremities, giving out, lower extremity weakness, falls. He was unable to ablate in emergency room. He admits of lower back pain as well, admits of pain improvement with bending or sitting, worsening when standing  Review of Systems  Constitutional: Negative for chills, fever and weight loss.  HENT: Negative for congestion.   Eyes: Negative for blurred vision and double vision.  Respiratory: Negative for cough, sputum production, shortness of breath and wheezing.   Cardiovascular: Negative for chest pain, palpitations, orthopnea, leg swelling and PND.  Gastrointestinal: Negative for abdominal pain, blood in stool, constipation, diarrhea, nausea and vomiting.  Genitourinary: Negative for dysuria, frequency, hematuria and urgency.  Musculoskeletal: Positive for back pain. Negative for falls.  Neurological: Positive for weakness. Negative for dizziness, tremors, focal weakness and headaches.  Endo/Heme/Allergies: Does not bruise/bleed easily.  Psychiatric/Behavioral: Negative for depression. The patient does not have insomnia.     VITAL SIGNS: Blood pressure 132/60, pulse 62, temperature 98 F (36.7 C), temperature source Oral, resp. rate 20, height 6' (1.829 m), weight 111.7 kg (246 lb 3.2 oz), SpO2 99 %.  PHYSICAL EXAMINATION:   GENERAL:  82 y.o.-year-old patient lying in the bed with no acute distress.  EYES: Pupils equal, round, reactive to light and accommodation. No  scleral icterus. Extraocular muscles intact.  HEENT: Head atraumatic, normocephalic. Oropharynx and nasopharynx clear.  NECK:  Supple, no jugular venous distention. No thyroid enlargement, no tenderness.  LUNGS: Normal breath sounds bilaterally, no wheezing, rales,rhonchi or crepitation. No use of accessory muscles of respiration.  CARDIOVASCULAR: S1, S2 normal. No murmurs, rubs, or gallops.  ABDOMEN: Soft, nontender, nondistended. Bowel sounds present. No organomegaly or mass.  EXTREMITIES: No pedal edema, cyanosis, or clubbing. Peripheral pulses are good NEUROLOGIC: Cranial nerves II through XII are intact. Muscle strength 5/5 in all extremities. Sensation intact. Gait not checked. Peripheral pulses are good PSYCHIATRIC: The patient is alert and oriented x 3.  SKIN: No obvious rash, lesion, or ulcer.   ORDERS/RESULTS REVIEWED:   CBC  Recent Labs Lab 11/26/15 1411 11/27/15 0730  WBC 7.4 6.3  HGB 10.9* 10.6*  HCT 32.5* 32.8*  PLT 189 165  MCV 91.3 91.8  MCH 30.7 29.7  MCHC 33.6 32.4  RDW 17.9* 17.3*   ------------------------------------------------------------------------------------------------------------------  Chemistries   Recent Labs Lab 11/26/15 1411 11/27/15 0730  NA 138 140  K 4.0 4.2  CL 106 107  CO2 25 28  GLUCOSE 102* 100*  BUN 41* 38*  CREATININE 2.17* 1.95*  CALCIUM 8.9 8.6*  AST 21 20  ALT 16* 14*  ALKPHOS 61 62  BILITOT 0.6 0.4   ------------------------------------------------------------------------------------------------------------------ estimated creatinine clearance is 33.1 mL/min (by C-G formula based on SCr of 1.95 mg/dL (H)). ------------------------------------------------------------------------------------------------------------------  Recent Labs  11/27/15 0730  TSH 12.160*    Cardiac Enzymes  Recent Labs Lab 11/26/15 1411  TROPONINI <0.03    ------------------------------------------------------------------------------------------------------------------ Invalid input(s): POCBNP ---------------------------------------------------------------------------------------------------------------  RADIOLOGY: Dg Chest 2 View  Result Date: 11/26/2015 CLINICAL DATA:  Weakness, cough and shortness of breath. EXAM: CHEST  2 VIEW COMPARISON:  Chest radiograph 05/05/2015 FINDINGS: Median sternotomy wires are again seen. Cardiomediastinal contours are unchanged. No pneumothorax or sizable pleural effusion. Mild opacities in the left lung base appear unchanged. No focal consolidation or pulmonary edema. IMPRESSION: No active cardiopulmonary disease. Electronically Signed   By: Ulyses Jarred M.D.   On: 11/26/2015 17:09    EKG:  Orders placed or performed during the hospital encounter of 11/26/15  . EKG 12-Lead  . EKG 12-Lead    ASSESSMENT AND PLAN:  Active Problems:   Weakness #1. Lower extremity weakness and falls, getting physical therapist again involved, getting lumbar MRI to rule out lumbar spinal stenosis is contributing factor to his weakness, following clinically #2. Bradycardia, discontinue metoprolol #3. Acute on chronic renal failure, improved with IV fluid administration #4. Anemia, stable, get Hemoccult #5. Hyperglycemia, get hemoglobin A1c #6. Hypothyroidism, Synthroid dose was advanced, free T4 however, was normal, likely sick euthyroid  Management plans discussed with the patient, family and they are in agreement.   DRUG ALLERGIES: No Known Allergies  CODE STATUS:     Code Status Orders        Start     Ordered   11/26/15 1911  Full code  Continuous     11/26/15 1910    Code Status History    Date Active Date Inactive Code Status Order ID Comments User Context   05/06/2015 12:28 AM 05/07/2015  4:50 PM Full Code [id number]  Gladstone Lighter, MD Inpatient      TOTAL TIME TAKING CARE OF THIS PATIENT: 40  minutes.    Theodoro Grist M.D on 11/27/2015 at 3:11 PM  Between 7am to 6pm - Pager - [phone]  After 6pm go to [email or web address] - password [email or web address] [place] [facility]  Office  [phone]  CC: Primary care physician; [facility]

## 2015-11-27 NOTE — Plan of Care (Signed)
Problem: Safety: Goal: Ability to remain free from injury will improve Outcome: Progressing Patient is alert and oriented. History of falls. Encouraged to call for assistance. Bed alarm in use. Remains free from injury this shift.

## 2015-11-28 ENCOUNTER — Observation Stay: Payer: Medicare HMO

## 2015-11-28 DIAGNOSIS — R531 Weakness: Secondary | ICD-10-CM | POA: Diagnosis not present

## 2015-11-28 DIAGNOSIS — G609 Hereditary and idiopathic neuropathy, unspecified: Secondary | ICD-10-CM | POA: Diagnosis not present

## 2015-11-28 LAB — VITAMIN B12: Vitamin B-12: 745 pg/mL (ref 180–914)

## 2015-11-28 LAB — HEMOGLOBIN A1C
Hgb A1c MFr Bld: 5.8 % — ABNORMAL HIGH (ref 4.8–5.6)
Mean Plasma Glucose: 120 mg/dL

## 2015-11-28 NOTE — Clinical Social Work Note (Signed)
Clinical Social Work Assessment  Patient Details  Name: Gary Grant MRN: [id number] Date of Birth: 1923-12-06  Date of referral:  11/28/15               Reason for consult:  Facility Placement                Permission sought to share information with:  Chartered certified accountant granted to share information::  Yes, Verbal Permission Granted  Name::        Agency::     Relationship::     Contact Information:     Housing/Transportation Living arrangements for the past 2 months:  Single Family Home Source of Information:  Patient, Adult Children Patient Interpreter Needed:  None Criminal Activity/Legal Involvement Pertinent to Current Situation/Hospitalization:  No - Comment as needed Significant Relationships:  Adult Children, Other Family Members, Community Support, [place] Lives with:  Adult Children Do you feel safe going back to the place where you live?  Yes Need for family participation in patient care:  No (Coment)  Care giving concerns:  STR   Social Worker assessment / plan:  Patient was resting comfortably with multiple family members at bedside when [place] visited. CSW introduced self and role in dc planning. Patient gave verbal permission for family to remain and for the CSW to conduct a SNF bed search for STR.  The patient is baseline independent with dressing, feeding, bathing, and continence. The patient ambulates with a cane, but he has a WC for low energy situations. The patient lives with his daughter, Gary Grant, and she manages his finances and medications. The patient does not use o2.  PT recs are for STR at SNF. Patient indicated that [facility] is is first choice, but he has no particular objection to any other options. CSW explained that [employer] would be contacted for auth once bed offers were determined, as well as date of dc. The patient and his family understood.  Employment status:  Retired Forensic scientist:  [employer] PT  Recommendations:  [place] / Referral to community resources:  [place]  Patient/Family's Response to care:  The patient and family were particularly pleasant and thanked the CSW for involvement.  Patient/Family's Understanding of and Emotional Response to Diagnosis, Current Treatment, and Prognosis:  The patient and his family were able to verbalize the dc plan in their own terms.  Emotional Assessment Appearance:  Appears younger than stated age Attitude/Demeanor/Rapport:   (Incredibly pleasant) Affect (typically observed):  Accepting, Appropriate, Pleasant Orientation:  Oriented to Self, Oriented to Place, Oriented to  Time, Oriented to Situation Alcohol / Substance use:  Never Used Psych involvement (Current and /or in the community):  No (Comment)  Discharge Needs  Concerns to be addressed:  Discharge Planning Concerns Readmission within the last 30 days:  No Current discharge risk:  None Barriers to Discharge:  Continued Medical Work up   [employer], LCSW 11/28/2015, 4:58 PM

## 2015-11-28 NOTE — Progress Notes (Addendum)
[facility] [place] NAME: Gary Grant    MR#:  [id number]  DATE OF BIRTH:  [birth year]  SUBJECTIVE:  CHIEF COMPLAINT:   Chief Complaint  Patient presents with  . Weakness  The patient is a 82 year old Caucasian male with [place] history significant for history of coronary artery disease, chronic atrial fibrillation, sick ED, COPD, hypertension, hypothyroidism, peripheral vascular disease, sick sinus syndrome, who presents to the hospital with complaints of lower extremities, giving out, lower extremity weakness, falls. He was unable to ablate in emergency room. He admits of lower back pain as well, admits of pain improvement with bending or sitting, worsening when standing. Lumbar spine MRI was unremarkable, no acute changes. Patient was seen by neurologist, recommended Z00, folic acid levels, hemoglobin A1c, possibly outpatient EMG/nerve conduction testing to rule out myopathies. The patient complains of lower thoracic pain on palpation of thoracic spine today  Review of Systems  Constitutional: Negative for chills, fever and weight loss.  HENT: Negative for congestion.   Eyes: Negative for blurred vision and double vision.  Respiratory: Negative for cough, sputum production, shortness of breath and wheezing.   Cardiovascular: Negative for chest pain, palpitations, orthopnea, leg swelling and PND.  Gastrointestinal: Negative for abdominal pain, blood in stool, constipation, diarrhea, nausea and vomiting.  Genitourinary: Negative for dysuria, frequency, hematuria and urgency.  Musculoskeletal: Positive for back pain. Negative for falls.  Neurological: Positive for weakness. Negative for dizziness, tremors, focal weakness and headaches.  Endo/Heme/Allergies: Does not bruise/bleed easily.  Psychiatric/Behavioral: Negative for depression. The patient does not have insomnia.     VITAL SIGNS: Blood pressure (!) 157/62, pulse 77,  temperature 97.8 F (36.6 C), temperature source Oral, resp. rate 17, height 6' (1.829 m), weight 111.7 kg (246 lb 3.2 oz), SpO2 94 %.  PHYSICAL EXAMINATION:   GENERAL:  82 y.o.-year-old patient lying in the bed with no acute distress.  EYES: Pupils equal, round, reactive to light and accommodation. No scleral icterus. Extraocular muscles intact.  HEENT: Head atraumatic, normocephalic. Oropharynx and nasopharynx clear.  NECK:  Supple, no jugular venous distention. No thyroid enlargement, no tenderness.  LUNGS: Normal breath sounds bilaterally, no wheezing, rales,rhonchi or crepitation. No use of accessory muscles of respiration.  CARDIOVASCULAR: S1, S2 normal. No murmurs, rubs, or gallops.  ABDOMEN: Soft, nontender, nondistended. Bowel sounds present. No organomegaly or mass.  EXTREMITIES: No pedal edema, cyanosis, or clubbing. Peripheral pulses are good thoracic spine palpation reveals significant tenderness in the lower part of thoracic spine.  NEUROLOGIC: Cranial nerves II through XII are intact. Muscle strength 5/5 in all extremities. Sensation intact. Gait not checked. Peripheral pulses are good PSYCHIATRIC: The patient is alert and oriented x 3.  SKIN: No obvious rash, lesion, or ulcer.   ORDERS/RESULTS REVIEWED:   CBC  Recent Labs Lab 11/26/15 1411 11/27/15 0730  WBC 7.4 6.3  HGB 10.9* 10.6*  HCT 32.5* 32.8*  PLT 189 165  MCV 91.3 91.8  MCH 30.7 29.7  MCHC 33.6 32.4  RDW 17.9* 17.3*   ------------------------------------------------------------------------------------------------------------------  Chemistries   Recent Labs Lab 11/26/15 1411 11/27/15 0730  NA 138 140  K 4.0 4.2  CL 106 107  CO2 25 28  GLUCOSE 102* 100*  BUN 41* 38*  CREATININE 2.17* 1.95*  CALCIUM 8.9 8.6*  AST 21 20  ALT 16* 14*  ALKPHOS 61 62  BILITOT 0.6 0.4   ------------------------------------------------------------------------------------------------------------------ estimated  creatinine clearance is 33.1 mL/min (by C-G formula based on SCr  of 1.95 mg/dL (H)). ------------------------------------------------------------------------------------------------------------------  Recent Labs  11/27/15 0730  TSH 12.160*    Cardiac Enzymes  Recent Labs Lab 11/26/15 1411  TROPONINI <0.03   ------------------------------------------------------------------------------------------------------------------ Invalid input(s): POCBNP ---------------------------------------------------------------------------------------------------------------  RADIOLOGY: Dg Chest 2 View  Result Date: 11/26/2015 CLINICAL DATA:  Weakness, cough and shortness of breath. EXAM: CHEST  2 VIEW COMPARISON:  Chest radiograph 05/05/2015 FINDINGS: Median sternotomy wires are again seen. Cardiomediastinal contours are unchanged. No pneumothorax or sizable pleural effusion. Mild opacities in the left lung base appear unchanged. No focal consolidation or pulmonary edema. IMPRESSION: No active cardiopulmonary disease. Electronically Signed   By: Ulyses Jarred M.D.   On: 11/26/2015 17:09   Mr Lumbar Spine Wo Contrast  Result Date: 11/27/2015 CLINICAL DATA:  New onset lower extremity weakness.  Multiple falls. EXAM: MRI LUMBAR SPINE WITHOUT CONTRAST TECHNIQUE: Multiplanar, multisequence MR imaging of the lumbar spine was performed. No intravenous contrast was administered. COMPARISON:  None. FINDINGS: Segmentation: Normal. The lowest disc space is considered to be L5-S1. Alignment:  Normal Vertebrae: No acute compression fracture, facet edema or focal marrow lesion. Conus medullaris: Extends to the L1 level and appears normal. Paraspinal and other soft tissues: The visualized aorta, IVC and iliac vessels are normal. The visualized retroperitoneal organs and paraspinal soft tissues are normal. Disc levels: T11-T12: Evaluated on sagittal images only. No significant disc herniation, spinal canal stenosis or  neuroforaminal narrowing. T12-L1:  Normal L1-L2: Normal disc space and facet joints. No spinal canal stenosis. No neuroforaminal stenosis. L2-L3: Small diffuse disc bulge and bilateral moderate facet hypertrophy. Prominent Schmorl's node within the inferior endplate of L2. No spinal canal stenosis. Mild left neuroforaminal stenosis. L3-L4: Mild diffuse disc bulge and moderate bilateral facet hypertrophy. No spinal canal stenosis. No neuroforaminal stenosis. L4-L5: Moderate diffuse disc bulge with mild narrowing of both lateral recesses. Moderate facet hypertrophy. No spinal canal stenosis. Mild bilateral neuroforaminal stenosis. L5-S1: Mild disc bulge, right eccentric. No spinal canal stenosis. Moderate right and mild left neuroforaminal stenosis. IMPRESSION: 1. Multilevel degenerative disc disease and facet arthrosis with mild-to-moderate neural foraminal narrowing, greatest at right L5-S1. 2. No spinal canal stenosis. 3. Mild bilateral lateral recess narrowing at L4-L5. Correlate for symptoms of L5 radiculopathy. Electronically Signed   By: Ulyses Jarred M.D.   On: 11/27/2015 19:36    EKG:  Orders placed or performed during the hospital encounter of 11/26/15  . EKG 12-Lead  . EKG 12-Lead    ASSESSMENT AND PLAN:  Active Problems:   Weakness #1. Lower extremity weakness and falls, physical therapis recommended skilled nursing facility placement, appreciate neurologist input, obtaining B12, folate acid level, hemoglobin A1c , lumbar MRI  revealed old changes, obtaining thoracic MRI #2. Bradycardia, resolved stopping beta blocker #3. Acute renal failure, improved with IV fluid administration #4. Anemia, stable, Hemoccult is ordered, pending  #5. Hyperglycemia, hemoglobin A1c is 5.8, no diabetes #6. Hypothyroidism, Synthroid dose was advanced, free T4 however, was normal, likely sick euthyroid, repeat TSH and free T4 in about 6 weeks as outpatient  #7. Back pain, lumbar spine MRI was unremarkable,  old changes, getting lower thoracic MRI, supportive therapy  Management plans discussed with the patient, family and they are in agreement.   DRUG ALLERGIES: No Known Allergies  CODE STATUS:     Code Status Orders        Start     Ordered   11/26/15 1911  Full code  Continuous     11/26/15 1910    Code Status History  Date Active Date Inactive Code Status Order ID Comments User Context   05/06/2015 12:28 AM 05/07/2015  4:50 PM Full Code [id number]  Gladstone Lighter, MD Inpatient      TOTAL TIME TAKING CARE OF THIS PATIENT: 35 minutes.    Theodoro Grist M.D on 11/28/2015 at 1:00 PM  Between 7am to 6pm - Pager - [phone]  After 6pm go to [email or web address] - password [email or web address] [place] [facility]  Office  [phone]  CC: Primary care physician; [facility]

## 2015-11-28 NOTE — Consult Note (Signed)
Reason for Consult:Leg weakness  Referring Physician: Dr. Ether Griffins  CC: lower extremity weakness   HPI: Gary Grant is an 82 y.o. male  with a known history of Hypothyroidism unspecified who is presenting with weakness. He states progressive weakness over the past week, lower extremity weakness he has suffered numerous falls. He is now using a cane for the last month. He also has issues with constipation lately. Came to the hospital given cluster of symptoms and 2 falls today. Unable to ambulate in emergency department  Past Medical History:  Diagnosis Date  . CAD (coronary artery disease)   . Chronic a-fib ([place])   . CKD (chronic kidney disease)   . COPD (chronic obstructive pulmonary disease) ([place])   . GERD (gastroesophageal reflux disease)   . HTN (hypertension)   . Hypothyroidism   . PAD (peripheral artery disease) ([place])   . Prostate cancer ([place])   . SSS (sick sinus syndrome) ([place])   . Venous insufficiency     Past Surgical History:  Procedure Laterality Date  . CARDIAC SURGERY     bypass  . EYE SURGERY    . FRACTURE SURGERY      Family History  Problem Relation Age of Onset  . Osteoporosis Mother   . Heart Problems Father   . Stroke Maternal Grandfather   . Stroke Paternal Grandfather   . Stroke Paternal Grandmother     Social History:  reports that he has quit smoking. He has never used smokeless tobacco. He reports that he does not drink alcohol or use drugs.  No Known Allergies  Medications: I have reviewed the patient's current medications.  ROS: History obtained from the patient  General ROS: negative for - chills, fatigue, fever, night sweats, weight gain or weight loss Psychological ROS: negative for - behavioral disorder, hallucinations, memory difficulties, mood swings or suicidal ideation Ophthalmic ROS: negative for - blurry vision, double vision, eye pain or loss of vision ENT ROS: negative for - epistaxis, nasal discharge, oral lesions, sore  throat, tinnitus or vertigo Allergy and Immunology ROS: negative for - hives or itchy/watery eyes Hematological and Lymphatic ROS: negative for - bleeding problems, bruising or swollen lymph nodes Endocrine ROS: negative for - galactorrhea, hair pattern changes, polydipsia/polyuria or temperature intolerance Respiratory ROS: negative for - cough, hemoptysis, shortness of breath or wheezing Cardiovascular ROS: negative for - chest pain, dyspnea on exertion, edema or irregular heartbeat Gastrointestinal ROS: negative for - abdominal pain, diarrhea, hematemesis, nausea/vomiting or stool incontinence Genito-Urinary ROS: negative for - dysuria, hematuria, incontinence or urinary frequency/urgency Musculoskeletal ROS: negative for - joint swelling or muscular weakness Neurological ROS: as noted in HPI Dermatological ROS: negative for rash and skin lesion changes  Physical Examination: Blood pressure (!) 157/62, pulse 77, temperature 97.8 F (36.6 C), temperature source Oral, resp. rate 17, height 6' (1.829 m), weight 111.7 kg (246 lb 3.2 oz), SpO2 94 %.    Neurological Examination Mental Status: Alert, oriented, thought content appropriate.  Speech fluent without evidence of aphasia.  Able to follow 3 step commands without difficulty. Cranial Nerves: II: Discs flat bilaterally; Visual fields grossly normal, pupils equal, round, reactive to light and accommodation III,IV, VI: ptosis not present, extra-ocular motions intact bilaterally V,VII: smile symmetric, facial light touch sensation normal bilaterally VIII: hearing normal bilaterally IX,X: gag reflex present XI: bilateral shoulder shrug XII: midline tongue extension Motor: Right : Upper extremity   5/5    Left:     Upper extremity   5/5  Lower  extremities bilaterally proximally 3/5 distally 4/5. Tone and bulk:normal tone throughout; no atrophy noted Sensory: Decreased sensation b/l lower extremities below knee Deep Tendon Reflexes: 1+  and symmetric throughout Plantars: Right: downgoing   Left: downgoing Cerebellar: normal finger-to-nose, Gait: not tested       Laboratory Studies:   Basic Metabolic Panel:  Recent Labs Lab 11/26/15 1411 11/27/15 0730  NA 138 140  K 4.0 4.2  CL 106 107  CO2 25 28  GLUCOSE 102* 100*  BUN 41* 38*  CREATININE 2.17* 1.95*  CALCIUM 8.9 8.6*    Liver Function Tests:  Recent Labs Lab 11/26/15 1411 11/27/15 0730  AST 21 20  ALT 16* 14*  ALKPHOS 61 62  BILITOT 0.6 0.4  PROT 7.0 6.4*  ALBUMIN 3.9 3.5   No results for input(s): LIPASE, AMYLASE in the last 168 hours. No results for input(s): AMMONIA in the last 168 hours.  CBC:  Recent Labs Lab 11/26/15 1411 11/27/15 0730  WBC 7.4 6.3  HGB 10.9* 10.6*  HCT 32.5* 32.8*  MCV 91.3 91.8  PLT 189 165    Cardiac Enzymes:  Recent Labs Lab 11/26/15 1411  TROPONINI <0.03    BNP: Invalid input(s): POCBNP  CBG: No results for input(s): GLUCAP in the last 168 hours.  Microbiology: Results for orders placed or performed during the hospital encounter of 05/05/15  Blood culture (routine x 2)     Status: None   Collection Time: 05/05/15  8:15 PM  Result Value Ref Range Status   Specimen Description BLOOD LEFT  Final   Special Requests BOTTLES DRAWN AEROBIC AND ANAEROBIC 7ML  Final   Culture NO GROWTH 5 DAYS  Final   Report Status 05/10/2015 FINAL  Final  Blood culture (routine x 2)     Status: None   Collection Time: 05/05/15  8:20 PM  Result Value Ref Range Status   Specimen Description BLOOD LEFT HAND  Final   Special Requests BOTTLES DRAWN AEROBIC AND ANAEROBIC 8ML  Final   Culture NO GROWTH 5 DAYS  Final   Report Status 05/10/2015 FINAL  Final    Coagulation Studies: No results for input(s): LABPROT, INR in the last 72 hours.  Urinalysis:  Recent Labs Lab 11/26/15 1734  COLORURINE YELLOW*  LABSPEC 1.012  PHURINE 5.0  GLUCOSEU NEGATIVE  HGBUR NEGATIVE  BILIRUBINUR NEGATIVE  KETONESUR  NEGATIVE  PROTEINUR NEGATIVE  NITRITE NEGATIVE  LEUKOCYTESUR NEGATIVE    Lipid Panel:     Component Value Date/Time   CHOL 110 09/08/2012 0015   TRIG 69 09/08/2012 0015   HDL 41 09/08/2012 0015   VLDL 14 09/08/2012 0015   LDLCALC 55 09/08/2012 0015    HgbA1C: No results found for: HGBA1C  Urine Drug Screen:  No results found for: LABOPIA, COCAINSCRNUR, LABBENZ, AMPHETMU, THCU, LABBARB  Alcohol Level: No results for input(s): ETH in the last 168 hours.  Other results: EKG: normal EKG, normal sinus rhythm, unchanged from previous tracings.  Imaging: Dg Chest 2 View  Result Date: 11/26/2015 CLINICAL DATA:  Weakness, cough and shortness of breath. EXAM: CHEST  2 VIEW COMPARISON:  Chest radiograph 05/05/2015 FINDINGS: Median sternotomy wires are again seen. Cardiomediastinal contours are unchanged. No pneumothorax or sizable pleural effusion. Mild opacities in the left lung base appear unchanged. No focal consolidation or pulmonary edema. IMPRESSION: No active cardiopulmonary disease. Electronically Signed   By: Ulyses Jarred M.D.   On: 11/26/2015 17:09   Mr Lumbar Spine Wo Contrast  Result Date: 11/27/2015 CLINICAL  DATA:  New onset lower extremity weakness.  Multiple falls. EXAM: MRI LUMBAR SPINE WITHOUT CONTRAST TECHNIQUE: Multiplanar, multisequence MR imaging of the lumbar spine was performed. No intravenous contrast was administered. COMPARISON:  None. FINDINGS: Segmentation: Normal. The lowest disc space is considered to be L5-S1. Alignment:  Normal Vertebrae: No acute compression fracture, facet edema or focal marrow lesion. Conus medullaris: Extends to the L1 level and appears normal. Paraspinal and other soft tissues: The visualized aorta, IVC and iliac vessels are normal. The visualized retroperitoneal organs and paraspinal soft tissues are normal. Disc levels: T11-T12: Evaluated on sagittal images only. No significant disc herniation, spinal canal stenosis or neuroforaminal  narrowing. T12-L1:  Normal L1-L2: Normal disc space and facet joints. No spinal canal stenosis. No neuroforaminal stenosis. L2-L3: Small diffuse disc bulge and bilateral moderate facet hypertrophy. Prominent Schmorl's node within the inferior endplate of L2. No spinal canal stenosis. Mild left neuroforaminal stenosis. L3-L4: Mild diffuse disc bulge and moderate bilateral facet hypertrophy. No spinal canal stenosis. No neuroforaminal stenosis. L4-L5: Moderate diffuse disc bulge with mild narrowing of both lateral recesses. Moderate facet hypertrophy. No spinal canal stenosis. Mild bilateral neuroforaminal stenosis. L5-S1: Mild disc bulge, right eccentric. No spinal canal stenosis. Moderate right and mild left neuroforaminal stenosis. IMPRESSION: 1. Multilevel degenerative disc disease and facet arthrosis with mild-to-moderate neural foraminal narrowing, greatest at right L5-S1. 2. No spinal canal stenosis. 3. Mild bilateral lateral recess narrowing at L4-L5. Correlate for symptoms of L5 radiculopathy. Electronically Signed   By: Ulyses Jarred M.D.   On: 11/27/2015 19:36     Assessment/Plan: 82 y.o. male  with a known history of Hypothyroidism unspecified who is presenting with weakness. He states progressive weakness over the past week, lower extremity weakness he has suffered numerous falls. He is now using a cane for the last month. He also has issues with constipation lately. Came to the hospital given cluster of symptoms and 2 falls today. Unable to ambulate in emergency department  Pt has hisotry of venous insufficiency and peripheral arterial disease which could be contributing to his symptoms specifically decreased sensation and vibration lower extremities. MRI reviewed. Chronic changes in the LS spine. Nothing acute  Elevated TSH but normal T4, likely compensating Vitamin B12 and folate level ordered to see if this can contribute to his lower extremity weakness/numbness Obtain HbA1c Pt/ot If no  improvement and above work up negative. Pt should follow up with Neurology as out pt for EMG/Nerve conduction testing to rule out myopathies.    Leotis Pain     11/28/2015, 8:53 AM

## 2015-11-28 NOTE — NC FL2 (Signed)
[place] LEVEL OF CARE SCREENING TOOL     IDENTIFICATION  Patient Name: Gary Grant Birthdate: 01/19/1924 Sex: male Admission Date (Current Location): 11/26/2015  [place] and [place] Number:  Engineering geologist and Address:  [facility], [address], [place], [place] [address]      Provider Number: [id number]  Attending Physician Name and Address:  Theodoro Grist, MD  Relative Name and Phone Number:       Current Level of Care: Hospital Recommended Level of Care: [place] Prior Approval Number:    Date Approved/Denied: 11/28/15 PASRR Number: [id number]  Discharge Plan: SNF    Current Diagnoses: Patient Active Problem List   Diagnosis Date Noted  . Weakness 11/26/2015  . Abdominal pain, epigastric 05/07/2015  . Gout of hand 05/07/2015  . Nephrolithiasis 05/07/2015  . Cellulitis of hand 05/07/2015  . Constipation 05/07/2015  . Acute on chronic renal failure (HCC) 05/05/2015    Orientation RESPIRATION BLADDER Height & Weight     Self, Time, Situation, Place  Normal Continent Weight: 246 lb 3.2 oz (111.7 kg) Height:  6' (182.9 cm)  BEHAVIORAL SYMPTOMS/MOOD NEUROLOGICAL BOWEL NUTRITION STATUS      Continent    AMBULATORY STATUS COMMUNICATION OF NEEDS Skin   Total Care Verbally Normal                       Personal Care Assistance Level of Assistance  Bathing, Dressing Bathing Assistance: Maximum assistance         Functional Limitations Info             SPECIAL CARE FACTORS FREQUENCY  PT (By licensed PT), OT (By licensed OT)     PT Frequency: 5X day 5X week OT Frequency: 5X day 5X week            Contractures Contractures Info: Present    Additional Factors Info                  Current Medications (11/28/2015):  This is the current hospital active medication list Current Facility-Administered Medications  Medication Dose Route Frequency Provider Last Rate Last  Dose  . acetaminophen (TYLENOL) tablet 650 mg  650 mg Oral Q6H PRN Lytle Butte, MD   650 mg at 11/27/15 0410   Or  . acetaminophen (TYLENOL) suppository 650 mg  650 mg Rectal Q6H PRN Lytle Butte, MD      . aspirin EC tablet 81 mg  81 mg Oral Daily Lytle Butte, MD   81 mg at 11/28/15 2505  . atorvastatin (LIPITOR) tablet 20 mg  20 mg Oral QHS Lytle Butte, MD   20 mg at 11/27/15 2200  . cholecalciferol (VITAMIN D) tablet 1,000 Units  1,000 Units Oral Daily Lytle Butte, MD   1,000 Units at 11/28/15 [phone]  . docusate sodium (COLACE) capsule 100 mg  100 mg Oral BID Lytle Butte, MD   100 mg at 11/28/15 7341  . enalapril (VASOTEC) tablet 5 mg  5 mg Oral Daily Lytle Butte, MD   5 mg at 11/28/15 9379  . furosemide (LASIX) tablet 40 mg  40 mg Oral BID PRN Lytle Butte, MD      . heparin injection 5,000 Units  5,000 Units Subcutaneous Q8H Lytle Butte, MD   5,000 Units at 11/28/15 1427  . levothyroxine (SYNTHROID, LEVOTHROID) tablet 100 mcg  100 mcg Oral QAC breakfast Lytle Butte,  MD   100 mcg at 11/28/15 0732  . ondansetron (ZOFRAN) tablet 4 mg  4 mg Oral Q6H PRN Lytle Butte, MD       Or  . ondansetron [facility]) injection 4 mg  4 mg Intravenous Q6H PRN Lytle Butte, MD      . pantoprazole (PROTONIX) EC tablet 40 mg  40 mg Oral BID AC Lytle Butte, MD   40 mg at 11/28/15 0732  . senna (SENOKOT) tablet 8.6 mg  1 tablet Oral Daily PRN Lytle Butte, MD      . tamsulosin [facility]) capsule 0.4 mg  0.4 mg Oral Daily Lytle Butte, MD   0.4 mg at 11/28/15 1740     Discharge Medications: Please see discharge summary for a list of discharge medications.  Relevant Imaging Results:  Relevant Lab Results:   Additional Information  SS # [id number]  Zettie Pho, LCSW

## 2015-11-29 DIAGNOSIS — R296 Repeated falls: Secondary | ICD-10-CM

## 2015-11-29 DIAGNOSIS — R001 Bradycardia, unspecified: Secondary | ICD-10-CM

## 2015-11-29 DIAGNOSIS — E039 Hypothyroidism, unspecified: Secondary | ICD-10-CM

## 2015-11-29 DIAGNOSIS — T50905A Adverse effect of unspecified drugs, medicaments and biological substances, initial encounter: Secondary | ICD-10-CM

## 2015-11-29 DIAGNOSIS — D649 Anemia, unspecified: Secondary | ICD-10-CM

## 2015-11-29 DIAGNOSIS — R739 Hyperglycemia, unspecified: Secondary | ICD-10-CM

## 2015-11-29 DIAGNOSIS — R29898 Other symptoms and signs involving the musculoskeletal system: Secondary | ICD-10-CM

## 2015-11-29 DIAGNOSIS — W19XXXA Unspecified fall, initial encounter: Secondary | ICD-10-CM

## 2015-11-29 DIAGNOSIS — M5136 Other intervertebral disc degeneration, lumbar region: Secondary | ICD-10-CM

## 2015-11-29 DIAGNOSIS — E860 Dehydration: Secondary | ICD-10-CM

## 2015-11-29 DIAGNOSIS — M549 Dorsalgia, unspecified: Secondary | ICD-10-CM

## 2015-11-29 LAB — OCCULT BLOOD X 1 CARD TO LAB, STOOL: FECAL OCCULT BLD: NEGATIVE

## 2015-11-29 MED ORDER — LEVOTHYROXINE SODIUM 50 MCG PO CAPS: 50.0000 ug | ORAL_CAPSULE | Freq: Every day | ORAL | 6 refills | Status: DC

## 2015-11-29 NOTE — Clinical Social Work Note (Signed)
CSW has received bed offer from [facility] which is patient's preference. Patient has accepted this offer. Patient's information has been submitted to [facility] for review for prior auth. Patient is aware of this and he states his daughter can transport when time. [name] MSW,LCSW [phone]

## 2015-11-29 NOTE — Clinical Social Work Placement (Signed)
   CLINICAL SOCIAL WORK PLACEMENT  NOTE  Date:  11/29/2015  Patient Details  Name: Gary Grant MRN: [id number] Date of Birth: [birth year]  Clinical Social Work is seeking post-discharge placement for this patient at the [place] level of care (*CSW will initial, date and re-position this form in  chart as items are completed):  Yes   Patient/family provided with [place] Work Department's list of facilities offering this level of care within the geographic area requested by the patient (or if unable, by the patient's family).  Yes   Patient/family informed of their freedom to choose among providers that offer the needed level of care, that participate in Medicare, Medicaid or managed care program needed by the patient, have an available bed and are willing to accept the patient.  Yes   Patient/family informed of [place]'s ownership interest in [facility] and [facility], as well as of the fact that they are under no obligation to receive care at these facilities.  PASRR submitted to [employer] on 11/28/15     PASRR number received on 11/28/15     Existing PASRR number confirmed on 11/28/15     FL2 transmitted to all facilities in geographic area requested by pt/family on 11/28/15     FL2 transmitted to all facilities within larger geographic area on       Patient informed that his/her managed care company has contracts with or will negotiate with certain facilities, including the following:        Yes   Patient/family informed of bed offers received.  Patient chooses bed at  [facility])     Physician recommends and patient chooses bed at  [facility])    Patient to be transferred to  [facility]) on 11/29/15.  Patient to be transferred to facility by  (daughter)     Patient family notified on 11/29/15 of transfer.  Name of family member notified:  patient calling his daughter     PHYSICIAN       Additional Comment:     _______________________________________________ [name], LCSW 11/29/2015, 10:51 AM

## 2015-11-29 NOTE — Progress Notes (Signed)
Called Report to [facility]. Pt A and  X 4. VSS. Pt tolerating diet well no complaints of pain. IV removed intact. Patient transferred to facility with the daughter.

## 2015-11-29 NOTE — Progress Notes (Signed)
Clinical for SNF authorization sent to [facility] at [id number].

## 2015-11-29 NOTE — Progress Notes (Signed)
[place]d note for 11/29/15 sent to [facility] for consideration of SNF auth.  Faxed to [phone].

## 2015-11-29 NOTE — Discharge Summary (Signed)
[facility] NAME: Gary Grant    MR#:  [id number]  DATE OF BIRTH:  [birth year]  DATE OF ADMISSION:  11/26/2015 ADMITTING PHYSICIAN: Lytle Butte, MD  DATE OF DISCHARGE: No discharge date for patient encounter.  PRIMARY CARE PHYSICIAN: [facility]     ADMISSION DIAGNOSIS:  Weakness  DISCHARGE DIAGNOSIS:  Principal Problem:   Leg weakness, bilateral Active Problems:   Weakness   Falls   Bradycardia, drug induced   Dehydration   Hypothyroidism   Degenerative disc disease, lumbar   Back pain   Anemia   Hyperglycemia   SECONDARY DIAGNOSIS:   Past Medical History:  Diagnosis Date  . CAD (coronary artery disease)   . Chronic a-fib ([place])   . CKD (chronic kidney disease)   . COPD (chronic obstructive pulmonary disease) ([place])   . GERD (gastroesophageal reflux disease)   . HTN (hypertension)   . Hypothyroidism   . PAD (peripheral artery disease) ([place])   . Prostate cancer ([place])   . SSS (sick sinus syndrome) ([place])   . Venous insufficiency     .pro HOSPITAL COURSE:  The patient is a 82 year old Caucasian male with past medical history significant for history of coronary artery disease, chronic atrial fibrillation, sick ED, COPD, hypertension, hypothyroidism, peripheral vascular disease, sick sinus syndrome, who presents to the hospital with complaints of lower extremities, giving out, lower extremity weakness, falls. He was unable to ablate in emergency room. He admits of lower back pain as well, admits of pain improvement with bending or sitting, worsening when standing. Lumbar spine MRI was unremarkable, no acute changes. Patient was seen by neurologist, recommended N36, folic acid levels, hemoglobin A1c, possibly outpatient EMG/nerve conduction testing to rule out myopathies. The patient complained of lower thoracic pain on palpation , however thoracic spine MRI was unremarkable as well. B12 level  was normal at 745, fecal occult blood was negative, hemoglobin A1c 5.8, no diabetes, vitamin D level normal at 49.9. The patient was evaluated by physical therapist and recommended rehabilitation placement.  Discussion by problem: #1. Lower extremity weakness and falls, physical therapist recommended skilled nursing facility placement, appreciate neurologist input, normal B12, folate acid level is pending, hemoglobin A1c 5.8, no diabetes , lumbar MRI  revealed old changes, thoracic MRI also. No acute changes, supportive therapy, rehabilitation placement, possibly today or tomorrow, discussed with social worker #2. Bradycardia, resolved stopping beta blocker and advancing Synthroid dose #3. Acute on chronic renal failure, improved with IV fluid administration, likely dehydration related, patient was advised to drink plenty of fluids, continue diuretic only as needed #4. Anemia, stable, Hemoccult is ordered, negative #5. Hyperglycemia, hemoglobin A1c is 5.8, no diabetes #6. Hypothyroidism, Synthroid dose was advanced, free T4 however, was normal, likely sick euthyroid, repeat TSH and free T4 in about 6 weeks as outpatient  #7. Back pain, lumbar and thoracic spine MRI were unremarkable, old changes, supportive therapy   DISCHARGE CONDITIONS:   Stable  CONSULTS OBTAINED:  Treatment Team:  Lytle Butte, MD Leotis Pain, MD  DRUG ALLERGIES:  No Known Allergies  DISCHARGE MEDICATIONS:   Current Discharge Medication List    CONTINUE these medications which have CHANGED   Details  Levothyroxine Sodium 50 MCG CAPS Take 1 capsule (50 mcg total) by mouth daily before breakfast. Qty: 30 capsule, Refills: 6      CONTINUE these medications which have NOT CHANGED   Details  aspirin EC 81 MG tablet Take  81 mg by mouth daily.    atorvastatin (LIPITOR) 20 MG tablet Take 20 mg by mouth at bedtime.     cholecalciferol (VITAMIN D) 1000 units tablet Take 1,000 Units by mouth daily.    docusate  sodium (COLACE) 100 MG capsule Take 1 capsule (100 mg total) by mouth 2 (two) times daily. Qty: 60 capsule, Refills: 4    enalapril (VASOTEC) 5 MG tablet Take 5 mg by mouth daily.    furosemide (LASIX) 40 MG tablet Take 40 mg by mouth 2 (two) times daily as needed for edema.     Multiple Vitamin (MULTIVITAMIN) tablet Take 1 tablet by mouth daily.    pantoprazole (PROTONIX) 40 MG tablet Take 1 tablet (40 mg total) by mouth 2 (two) times daily before a meal. Qty: 60 tablet, Refills: 2    senna (SENOKOT) 8.6 MG TABS tablet Take 1 tablet (8.6 mg total) by mouth daily as needed for mild constipation. Qty: 120 each, Refills: 0    tamsulosin (FLOMAX) 0.4 MG CAPS capsule Take 0.4 mg by mouth daily.      STOP taking these medications     metoprolol succinate (TOPROL-XL) 25 MG 24 hr tablet          DISCHARGE INSTRUCTIONS:    The patient is to follow-up with primary care physician within one week after discharge  If you experience worsening of your admission symptoms, develop shortness of breath, life threatening emergency, suicidal or homicidal thoughts you must seek medical attention immediately by calling 911 or calling your MD immediately  if symptoms less severe.  You Must read complete instructions/literature along with all the possible adverse reactions/side effects for all the Medicines you take and that have been prescribed to you. Take any new Medicines after you have completely understood and accept all the possible adverse reactions/side effects.   Please note  You were cared for by a hospitalist during your hospital stay. If you have any questions about your discharge medications or the care you received while you were in the hospital after you are discharged, you can call the unit and asked to speak with the hospitalist on call if the hospitalist that took care of you is not available. Once you are discharged, your primary care physician will handle any further medical issues.  Please note that NO REFILLS for any discharge medications will be authorized once you are discharged, as it is imperative that you return to your primary care physician (or establish a relationship with a primary care physician if you do not have one) for your aftercare needs so that they can reassess your need for medications and monitor your lab values.    Today   CHIEF COMPLAINT:   Chief Complaint  Patient presents with  . Weakness    HISTORY OF PRESENT ILLNESS:  Gary Grant  is a 82 y.o. male with a known history of coronary artery disease, chronic atrial fibrillation, sick ED, COPD, hypertension, hypothyroidism, peripheral vascular disease, sick sinus syndrome, who presents to the hospital with complaints of lower extremities, giving out, lower extremity weakness, falls. He was unable to ablate in emergency room. He admits of lower back pain as well, admits of pain improvement with bending or sitting, worsening when standing. Lumbar spine MRI was unremarkable, no acute changes. Patient was seen by neurologist, recommended I69, folic acid levels, hemoglobin A1c, possibly outpatient EMG/nerve conduction testing to rule out myopathies. The patient complained of lower thoracic pain on palpation , however thoracic spine MRI was  unremarkable as well. B12 level was normal at 745, fecal occult blood was negative, hemoglobin A1c 5.8, no diabetes, vitamin D level normal at 49.9. The patient was evaluated by physical therapist and recommended rehabilitation placement.  Discussion by problem: #1. Lower extremity weakness and falls, physical therapist recommended skilled nursing facility placement, appreciate neurologist input, normal B12, folate acid level is pending, hemoglobin A1c 5.8, no diabetes , lumbar MRI  revealed old changes, thoracic MRI also. No acute changes, supportive therapy, rehabilitation placement, possibly today or tomorrow, discussed with social worker #2. Bradycardia, resolved  stopping beta blocker and advancing Synthroid dose #3. Acute on chronic renal failure, improved with IV fluid administration, likely dehydration related, patient was advised to drink plenty of fluids, continue diuretic only as needed #4. Anemia, stable, Hemoccult is ordered, negative #5. Hyperglycemia, hemoglobin A1c is 5.8, no diabetes #6. Hypothyroidism, Synthroid dose was advanced, free T4 however, was normal, likely sick euthyroid, repeat TSH and free T4 in about 6 weeks as outpatient  #7. Back pain, lumbar and thoracic spine MRI were unremarkable, old changes, supportive therapy    VITAL SIGNS:  Blood pressure 140/60, pulse 90, temperature 98 F (36.7 C), temperature source Oral, resp. rate 18, height 6' (1.829 m), weight 111.7 kg (246 lb 3.2 oz), SpO2 97 %.  I/O:   Intake/Output Summary (Last 24 hours) at 11/29/15 1130 Last data filed at 11/29/15 0740  Gross per 24 hour  Intake                0 ml  Output              500 ml  Net             -500 ml    PHYSICAL EXAMINATION:  GENERAL:  82 y.o.-year-old patient lying in the bed with no acute distress.  EYES: Pupils equal, round, reactive to light and accommodation. No scleral icterus. Extraocular muscles intact.  HEENT: Head atraumatic, normocephalic. Oropharynx and nasopharynx clear.  NECK:  Supple, no jugular venous distention. No thyroid enlargement, no tenderness.  LUNGS: Normal breath sounds bilaterally, no wheezing, rales,rhonchi or crepitation. No use of accessory muscles of respiration.  CARDIOVASCULAR: S1, S2 normal. No murmurs, rubs, or gallops.  ABDOMEN: Soft, non-tender, non-distended. Bowel sounds present. No organomegaly or mass.  EXTREMITIES: No pedal edema, cyanosis, or clubbing.  NEUROLOGIC: Cranial nerves II through XII are intact. Muscle strength 5/5 in all extremities. Sensation intact. Gait not checked.  PSYCHIATRIC: The patient is alert and oriented x 3.  SKIN: No obvious rash, lesion, or ulcer.   DATA  REVIEW:   CBC  Recent Labs Lab 11/27/15 0730  WBC 6.3  HGB 10.6*  HCT 32.8*  PLT 165    Chemistries   Recent Labs Lab 11/27/15 0730  NA 140  K 4.2  CL 107  CO2 28  GLUCOSE 100*  BUN 38*  CREATININE 1.95*  CALCIUM 8.6*  AST 20  ALT 14*  ALKPHOS 62  BILITOT 0.4    Cardiac Enzymes  Recent Labs Lab 11/26/15 1411  TROPONINI <0.03    Microbiology Results  Results for orders placed or performed during the hospital encounter of 05/05/15  Blood culture (routine x 2)     Status: None   Collection Time: 05/05/15  8:15 PM  Result Value Ref Range Status   Specimen Description BLOOD LEFT  Final   Special Requests BOTTLES DRAWN AEROBIC AND ANAEROBIC 7ML  Final   Culture NO GROWTH 5 DAYS  Final  Report Status 05/10/2015 FINAL  Final  Blood culture (routine x 2)     Status: None   Collection Time: 05/05/15  8:20 PM  Result Value Ref Range Status   Specimen Description BLOOD LEFT HAND  Final   Special Requests BOTTLES DRAWN AEROBIC AND ANAEROBIC 8ML  Final   Culture NO GROWTH 5 DAYS  Final   Report Status 05/10/2015 FINAL  Final    RADIOLOGY:  Mr Thoracic Spine Wo Contrast  Result Date: 11/28/2015 CLINICAL DATA:  Thoracic back pain. New onset of lower extremity weakness. Multiple falls. EXAM: MRI THORACIC SPINE WITHOUT CONTRAST TECHNIQUE: Multiplanar, multisequence MR imaging of the thoracic spine was performed. No intravenous contrast was administered. COMPARISON:  Lumbar MRI 11/27/2015, cervical spine CT 04/19/2013 and chest CT 03/16/2010. FINDINGS: Alignment: The thoracic alignment is normal. Sagittal localizing images of the cervical spine demonstrate stable mild spondylosis and straightening. Vertebrae: No acute or suspicious osseous findings. There is a prominent Schmorl's node involving the inferior endplate of T9. Anterior osteophytes are most prominent at this level. Cord:  The thoracic cord appears normal in signal and caliber. Paraspinal and other soft tissues:  No significant paraspinal abnormalities. Disc levels: Mild degenerative changes are present throughout the thoracic spine. There are paraspinal osteophytes, most prominent at T9-10. No disc herniation, spinal stenosis or nerve root encroachment is present. IMPRESSION: 1. No acute findings. No cord deformity, spinal stenosis or nerve root encroachment. 2. Endplate osteophytes throughout the thoracic spine with prominent Schmorl's node involving the inferior endplate of T9. Electronically Signed   By: Richardean Sale M.D.   On: 11/28/2015 15:05   Mr Lumbar Spine Wo Contrast  Result Date: 11/27/2015 CLINICAL DATA:  New onset lower extremity weakness.  Multiple falls. EXAM: MRI LUMBAR SPINE WITHOUT CONTRAST TECHNIQUE: Multiplanar, multisequence MR imaging of the lumbar spine was performed. No intravenous contrast was administered. COMPARISON:  None. FINDINGS: Segmentation: Normal. The lowest disc space is considered to be L5-S1. Alignment:  Normal Vertebrae: No acute compression fracture, facet edema or focal marrow lesion. Conus medullaris: Extends to the L1 level and appears normal. Paraspinal and other soft tissues: The visualized aorta, IVC and iliac vessels are normal. The visualized retroperitoneal organs and paraspinal soft tissues are normal. Disc levels: T11-T12: Evaluated on sagittal images only. No significant disc herniation, spinal canal stenosis or neuroforaminal narrowing. T12-L1:  Normal L1-L2: Normal disc space and facet joints. No spinal canal stenosis. No neuroforaminal stenosis. L2-L3: Small diffuse disc bulge and bilateral moderate facet hypertrophy. Prominent Schmorl's node within the inferior endplate of L2. No spinal canal stenosis. Mild left neuroforaminal stenosis. L3-L4: Mild diffuse disc bulge and moderate bilateral facet hypertrophy. No spinal canal stenosis. No neuroforaminal stenosis. L4-L5: Moderate diffuse disc bulge with mild narrowing of both lateral recesses. Moderate facet  hypertrophy. No spinal canal stenosis. Mild bilateral neuroforaminal stenosis. L5-S1: Mild disc bulge, right eccentric. No spinal canal stenosis. Moderate right and mild left neuroforaminal stenosis. IMPRESSION: 1. Multilevel degenerative disc disease and facet arthrosis with mild-to-moderate neural foraminal narrowing, greatest at right L5-S1. 2. No spinal canal stenosis. 3. Mild bilateral lateral recess narrowing at L4-L5. Correlate for symptoms of L5 radiculopathy. Electronically Signed   By: Ulyses Jarred M.D.   On: 11/27/2015 19:36    EKG:   Orders placed or performed during the hospital encounter of 11/26/15  . EKG 12-Lead  . EKG 12-Lead      Management plans discussed with the patient, family and they are in agreement.  CODE STATUS:  Code Status Orders        Start     Ordered   11/26/15 1911  Full code  Continuous     11/26/15 1910    Code Status History    Date Active Date Inactive Code Status Order ID Comments User Context   05/06/2015 12:28 AM 05/07/2015  4:50 PM Full Code [id number]  Gladstone Lighter, MD Inpatient      TOTAL TIME TAKING CARE OF THIS PATIENT: 40  minutes.    Theodoro Grist M.D on 11/29/2015 at 11:30 AM  Between 7am to 6pm - Pager - [phone]  After 6pm go to [email or web address] - password [email or web address] [facility]  Office  [phone]  CC: Primary care physician; [facility]

## 2015-11-29 NOTE — Clinical Social Work Note (Signed)
CSW received a call from [facility] case manager: Sharee Pimple at [phone] ext: [phone] and she informed me that she would have to send patient's request for auth to their medical director for review. She asked if our MD would do a peer to peer and I called the attending and she stated she would do a peer to peer if needed. CSW called Sharee Pimple and informed her of this and gave her the MD's contact information. CSW will await determination. [name] MSW,LCSW [phone]

## 2015-11-29 NOTE — Clinical Social Work Note (Signed)
CSW has received auth from [place]: [id number]. [place] aware. Nurse to call report. Family to transport. [name] MSW,LCSW [phone]

## 2015-12-19 ENCOUNTER — Emergency Department: Payer: Medicare HMO

## 2015-12-19 ENCOUNTER — Inpatient Hospital Stay
Admission: EM | Admit: 2015-12-19 | Discharge: 2015-12-21 | DRG: 282 | Disposition: A | Discharge status: Home Health | Payer: Medicare HMO | Attending: Internal Medicine | Admitting: Internal Medicine

## 2015-12-19 DIAGNOSIS — R0902 Hypoxemia: Secondary | ICD-10-CM | POA: Diagnosis present

## 2015-12-19 DIAGNOSIS — E039 Hypothyroidism, unspecified: Secondary | ICD-10-CM | POA: Diagnosis present

## 2015-12-19 DIAGNOSIS — K219 Gastro-esophageal reflux disease without esophagitis: Secondary | ICD-10-CM | POA: Diagnosis present

## 2015-12-19 DIAGNOSIS — J449 Chronic obstructive pulmonary disease, unspecified: Secondary | ICD-10-CM | POA: Diagnosis present

## 2015-12-19 DIAGNOSIS — Z951 Presence of aortocoronary bypass graft: Secondary | ICD-10-CM

## 2015-12-19 DIAGNOSIS — I482 Chronic atrial fibrillation: Secondary | ICD-10-CM | POA: Diagnosis present

## 2015-12-19 DIAGNOSIS — I129 Hypertensive chronic kidney disease with stage 1 through stage 4 chronic kidney disease, or unspecified chronic kidney disease: Secondary | ICD-10-CM | POA: Diagnosis present

## 2015-12-19 DIAGNOSIS — Z66 Do not resuscitate: Secondary | ICD-10-CM | POA: Diagnosis present

## 2015-12-19 DIAGNOSIS — I509 Heart failure, unspecified: Secondary | ICD-10-CM

## 2015-12-19 DIAGNOSIS — Z7982 Long term (current) use of aspirin: Secondary | ICD-10-CM

## 2015-12-19 DIAGNOSIS — N183 Chronic kidney disease, stage 3 (moderate): Secondary | ICD-10-CM | POA: Diagnosis present

## 2015-12-19 DIAGNOSIS — Z87891 Personal history of nicotine dependence: Secondary | ICD-10-CM

## 2015-12-19 DIAGNOSIS — Z79899 Other long term (current) drug therapy: Secondary | ICD-10-CM

## 2015-12-19 DIAGNOSIS — Z8546 Personal history of malignant neoplasm of prostate: Secondary | ICD-10-CM

## 2015-12-19 DIAGNOSIS — I2510 Atherosclerotic heart disease of native coronary artery without angina pectoris: Secondary | ICD-10-CM | POA: Diagnosis present

## 2015-12-19 DIAGNOSIS — I214 Non-ST elevation (NSTEMI) myocardial infarction: Principal | ICD-10-CM | POA: Diagnosis present

## 2015-12-19 DIAGNOSIS — R0689 Other abnormalities of breathing: Secondary | ICD-10-CM | POA: Diagnosis present

## 2015-12-19 DIAGNOSIS — I739 Peripheral vascular disease, unspecified: Secondary | ICD-10-CM | POA: Diagnosis present

## 2015-12-19 DIAGNOSIS — I4891 Unspecified atrial fibrillation: Secondary | ICD-10-CM | POA: Diagnosis present

## 2015-12-19 DIAGNOSIS — Z9181 History of falling: Secondary | ICD-10-CM

## 2015-12-19 DIAGNOSIS — M6281 Muscle weakness (generalized): Secondary | ICD-10-CM

## 2015-12-19 DIAGNOSIS — R000 Tachycardia, unspecified: Secondary | ICD-10-CM | POA: Diagnosis not present

## 2015-12-19 LAB — TROPONIN I
TROPONIN I: 0.12 ng/mL — AB (ref <0.03)
Troponin I: 1.18 ng/mL (ref <0.03)
Troponin I: 1 ng/mL (ref <0.03)

## 2015-12-19 LAB — CBC WITH DIFFERENTIAL/PLATELET
Basophils Absolute: 0 10*3/uL (ref 0–0.1)
Basophils Relative: 1 %
EOS ABS: 0.2 10*3/uL (ref 0–0.7)
EOS PCT: 3 %
HCT: 29.5 % — ABNORMAL LOW (ref 40.0–52.0)
Hemoglobin: 9.7 g/dL — ABNORMAL LOW (ref 13.0–18.0)
LYMPHS ABS: 0.7 10*3/uL — AB (ref 1.0–3.6)
Lymphocytes Relative: 11 %
MCH: 30.3 pg (ref 26.0–34.0)
MCHC: 32.9 g/dL (ref 32.0–36.0)
MCV: 92 fL (ref 80.0–100.0)
Monocytes Absolute: 0.6 10*3/uL (ref 0.2–1.0)
Monocytes Relative: 10 %
Neutro Abs: 4.7 10*3/uL (ref 1.4–6.5)
Neutrophils Relative %: 75 %
PLATELETS: 207 10*3/uL (ref 150–440)
RBC: 3.2 MIL/uL — AB (ref 4.40–5.90)
RDW: 17.4 % — ABNORMAL HIGH (ref 11.5–14.5)
WBC: 6.3 10*3/uL (ref 3.8–10.6)

## 2015-12-19 LAB — BASIC METABOLIC PANEL
Anion gap: 9 (ref 5–15)
BUN: 38 mg/dL — AB (ref 6–20)
CALCIUM: 8.5 mg/dL — AB (ref 8.9–10.3)
CO2: 27 mmol/L (ref 22–32)
CREATININE: 2.09 mg/dL — AB (ref 0.61–1.24)
Chloride: 103 mmol/L (ref 101–111)
GFR calc Af Amer: 31 mL/min — ABNORMAL LOW (ref >60)
GFR, EST NON AFRICAN AMERICAN: 26 mL/min — AB (ref >60)
Glucose, Bld: 91 mg/dL (ref 65–99)
POTASSIUM: 4.1 mmol/L (ref 3.5–5.1)
SODIUM: 139 mmol/L (ref 135–145)

## 2015-12-19 LAB — BRAIN NATRIURETIC PEPTIDE: B Natriuretic Peptide: 122 pg/mL — ABNORMAL HIGH (ref 0.0–100.0)

## 2015-12-19 LAB — TSH: TSH: 12.048 u[IU]/mL — ABNORMAL HIGH (ref 0.350–4.500)

## 2015-12-19 LAB — T4, FREE: FREE T4: 0.88 ng/dL (ref 0.61–1.12)

## 2015-12-19 MED ORDER — ASPIRIN 81 MG PO CHEW
324.0000 mg | CHEWABLE_TABLET | Freq: Once | ORAL | Status: AC
  Administered 2015-12-19: 324 mg via ORAL
  Filled 2015-12-19: qty 4

## 2015-12-19 MED ORDER — PANTOPRAZOLE SODIUM 40 MG PO TBEC
40.0000 mg | DELAYED_RELEASE_TABLET | Freq: Every day | ORAL | Status: DC
  Administered 2015-12-19 – 2015-12-21 (×3): 40 mg via ORAL
  Filled 2015-12-19 (×3): qty 1

## 2015-12-19 MED ORDER — IPRATROPIUM-ALBUTEROL 0.5-2.5 (3) MG/3ML IN SOLN
3.0000 mL | RESPIRATORY_TRACT | Status: DC
  Administered 2015-12-19: 3 mL via RESPIRATORY_TRACT
  Filled 2015-12-19: qty 3

## 2015-12-19 MED ORDER — ASPIRIN EC 81 MG PO TBEC
81.0000 mg | DELAYED_RELEASE_TABLET | Freq: Every day | ORAL | Status: DC
  Administered 2015-12-20 – 2015-12-21 (×2): 81 mg via ORAL
  Filled 2015-12-19 (×2): qty 1

## 2015-12-19 MED ORDER — ONDANSETRON HCL 4 MG/2ML IJ SOLN: 4.0000 mg | Freq: Four times a day (QID) | INTRAMUSCULAR | Status: DC | PRN

## 2015-12-19 MED ORDER — IPRATROPIUM-ALBUTEROL 0.5-2.5 (3) MG/3ML IN SOLN
3.0000 mL | Freq: Four times a day (QID) | RESPIRATORY_TRACT | Status: DC
  Administered 2015-12-19 – 2015-12-20 (×5): 3 mL via RESPIRATORY_TRACT
  Filled 2015-12-19 (×5): qty 3

## 2015-12-19 MED ORDER — ONDANSETRON HCL 4 MG PO TABS: 4.0000 mg | ORAL_TABLET | Freq: Four times a day (QID) | ORAL | Status: DC | PRN

## 2015-12-19 MED ORDER — ACETAMINOPHEN 650 MG RE SUPP: 650.0000 mg | Freq: Four times a day (QID) | RECTAL | Status: DC | PRN

## 2015-12-19 MED ORDER — ENOXAPARIN SODIUM 40 MG/0.4ML ~~LOC~~ SOLN: 40.0000 mg | SUBCUTANEOUS | Status: DC

## 2015-12-19 MED ORDER — ENALAPRIL MALEATE 5 MG PO TABS
5.0000 mg | ORAL_TABLET | Freq: Every day | ORAL | Status: DC
  Administered 2015-12-19 – 2015-12-21 (×3): 5 mg via ORAL
  Filled 2015-12-19 (×3): qty 1

## 2015-12-19 MED ORDER — ATORVASTATIN CALCIUM 20 MG PO TABS
20.0000 mg | ORAL_TABLET | Freq: Every day | ORAL | Status: DC
  Administered 2015-12-19 – 2015-12-20 (×2): 20 mg via ORAL
  Filled 2015-12-19 (×2): qty 1

## 2015-12-19 MED ORDER — IPRATROPIUM-ALBUTEROL 0.5-2.5 (3) MG/3ML IN SOLN
3.0000 mL | RESPIRATORY_TRACT | Status: DC | PRN
Start: 2015-12-19 — End: 2015-12-21

## 2015-12-19 MED ORDER — FUROSEMIDE 10 MG/ML IJ SOLN
20.0000 mg | Freq: Once | INTRAMUSCULAR | Status: AC
  Administered 2015-12-19: 20 mg via INTRAVENOUS
  Filled 2015-12-19: qty 4

## 2015-12-19 MED ORDER — ADULT MULTIVITAMIN W/MINERALS CH
1.0000 | ORAL_TABLET | Freq: Every day | ORAL | Status: DC
  Administered 2015-12-19 – 2015-12-21 (×3): 1 via ORAL
  Filled 2015-12-19 (×3): qty 1

## 2015-12-19 MED ORDER — SODIUM CHLORIDE 0.9% FLUSH
3.0000 mL | Freq: Two times a day (BID) | INTRAVENOUS | Status: DC
  Administered 2015-12-19 – 2015-12-20 (×4): 3 mL via INTRAVENOUS

## 2015-12-19 MED ORDER — SODIUM CHLORIDE 0.9 % IV SOLN: INTRAVENOUS | Status: DC

## 2015-12-19 MED ORDER — FUROSEMIDE 40 MG PO TABS
40.0000 mg | ORAL_TABLET | Freq: Every day | ORAL | Status: DC
  Administered 2015-12-20 – 2015-12-21 (×2): 40 mg via ORAL
  Filled 2015-12-19 (×2): qty 1

## 2015-12-19 MED ORDER — METOPROLOL SUCCINATE ER 25 MG PO TB24
12.5000 mg | ORAL_TABLET | Freq: Every day | ORAL | Status: DC
  Administered 2015-12-19 – 2015-12-20 (×2): 12.5 mg via ORAL
  Filled 2015-12-19 (×2): qty 1

## 2015-12-19 MED ORDER — OXYCODONE HCL 5 MG PO TABS: 5.0000 mg | ORAL_TABLET | ORAL | Status: DC | PRN

## 2015-12-19 MED ORDER — VITAMIN D 1000 UNITS PO TABS
2000.0000 [IU] | ORAL_TABLET | Freq: Every day | ORAL | Status: DC
  Administered 2015-12-19 – 2015-12-21 (×3): 2000 [IU] via ORAL
  Filled 2015-12-19 (×3): qty 2
  Filled 2015-12-19: qty 1

## 2015-12-19 MED ORDER — TAMSULOSIN HCL 0.4 MG PO CAPS
0.4000 mg | ORAL_CAPSULE | Freq: Every day | ORAL | Status: DC
  Administered 2015-12-19 – 2015-12-21 (×3): 0.4 mg via ORAL
  Filled 2015-12-19 (×3): qty 1

## 2015-12-19 MED ORDER — ACETAMINOPHEN 325 MG PO TABS: 650.0000 mg | ORAL_TABLET | Freq: Four times a day (QID) | ORAL | Status: DC | PRN

## 2015-12-19 MED ORDER — ENOXAPARIN SODIUM 120 MG/0.8ML ~~LOC~~ SOLN
115.0000 mg | Freq: Two times a day (BID) | SUBCUTANEOUS | Status: DC
  Administered 2015-12-19 – 2015-12-21 (×4): 115 mg via SUBCUTANEOUS
  Filled 2015-12-19 (×4): qty 0.8

## 2015-12-19 MED ORDER — ALLOPURINOL 100 MG PO TABS
100.0000 mg | ORAL_TABLET | Freq: Every day | ORAL | Status: DC
  Administered 2015-12-19 – 2015-12-21 (×3): 100 mg via ORAL
  Filled 2015-12-19 (×3): qty 1

## 2015-12-19 MED ORDER — LEVOTHYROXINE SODIUM 75 MCG PO TABS
75.0000 ug | ORAL_TABLET | Freq: Every day | ORAL | Status: DC
Start: 2015-12-19 — End: 2015-12-21
  Administered 2015-12-20 – 2015-12-21 (×2): 75 ug via ORAL
  Filled 2015-12-19 (×2): qty 1

## 2015-12-19 NOTE — ED Provider Notes (Addendum)
[facility] Emergency Department Provider Note  ____________________________________________   First MD Initiated Contact with Patient 12/19/15 [phone]     (approximate)  I have reviewed the triage vital signs and the nursing notes.   HISTORY  Chief Complaint Tachycardia   HPI Gary Grant is a 82 y.o. male With a history of coronary artery disease status post bypass as well as A. Fib and CK D who is presenting to the emergency department today with shortness of breath. He says that overnight he became short of breath suddenly and felt like he was going to pass out when he was walking to go to the bathroom. He also notes that he has had difficulty urinating lately and is also being treated for enlarged prostate with Flomax.  He denies any chest pain. Per EMS, he was originally in atrial fibrillation with a heart rate from 150-220. However, with 100 cc of saline  His heart rate resolved from 95-110 and his blood pressure improved to 564 systolic .   Er EMS, the patient was also 68% on room air upon arrival of fire and rescue. He has been 100% on nasal cannula oxygen.The patient does not wear any supplemental oxygen at home.   Past Medical History:  Diagnosis Date  . CAD (coronary artery disease)   . Chronic a-fib ([place])   . CKD (chronic kidney disease)   . COPD (chronic obstructive pulmonary disease) ([place])   . GERD (gastroesophageal reflux disease)   . HTN (hypertension)   . Hypothyroidism   . PAD (peripheral artery disease) ([place])   . Prostate cancer ([place])   . SSS (sick sinus syndrome) ([place])   . Venous insufficiency     Patient Active Problem List   Diagnosis Date Noted  . Leg weakness, bilateral 11/29/2015  . Falls 11/29/2015  . Degenerative disc disease, lumbar 11/29/2015  . Back pain 11/29/2015  . Bradycardia, drug induced 11/29/2015  . Dehydration 11/29/2015  . Anemia 11/29/2015  . Hyperglycemia 11/29/2015  . Hypothyroidism 11/29/2015  .  Weakness 11/26/2015  . Abdominal pain, epigastric 05/07/2015  . Gout of hand 05/07/2015  . Nephrolithiasis 05/07/2015  . Cellulitis of hand 05/07/2015  . Constipation 05/07/2015  . Acute on chronic renal failure ([place]) 05/05/2015    Past Surgical History:  Procedure Laterality Date  . CARDIAC SURGERY     bypass  . EYE SURGERY    . FRACTURE SURGERY      Prior to Admission medications   Medication Sig Start Date End Date Taking? Authorizing Provider  acetaminophen (TYLENOL) 325 MG tablet Take 325-650 mg by mouth every 6 (six) hours as needed for moderate pain or fever.   Yes Historical Provider, MD  allopurinol (ZYLOPRIM) 100 MG tablet Take 100 mg by mouth daily.   Yes Historical Provider, MD  aspirin EC 81 MG tablet Take 81 mg by mouth daily.   Yes Historical Provider, MD  atorvastatin (LIPITOR) 20 MG tablet Take 20 mg by mouth at bedtime.    Yes Historical Provider, MD  Cholecalciferol 2000 units TABS Take 2,000 Units by mouth daily.   Yes Historical Provider, MD  enalapril (VASOTEC) 5 MG tablet Take 5 mg by mouth daily.   Yes Historical Provider, MD  furosemide (LASIX) 40 MG tablet Take 40 mg by mouth daily.    Yes Historical Provider, MD  levothyroxine (SYNTHROID, LEVOTHROID) 75 MCG tablet Take 75 mcg by mouth daily before breakfast.   Yes Historical Provider, MD  metoprolol succinate (TOPROL-XL) 25  MG 24 hr tablet Take 12.5 mg by mouth at bedtime.   Yes Historical Provider, MD  Multiple Vitamins-Minerals (MULTIVITAMIN ADULTS 50+) TABS Take 1 tablet by mouth daily.   Yes Historical Provider, MD  pantoprazole (PROTONIX) 40 MG tablet Take 1 tablet (40 mg total) by mouth 2 (two) times daily before a meal. Patient taking differently: Take 40 mg by mouth daily as needed. For acid reflux 05/07/15  Yes Theodoro Grist, MD  tamsulosin (FLOMAX) 0.4 MG CAPS capsule Take 0.4 mg by mouth daily.   Yes Historical Provider, MD  Levothyroxine Sodium 50 MCG CAPS Take 1 capsule (50 mcg total) by mouth  daily before breakfast. Patient not taking: Reported on 12/19/2015 11/29/15   Theodoro Grist, MD    Allergies Review of patient's allergies indicates no known allergies.  Family History  Problem Relation Age of Onset  . Osteoporosis Mother   . Heart Problems Father   . Stroke Maternal Grandfather   . Stroke Paternal Grandfather   . Stroke Paternal Grandmother     Social History Social History  Substance Use Topics  . Smoking status: Former Research scientist (life sciences)  . Smokeless tobacco: Never Used  . Alcohol use No    Review of Systems Constitutional: No fever/chills Eyes: No visual changes. ENT: No sore throat. Cardiovascular: Denies chest pain. Respiratory:as above Gastrointestinal: No abdominal pain.  No nausea, no vomiting.  No diarrhea.  No constipation. Genitourinary: Negative for dysuria. Musculoskeletal: Negative for back pain. Skin: Negative for rash. Neurological: Negative for headaches, focal weakness or numbness.  10-point ROS otherwise negative.  ____________________________________________   PHYSICAL EXAM:  VITAL SIGNS: ED Triage Vitals  Enc Vitals Group     BP 12/19/15 0837 130/79     Pulse Rate 12/19/15 0837 97     Resp 12/19/15 0837 16     Temp 12/19/15 0837 98.1 F (36.7 C)     Temp Source 12/19/15 0837 Oral     SpO2 12/19/15 0837 100 %     Weight 12/19/15 0834 265 lb (120.2 kg)     Height 12/19/15 0834 6' (1.829 m)     Head Circumference --      Peak Flow --      Pain Score --      Pain Loc --      Pain Edu? --      Excl. in [place]? --     Constitutional: Alert and oriented. Well appearing and in no acute distress. Eyes: Conjunctivae are normal. PERRL. EOMI. Head: Atraumatic. Nose: No congestion/rhinnorhea. Mouth/Throat: Mucous membranes are moist.   Neck: No stridor.   Cardiovascular: Normal rate, regular rhythm. Grossly normal heart sounds.   Respiratory: Normal respiratory effort.  No retractions. Rales to the mid fields  bilaterally. Gastrointestinal: Soft and nontender. No distention. sOft and easily reducible umbilical hernia. Musculoskeletal: bilateral moderate lower extremity edema to the mid calves. Neurologic:  Normal speech and language. No gross focal neurologic deficits are appreciated.  Skin:  Skin is warm, dry and intact. No rash noted. Psychiatric: Mood and affect are normal. Speech and behavior are normal.  ____________________________________________   LABS (all labs ordered are listed, but only abnormal results are displayed)  Labs Reviewed  CBC WITH DIFFERENTIAL/PLATELET - Abnormal; Notable for the following:       Result Value   RBC 3.20 (*)    Hemoglobin 9.7 (*)    HCT 29.5 (*)    RDW 17.4 (*)    Lymphs Abs 0.7 (*)    All other  components within normal limits  BASIC METABOLIC PANEL - Abnormal; Notable for the following:    BUN 38 (*)    Creatinine, Ser 2.09 (*)    Calcium 8.5 (*)    GFR calc non Af Amer 26 (*)    GFR calc Af Amer 31 (*)    All other components within normal limits  TROPONIN I - Abnormal; Notable for the following:    Troponin I 0.12 (*)    All other components within normal limits  BRAIN NATRIURETIC PEPTIDE - Abnormal; Notable for the following:    B Natriuretic Peptide 122.0 (*)    All other components within normal limits   ____________________________________________  EKG  prehospital EKG with rapid atrial fibrillation with heart rate of 129. No STEMI.  ED ECG REPORT I, Doran Stabler, the attending physician, personally viewed and interpreted this ECG.   Date: 12/19/2015  EKG Time: 0835  Rate: 97  Rhythm: appears as a sinus rhythm withPACs.  Axis: normal  Intervals:none  ST&T Change: no ST segment elevation or depression. No abnormal T-wave inversion.  ____________________________________________  RADIOLOGY  DG Chest 1 View (Accession [id number]) (Order [id number])  Imaging  Date: 12/19/2015 Department: [facility] EMERGENCY DEPARTMENT Released By/Authorizing: Orbie Pyo, MD (auto-released)  Exam Information   Status Exam Begun  Exam Ended   Final [99] 12/19/2015 8:48 AM 12/19/2015 9:01 AM  PACS Images   Show images for DG Chest 1 View  Study Result   CLINICAL DATA:  New onset of shortness of breath starting last night  EXAM: CHEST 1 VIEW  COMPARISON:  11/26/2015  FINDINGS: Cardiomediastinal silhouette is stable. Status post CABG. No infiltrate or pleural effusion. Mild elevation of the left hemidiaphragm. Mild basilar atelectasis.  IMPRESSION: Status post CABG. Mild elevation of the left hemidiaphragm with left basilar atelectasis. No pulmonary edema.   Electronically Signed   By: Lahoma Crocker M.D.   On: 12/19/2015 09:20     ____________________________________________   PROCEDURES  Procedure(s) performed:  Procedures  Critical Care performed:   ____________________________________________   INITIAL IMPRESSION / ASSESSMENT AND PLAN / ED COURSE  Pertinent labs & imaging results that were available during my care of the patient were reviewed by me and considered in my medical decision making (see chart for details).  ----------------------------------------- 10:25 AM on 12/19/2015 -----------------------------------------  Patient resting of the lid this time. Taken off of his nasal cannula oxygen and at 96-99% on room air. Remains at a normal rate in the 90s. Suspect the patient has demand ischemia causing his elevated troponin. We'll give Lasix secondary to shortness of breath and being in nature fibrillation. He has a slight elevated BNP and he likely has a mild degree of pulmonary edema. We'll admit to the hospital for continued observation and treatment. Discussed this plan with the patient as well as family are understanding of wine to comply. Signed out to Dr. Lavetta Nielsen.  Clinical Course      ____________________________________________   FINAL CLINICAL IMPRESSION(S) / ED DIAGNOSES  Shortness of breath. Hypoxia. CHF.    NEW MEDICATIONS STARTED DURING THIS VISIT:  New Prescriptions   No medications on file     Note:  This document was prepared using Dragon voice recognition software and may include unintentional dictation errors.    Orbie Pyo, MD 12/19/15 1026  Patient symptom free at this time. We'll hold on heparin and feel the elevated troponin was likely related to the patient's rapid atrial fibrillation which  is now resolved.    Orbie Pyo, MD 12/19/15 1026

## 2015-12-19 NOTE — Care Management Important Message (Deleted)
Important Message  Patient Details  Name: Gary Grant MRN: [id number] Date of Birth: 17-Mar-1923   Medicare Important Message Given:  Yes    Kyndell Zeiser A, RN 12/19/2015, 3:02 PM

## 2015-12-19 NOTE — H&P (Signed)
[place] [facility] [place] NAME: Gary Grant    MR#:  [id number]  DATE OF BIRTH:  [birth year]   DATE OF ADMISSION:  12/19/2015  PRIMARY CARE PHYSICIAN: [place]   REQUESTING/REFERRING PHYSICIAN: Event organiser  CHIEF COMPLAINT:   Shortness of breath  HISTORY OF PRESENT ILLNESS:  Gary Grant  is a 82 y.o. male with a known history of Chronic atrial fibrillation not on anticoagulation given fall risk, hypothyroidism who is presenting with shortness of breath. He describes acute onset shortness of breath starting around midnight the day of admission progressively worsened on EMS arrival he is noted to be tachycardic heart rates. Ranging between 150 and 200 in atrial fibrillation he was saturating in the mid 60s on room air at that time. Brought to the Hospital further workup and evaluation. Had spontaneous control of heart rate with marked improvement of respiratory status he is now without any further complaints.  Just released from [facility] about 2 days ago, using a walker for ambulation currently  PAST MEDICAL HISTORY:   Past Medical History:  Diagnosis Date  . CAD (coronary artery disease)   . Chronic a-fib ([place])   . CKD (chronic kidney disease)   . COPD (chronic obstructive pulmonary disease) ([place])   . GERD (gastroesophageal reflux disease)   . HTN (hypertension)   . Hypothyroidism   . PAD (peripheral artery disease) ([place])   . Prostate cancer ([place])   . SSS (sick sinus syndrome) ([place])   . Venous insufficiency     PAST SURGICAL HISTORY:   Past Surgical History:  Procedure Laterality Date  . CARDIAC SURGERY     bypass  . EYE SURGERY    . FRACTURE SURGERY      SOCIAL HISTORY:   Social History  Substance Use Topics  . Smoking status: Former Research scientist (life sciences)  . Smokeless tobacco: Never Used  . Alcohol use No    FAMILY HISTORY:   Family History  Problem Relation Age of Onset  . Osteoporosis Mother   .  Heart Problems Father   . Stroke Maternal Grandfather   . Stroke Paternal Grandfather   . Stroke Paternal Grandmother     DRUG ALLERGIES:  No Known Allergies  REVIEW OF SYSTEMS:  REVIEW OF SYSTEMS:  CONSTITUTIONAL: Denies fevers, chills, fatigue, weakness.  EYES: Denies blurred vision, double vision, or eye pain.  EARS, NOSE, THROAT: Denies tinnitus, ear pain, hearing loss.  RESPIRATORY: denies cough,Positive shortness of breath, denies wheezing  CARDIOVASCULAR: Denies chest pain, positive palpitations, edema.  GASTROINTESTINAL: Denies nausea, vomiting, diarrhea, abdominal pain.  GENITOURINARY: Denies dysuria, hematuria.  ENDOCRINE: Denies nocturia or thyroid problems. HEMATOLOGIC AND LYMPHATIC: Denies easy bruising or bleeding.  SKIN: Denies rash or lesions.  MUSCULOSKELETAL: Denies pain in neck, back, shoulder, knees, hips, or further arthritic symptoms.  NEUROLOGIC: Denies paralysis, paresthesias.  PSYCHIATRIC: Denies anxiety or depressive symptoms. Otherwise full review of systems performed by me is negative.   MEDICATIONS AT HOME:   Prior to Admission medications   Medication Sig Start Date End Date Taking? Authorizing Provider  acetaminophen (TYLENOL) 325 MG tablet Take 325-650 mg by mouth every 6 (six) hours as needed for moderate pain or fever.   Yes Historical Provider, MD  allopurinol (ZYLOPRIM) 100 MG tablet Take 100 mg by mouth daily.   Yes Historical Provider, MD  aspirin EC 81 MG tablet Take 81 mg by mouth daily.   Yes Historical Provider, MD  atorvastatin (LIPITOR) 20 MG tablet  Take 20 mg by mouth at bedtime.    Yes Historical Provider, MD  Cholecalciferol 2000 units TABS Take 2,000 Units by mouth daily.   Yes Historical Provider, MD  enalapril (VASOTEC) 5 MG tablet Take 5 mg by mouth daily.   Yes Historical Provider, MD  furosemide (LASIX) 40 MG tablet Take 40 mg by mouth daily.    Yes Historical Provider, MD  levothyroxine (SYNTHROID, LEVOTHROID) 75 MCG tablet  Take 75 mcg by mouth daily before breakfast.   Yes Historical Provider, MD  metoprolol succinate (TOPROL-XL) 25 MG 24 hr tablet Take 12.5 mg by mouth at bedtime.   Yes Historical Provider, MD  Multiple Vitamins-Minerals (MULTIVITAMIN ADULTS 50+) TABS Take 1 tablet by mouth daily.   Yes Historical Provider, MD  pantoprazole (PROTONIX) 40 MG tablet Take 1 tablet (40 mg total) by mouth 2 (two) times daily before a meal. Patient taking differently: Take 40 mg by mouth daily as needed. For acid reflux 05/07/15  Yes Theodoro Grist, MD  tamsulosin (FLOMAX) 0.4 MG CAPS capsule Take 0.4 mg by mouth daily.   Yes Historical Provider, MD  Levothyroxine Sodium 50 MCG CAPS Take 1 capsule (50 mcg total) by mouth daily before breakfast. Patient not taking: Reported on 12/19/2015 11/29/15   Theodoro Grist, MD      VITAL SIGNS:  Blood pressure (!) 124/59, pulse 92, temperature 98.1 F (36.7 C), temperature source Oral, resp. rate 13, height 6' (1.829 m), weight 120.2 kg (265 lb), SpO2 99 %.  PHYSICAL EXAMINATION:  VITAL SIGNS: Vitals:   12/19/15 0837 12/19/15 0930  BP: 130/79 (!) 124/59  Pulse: 97 92  Resp: 16 13  Temp: 98.1 F (36.7 C)    GENERAL:82 y.o.male currently in no acute distress.  HEAD: Normocephalic, atraumatic.  EYES: Pupils equal, round, reactive to light. Extraocular muscles intact. No scleral icterus.  MOUTH: Moist mucosal membrane. Dentition intact. No abscess noted.  EAR, NOSE, THROAT: Clear without exudates. No external lesions.  NECK: Supple. No thyromegaly. No nodules. No JVD.  PULMONARY: Clear to ascultation, without wheeze rails or rhonci. No use of accessory muscles, Good respiratory effort. good air entry bilaterally CHEST: Nontender to palpation.  CARDIOVASCULAR: S1 and S2. Irregular rate and rhythm. No murmurs, rubs, or gallops. 1+ edema. Pedal pulses 2+ bilaterally.  GASTROINTESTINAL: Soft, nontender, nondistended. No masses. Positive bowel sounds. No hepatosplenomegaly.    MUSCULOSKELETAL: No swelling, clubbing, or edema. Range of motion full in all extremities.  NEUROLOGIC: Cranial nerves II through XII are intact. No gross focal neurological deficits. Sensation intact. Reflexes intact.  SKIN: No ulceration, lesions, rashes, or cyanosis. Skin warm and dry. Turgor intact.  PSYCHIATRIC: Mood, affect within normal limits. The patient is awake, alert and oriented x 3. Insight, judgment intact.    LABORATORY PANEL:   CBC  Recent Labs Lab 12/19/15 0837  WBC 6.3  HGB 9.7*  HCT 29.5*  PLT 207   ------------------------------------------------------------------------------------------------------------------  Chemistries   Recent Labs Lab 12/19/15 0837  NA 139  K 4.1  CL 103  CO2 27  GLUCOSE 91  BUN 38*  CREATININE 2.09*  CALCIUM 8.5*   ------------------------------------------------------------------------------------------------------------------  Cardiac Enzymes  Recent Labs Lab 12/19/15 0837  TROPONINI 0.12*   ------------------------------------------------------------------------------------------------------------------  RADIOLOGY:  Dg Chest 1 View  Result Date: 12/19/2015 CLINICAL DATA:  New onset of shortness of breath starting last night EXAM: CHEST 1 VIEW COMPARISON:  11/26/2015 FINDINGS: Cardiomediastinal silhouette is stable. Status post CABG. No infiltrate or pleural effusion. Mild elevation of the left hemidiaphragm.  Mild basilar atelectasis. IMPRESSION: Status post CABG. Mild elevation of the left hemidiaphragm with left basilar atelectasis. No pulmonary edema. Electronically Signed   By: Lahoma Crocker M.D.   On: 12/19/2015 09:20    EKG:   Orders placed or performed during the hospital encounter of 12/19/15  . EKG 12-Lead  . EKG 12-Lead  . EKG 12-Lead  . EKG 12-Lead    IMPRESSION AND PLAN:   82 year old African-American gentleman history of atrial fibrillation not on anticoagulants given fall risk and  hypothyroidism who is presenting with shortness of breath  1. Atrial fibrillation rapid ventricular response: Heart rate has spontaneously improved will consult cardiology, trend cardiac enzymes 2. Hypoxic respiratory insufficiency: In relation to rapid ventricular response, has improved as heart rate has improved 3. Elevated cardiac enzyme: Place on telemetry trend cardiac enzymes likely demand related 4. Hypothyroidism unspecified: Had recent increased dose to 75 g of Synthroid will check TSH/free T4 5. GERD without esophagitis PPI therapy 6. Hypertension essential: Enalapril, Toprol   All the records are reviewed and case discussed with ED provider. Management plans discussed with the patient, family and they are in agreement.  CODE STATUS: Full  TOTAL TIME TAKING CARE OF THIS PATIENT: 33 minutes.    Theodore Rahrig,  Karenann Cai.D on 12/19/2015 at 11:22 AM  Between 7am to 6pm - Pager - [phone]  After 6pm: House Pager: - [facility]  Office  [phone]  CC: Primary care physician; [facility]

## 2015-12-19 NOTE — Progress Notes (Signed)
Critical troponin called from 1.18 down to 1.00,  MD aware.

## 2015-12-19 NOTE — Progress Notes (Signed)
Spoke with dr. Lavetta Nielsen to make aware second troponin has elevated to 1.18. Per md will change order of lovenox. Cardio consult already ordered. Will continue to monitor

## 2015-12-19 NOTE — Consult Note (Signed)
[facility] Cardiology Consultation Note  Patient ID: Gary Grant, MRN: [id number], DOB/AGE: 09/24/1923 82 y.o. Admit date: 12/19/2015   Date of Consult: 12/19/2015 Primary Physician: [facility] Primary Cardiologist: Nehemiah Massed  Chief Complaint:  Chief Complaint  Patient presents with  . Tachycardia   Reason for Consult: atrial fibrillation with rapid ventricular rate  HPI: 82 y.o. male with known chronic nonvalvular atrial fibrillation on appropriate medication management today in the past with good heart rate control and no anticoagulation due to significant fall risk and advanced age. The patient has had a recent concerns of hypothyroidism with an elevated TSH and chronic kidney disease stage III with a GFR of 31. Also the patient has had new onset chest pressure shortness of breath weakness and fatigue for which occurred in a short period of time when he was in his house. He had chest pressure and felt like he was going to have a heart attack. When seen in the emergency room he had atrial fibrillation with rapid ventricular rate which was treated with appropriate medication management and currently more stable with better heart rate control on metoprolol. Since then the patient has had continued atrial fibrillation with nonspecific ST changes by telemetry. He did have an elevated troponin of 1.1 consistent with non-ST elevation myocardial infarction and possible coronary artery disease exacerbation. The patient also has good lipid management in the past with high intensity cholesterol therapy with Lipitor as well as hypertension management with lisinopril. Currently the patient does have some improvements of symptoms and stable at this time  Past Medical History:  Diagnosis Date  . CAD (coronary artery disease)   . Chronic a-fib ([place])   . CKD (chronic kidney disease)   . COPD (chronic obstructive pulmonary disease) ([place])   . GERD (gastroesophageal reflux disease)    . HTN (hypertension)   . Hypothyroidism   . PAD (peripheral artery disease) ([place])   . Prostate cancer ([place])   . SSS (sick sinus syndrome) ([place])   . Venous insufficiency       Surgical History:  Past Surgical History:  Procedure Laterality Date  . CARDIAC SURGERY     bypass  . EYE SURGERY    . FRACTURE SURGERY       Home Meds: Prior to Admission medications   Medication Sig Start Date End Date Taking? Authorizing Provider  acetaminophen (TYLENOL) 325 MG tablet Take 325-650 mg by mouth every 6 (six) hours as needed for moderate pain or fever.   Yes Historical Provider, MD  allopurinol (ZYLOPRIM) 100 MG tablet Take 100 mg by mouth daily.   Yes Historical Provider, MD  aspirin EC 81 MG tablet Take 81 mg by mouth daily.   Yes Historical Provider, MD  atorvastatin (LIPITOR) 20 MG tablet Take 20 mg by mouth at bedtime.    Yes Historical Provider, MD  Cholecalciferol 2000 units TABS Take 2,000 Units by mouth daily.   Yes Historical Provider, MD  enalapril (VASOTEC) 5 MG tablet Take 5 mg by mouth daily.   Yes Historical Provider, MD  furosemide (LASIX) 40 MG tablet Take 40 mg by mouth daily.    Yes Historical Provider, MD  levothyroxine (SYNTHROID, LEVOTHROID) 75 MCG tablet Take 75 mcg by mouth daily before breakfast.   Yes Historical Provider, MD  metoprolol succinate (TOPROL-XL) 25 MG 24 hr tablet Take 12.5 mg by mouth at bedtime.   Yes Historical Provider, MD  Multiple Vitamins-Minerals (MULTIVITAMIN ADULTS 50+) TABS Take 1 tablet by mouth daily.  Yes Historical Provider, MD  pantoprazole (PROTONIX) 40 MG tablet Take 1 tablet (40 mg total) by mouth 2 (two) times daily before a meal. Patient taking differently: Take 40 mg by mouth daily as needed. For acid reflux 05/07/15  Yes Theodoro Grist, MD  tamsulosin (FLOMAX) 0.4 MG CAPS capsule Take 0.4 mg by mouth daily.   Yes Historical Provider, MD  Levothyroxine Sodium 50 MCG CAPS Take 1 capsule (50 mcg total) by mouth daily before  breakfast. Patient not taking: Reported on 12/19/2015 11/29/15   Theodoro Grist, MD    Inpatient Medications:  . allopurinol  100 mg Oral Daily  . aspirin EC  81 mg Oral Daily  . atorvastatin  20 mg Oral QHS  . cholecalciferol  2,000 Units Oral Daily  . enalapril  5 mg Oral Daily  . enoxaparin (LOVENOX) injection  115 mg Subcutaneous Q12H  . furosemide  40 mg Oral Daily  . ipratropium-albuterol  3 mL Nebulization Q6H  . levothyroxine  75 mcg Oral QAC breakfast  . metoprolol succinate  12.5 mg Oral QHS  . multivitamin with minerals  1 tablet Oral Daily  . pantoprazole  40 mg Oral Daily  . sodium chloride flush  3 mL Intravenous Q12H  . tamsulosin  0.4 mg Oral Daily      Allergies: No Known Allergies  Social History   Social History  . Marital status: Single    Spouse name: N/A  . Number of children: N/A  . Years of education: N/A   Occupational History  . Not on file.   Social History Main Topics  . Smoking status: Former Research scientist (life sciences)  . Smokeless tobacco: Never Used  . Alcohol use No  . Drug use: No  . Sexual activity: Not on file   Other Topics Concern  . Not on file   Social History Narrative   Lives at home independently, uses a cane to ambulate. Also has a walker.     Family History  Problem Relation Age of Onset  . Osteoporosis Mother   . Heart Problems Father   . Stroke Maternal Grandfather   . Stroke Paternal Grandfather   . Stroke Paternal Grandmother      Review of Systems Positive for Chest pain shortness of breath palpitations Negative for: General:  chills, fever, night sweats or weight changes.  Cardiovascular: PND orthopnea syncope dizziness  Dermatological skin lesions rashes Respiratory: Cough congestion Urologic: Frequent urination urination at night and hematuria Abdominal: negative for nausea, vomiting, diarrhea, bright red blood per rectum, melena, or hematemesis Neurologic: negative for visual changes, and/or hearing changes  All other  systems reviewed and are otherwise negative except as noted above.  Labs:  Recent Labs  12/19/15 0837 12/19/15 1437  TROPONINI 0.12* 1.18*   Lab Results  Component Value Date   WBC 6.3 12/19/2015   HGB 9.7 (L) 12/19/2015   HCT 29.5 (L) 12/19/2015   MCV 92.0 12/19/2015   PLT 207 12/19/2015    Recent Labs Lab 12/19/15 0837  NA 139  K 4.1  CL 103  CO2 27  BUN 38*  CREATININE 2.09*  CALCIUM 8.5*  GLUCOSE 91   Lab Results  Component Value Date   CHOL 110 09/08/2012   HDL 41 09/08/2012   LDLCALC 55 09/08/2012   TRIG 69 09/08/2012   No results found for: DDIMER  Radiology/Studies:  Dg Chest 1 View  Result Date: 12/19/2015 CLINICAL DATA:  New onset of shortness of breath starting last night EXAM: CHEST 1  VIEW COMPARISON:  11/26/2015 FINDINGS: Cardiomediastinal silhouette is stable. Status post CABG. No infiltrate or pleural effusion. Mild elevation of the left hemidiaphragm. Mild basilar atelectasis. IMPRESSION: Status post CABG. Mild elevation of the left hemidiaphragm with left basilar atelectasis. No pulmonary edema. Electronically Signed   By: Lahoma Crocker M.D.   On: 12/19/2015 09:20   Dg Chest 2 View  Result Date: 11/26/2015 CLINICAL DATA:  Weakness, cough and shortness of breath. EXAM: CHEST  2 VIEW COMPARISON:  Chest radiograph 05/05/2015 FINDINGS: Median sternotomy wires are again seen. Cardiomediastinal contours are unchanged. No pneumothorax or sizable pleural effusion. Mild opacities in the left lung base appear unchanged. No focal consolidation or pulmonary edema. IMPRESSION: No active cardiopulmonary disease. Electronically Signed   By: Ulyses Jarred M.D.   On: 11/26/2015 17:09   Mr Thoracic Spine Wo Contrast  Result Date: 11/28/2015 CLINICAL DATA:  Thoracic back pain. New onset of lower extremity weakness. Multiple falls. EXAM: MRI THORACIC SPINE WITHOUT CONTRAST TECHNIQUE: Multiplanar, multisequence MR imaging of the thoracic spine was performed. No intravenous  contrast was administered. COMPARISON:  Lumbar MRI 11/27/2015, cervical spine CT 04/19/2013 and chest CT 03/16/2010. FINDINGS: Alignment: The thoracic alignment is normal. Sagittal localizing images of the cervical spine demonstrate stable mild spondylosis and straightening. Vertebrae: No acute or suspicious osseous findings. There is a prominent Schmorl's node involving the inferior endplate of T9. Anterior osteophytes are most prominent at this level. Cord:  The thoracic cord appears normal in signal and caliber. Paraspinal and other soft tissues: No significant paraspinal abnormalities. Disc levels: Mild degenerative changes are present throughout the thoracic spine. There are paraspinal osteophytes, most prominent at T9-10. No disc herniation, spinal stenosis or nerve root encroachment is present. IMPRESSION: 1. No acute findings. No cord deformity, spinal stenosis or nerve root encroachment. 2. Endplate osteophytes throughout the thoracic spine with prominent Schmorl's node involving the inferior endplate of T9. Electronically Signed   By: Richardean Sale M.D.   On: 11/28/2015 15:05   Mr Lumbar Spine Wo Contrast  Result Date: 11/27/2015 CLINICAL DATA:  New onset lower extremity weakness.  Multiple falls. EXAM: MRI LUMBAR SPINE WITHOUT CONTRAST TECHNIQUE: Multiplanar, multisequence MR imaging of the lumbar spine was performed. No intravenous contrast was administered. COMPARISON:  None. FINDINGS: Segmentation: Normal. The lowest disc space is considered to be L5-S1. Alignment:  Normal Vertebrae: No acute compression fracture, facet edema or focal marrow lesion. Conus medullaris: Extends to the L1 level and appears normal. Paraspinal and other soft tissues: The visualized aorta, IVC and iliac vessels are normal. The visualized retroperitoneal organs and paraspinal soft tissues are normal. Disc levels: T11-T12: Evaluated on sagittal images only. No significant disc herniation, spinal canal stenosis or  neuroforaminal narrowing. T12-L1:  Normal L1-L2: Normal disc space and facet joints. No spinal canal stenosis. No neuroforaminal stenosis. L2-L3: Small diffuse disc bulge and bilateral moderate facet hypertrophy. Prominent Schmorl's node within the inferior endplate of L2. No spinal canal stenosis. Mild left neuroforaminal stenosis. L3-L4: Mild diffuse disc bulge and moderate bilateral facet hypertrophy. No spinal canal stenosis. No neuroforaminal stenosis. L4-L5: Moderate diffuse disc bulge with mild narrowing of both lateral recesses. Moderate facet hypertrophy. No spinal canal stenosis. Mild bilateral neuroforaminal stenosis. L5-S1: Mild disc bulge, right eccentric. No spinal canal stenosis. Moderate right and mild left neuroforaminal stenosis. IMPRESSION: 1. Multilevel degenerative disc disease and facet arthrosis with mild-to-moderate neural foraminal narrowing, greatest at right L5-S1. 2. No spinal canal stenosis. 3. Mild bilateral lateral recess narrowing at L4-L5. Correlate for  symptoms of L5 radiculopathy. Electronically Signed   By: Ulyses Jarred M.D.   On: 11/27/2015 19:36    EKG: Atrial fibrillation with controlled ventricular rate and nonspecific ST and T-wave changes  Weights: Filed Weights   12/19/15 0834 12/19/15 1248  Weight: 120.2 kg (265 lb) 115.7 kg (255 lb)     Physical Exam: Blood pressure (!) 124/57, pulse 97, temperature 98.1 F (36.7 C), temperature source Oral, resp. rate 19, height 6' (1.829 m), weight 115.7 kg (255 lb), SpO2 97 %. Body mass index is 34.58 kg/m. General: Well developed, well nourished, in no acute distress. Head eyes ears nose throat: Normocephalic, atraumatic, sclera non-icteric, no xanthomas, nares are without discharge. No apparent thyromegaly and/or mass  Lungs: Normal respiratory effort.  no wheezes,Basilar rales, no rhonchi.  Heart: Irregular with normal S1 S2. no murmur gallop, no rub, PMI is normal size and placement, carotid upstroke normal  without bruit, jugular venous pressure is normal Abdomen: Soft, non-tender, non-distended with normoactive bowel sounds. No hepatomegaly. No rebound/guarding. No obvious abdominal masses. Abdominal aorta is normal size without bruit Extremities: 1+ edema. no cyanosis, no clubbing, no ulcers  Peripheral : 2+ bilateral upper extremity pulses, 2+ bilateral femoral pulses, 2+ bilateral dorsal pedal pulse Neuro: Alert and oriented. No facial asymmetry. No focal deficit. Moves all extremities spontaneously. Musculoskeletal: Normal muscle tone without kyphosis Psych:  Responds to questions appropriately with a normal affect.    Assessment: 82 year old male with chronic nonvalvular atrial fibrillation with rapid ventricular rate and non-ST elevation myocardial infarction possibly exacerbated by hypothyroidism chronic kidney disease stage III and known coronary artery disease.  Plan: 1. Heparin for further risk reducing of atrial fibrillation stroke risk and also for non-ST elevation myocardial infarction 2. Continue metoprolol at current dose of 12.5 mg for heart rate control of atrial fibrillation which is chronic and nonvalvular in nature 3. Further treatment of hypothyroidism possibly exacerbating above 4. High intensity cholesterol therapy with atorvastatin due to myocardial infarction and coronary artery disease 5. Echocardiogram for LV systolic dysfunction valvular heart disease contributing to above 6. Begin ambulation and following for worsening symptoms of chest pain and other significant symptoms with adjustments of medication management according to above. Would further consider in or vaginal diagnostics that depending on improvements  Signed, Corey Skains M.D. [facility] 12/19/2015, 7:04 PM

## 2015-12-19 NOTE — Progress Notes (Addendum)
   [place] [facility] Day: 0 days Gary Grant is a 82 y.o. male presenting with atrial fibrillation rapid ventricular response, respiratory insufficiency .   Advance care planning discussed with patient  without additional Family at bedside. All questions in regards to overall condition and expected prognosis answered. Patient states that he is had this discussion with his wife when she was living and wishes to be made DO NOT RESUSCITATE   CODE STATUS: dnr Time spent: 18 minutes

## 2015-12-19 NOTE — Care Management Obs Status (Signed)
[facility] NOTIFICATION   Patient Details  Name: Gary Grant MRN: [id number] Date of Birth: 12/07/1923   Medicare Observation Status Notification Given:  Yes    Ival Bible, RN 12/19/2015, 11:41 AM

## 2015-12-19 NOTE — ED Triage Notes (Addendum)
Pt came to ED via EMS from home c/o sob. Per EMS, O2 was 68% r/a. HR was elevated, no history of afib. PT denies pain, HR in 90s upon arrival, oxygen 100% on 2 L [place].

## 2015-12-19 NOTE — ED Notes (Signed)
X-ray at bedside

## 2015-12-19 NOTE — Progress Notes (Signed)
ANTICOAGULATION CONSULT NOTE - Initial Consult  Pharmacy Consult for enoxaparin Indication: chest pain/ACS and NSTEMI  No Known Allergies  Patient Measurements: Height: 6' (182.9 cm) Weight: 255 lb (115.7 kg) IBW/kg (Calculated) : 77.6 Heparin Dosing Weight: 102.6kg  Vital Signs: Temp: 98.1 F (36.7 C) (10/22 1247) Temp Source: Oral (10/22 1247) BP: 124/57 (10/22 1247) Pulse Rate: 97 (10/22 1247)  Labs:  Recent Labs  12/19/15 0837 12/19/15 1437  HGB 9.7*  --   HCT 29.5*  --   PLT 207  --   CREATININE 2.09*  --   TROPONINI 0.12* 1.18*    Estimated Creatinine Clearance: 31.5 mL/min (by C-G formula based on SCr of 2.09 mg/dL (H)).   Medical History: Past Medical History:  Diagnosis Date  . CAD (coronary artery disease)   . Chronic a-fib ([place])   . CKD (chronic kidney disease)   . COPD (chronic obstructive pulmonary disease) ([place])   . GERD (gastroesophageal reflux disease)   . HTN (hypertension)   . Hypothyroidism   . PAD (peripheral artery disease) ([place])   . Prostate cancer ([place])   . SSS (sick sinus syndrome) ([place])   . Venous insufficiency     Assessment: 82yo M who presents with SOB and atrial fibrillation with elevated troponin's.   Goal of Therapy:  Monitor platelets by anticoagulation protocol: Yes   Plan:  Initiated Lovenox 115 mg (1mg /kg) SQ Q12h. Monitor SCr as patient is close to CrCl of 90ml/min. May need to adjust accordingly. Monitor for signs and symptoms of bleeding.   Loree Fee, PharmD 12/19/2015,3:38 PM

## 2015-12-20 ENCOUNTER — Observation Stay
Admit: 2015-12-20 | Discharge: 2015-12-20 | Disposition: A | Discharge status: Home / Self Care | Payer: Medicare HMO | Attending: Internal Medicine | Admitting: Internal Medicine

## 2015-12-20 DIAGNOSIS — I482 Chronic atrial fibrillation: Secondary | ICD-10-CM | POA: Diagnosis present

## 2015-12-20 DIAGNOSIS — Z7982 Long term (current) use of aspirin: Secondary | ICD-10-CM | POA: Diagnosis not present

## 2015-12-20 DIAGNOSIS — R000 Tachycardia, unspecified: Secondary | ICD-10-CM | POA: Diagnosis present

## 2015-12-20 DIAGNOSIS — I2510 Atherosclerotic heart disease of native coronary artery without angina pectoris: Secondary | ICD-10-CM | POA: Diagnosis present

## 2015-12-20 DIAGNOSIS — I129 Hypertensive chronic kidney disease with stage 1 through stage 4 chronic kidney disease, or unspecified chronic kidney disease: Secondary | ICD-10-CM | POA: Diagnosis present

## 2015-12-20 DIAGNOSIS — K219 Gastro-esophageal reflux disease without esophagitis: Secondary | ICD-10-CM | POA: Diagnosis present

## 2015-12-20 DIAGNOSIS — Z951 Presence of aortocoronary bypass graft: Secondary | ICD-10-CM | POA: Diagnosis not present

## 2015-12-20 DIAGNOSIS — I739 Peripheral vascular disease, unspecified: Secondary | ICD-10-CM | POA: Diagnosis present

## 2015-12-20 DIAGNOSIS — Z79899 Other long term (current) drug therapy: Secondary | ICD-10-CM | POA: Diagnosis not present

## 2015-12-20 DIAGNOSIS — R0902 Hypoxemia: Secondary | ICD-10-CM | POA: Diagnosis present

## 2015-12-20 DIAGNOSIS — Z87891 Personal history of nicotine dependence: Secondary | ICD-10-CM | POA: Diagnosis not present

## 2015-12-20 DIAGNOSIS — E039 Hypothyroidism, unspecified: Secondary | ICD-10-CM | POA: Diagnosis present

## 2015-12-20 DIAGNOSIS — Z8546 Personal history of malignant neoplasm of prostate: Secondary | ICD-10-CM | POA: Diagnosis not present

## 2015-12-20 DIAGNOSIS — R0689 Other abnormalities of breathing: Secondary | ICD-10-CM | POA: Diagnosis present

## 2015-12-20 DIAGNOSIS — I214 Non-ST elevation (NSTEMI) myocardial infarction: Secondary | ICD-10-CM | POA: Diagnosis present

## 2015-12-20 DIAGNOSIS — J449 Chronic obstructive pulmonary disease, unspecified: Secondary | ICD-10-CM | POA: Diagnosis present

## 2015-12-20 DIAGNOSIS — Z66 Do not resuscitate: Secondary | ICD-10-CM | POA: Diagnosis present

## 2015-12-20 DIAGNOSIS — N183 Chronic kidney disease, stage 3 (moderate): Secondary | ICD-10-CM | POA: Diagnosis present

## 2015-12-20 LAB — ECHOCARDIOGRAM COMPLETE
Height: 72 in
Weight: 4080 oz

## 2015-12-20 LAB — TROPONIN I: TROPONIN I: 0.72 ng/mL — AB (ref <0.03)

## 2015-12-20 MED ORDER — IPRATROPIUM-ALBUTEROL 0.5-2.5 (3) MG/3ML IN SOLN
3.0000 mL | Freq: Three times a day (TID) | RESPIRATORY_TRACT | Status: DC
  Administered 2015-12-21: 3 mL via RESPIRATORY_TRACT
  Filled 2015-12-20 (×2): qty 3

## 2015-12-20 NOTE — Progress Notes (Signed)
*  PRELIMINARY RESULTS* Echocardiogram 2D Echocardiogram has been performed.  Sherrie Sport 12/20/2015, 10:41 AM

## 2015-12-20 NOTE — Progress Notes (Signed)
[place] at [facility]                                                                                                                                                                                            Patient Demographics   Gary Grant, is a 82 y.o. male, DOB - 02-Apr-1923, ZOX:[id number]  Admit date - 12/19/2015   Admitting Physician Lytle Butte, MD  Outpatient Primary MD for the patient is [facility]   LOS - 0  Subjective: Patient admitted with atrial fibrillation with rapid ventricular rate. Heart rate now improved. Troponin noted to be consistent with an non-ST MI patient does feel better   Review of Systems:   CONSTITUTIONAL: No documented fever. Positive fatigue and weakness. No weight gain, no weight loss.  EYES: No blurry or double vision.  ENT: No tinnitus. No postnasal drip. No redness of the oropharynx.  RESPIRATORY: No cough, no wheeze, no hemoptysis. No dyspnea.  CARDIOVASCULAR: No chest pain. No orthopnea. No palpitations. No syncope.  GASTROINTESTINAL: No nausea, no vomiting or diarrhea. No abdominal pain. No melena or hematochezia.  GENITOURINARY: No dysuria or hematuria.  ENDOCRINE: No polyuria or nocturia. No heat or cold intolerance.  HEMATOLOGY: No anemia. No bruising. No bleeding.  INTEGUMENTARY: No rashes. No lesions.  MUSCULOSKELETAL: No arthritis. No swelling. No gout.  NEUROLOGIC: No numbness, tingling, or ataxia. No seizure-type activity.  PSYCHIATRIC: No anxiety. No insomnia. No ADD.    Vitals:   Vitals:   12/20/15 0418 12/20/15 0832 12/20/15 0937 12/20/15 1110  BP: 132/63  (!) 121/49 (!) 136/57  Pulse: 65  93 89  Resp: 14   20  Temp: 98.4 F (36.9 C)   98.5 F (36.9 C)  TempSrc: Oral   Oral  SpO2: 95% 94% 93% 95%  Weight:      Height:        Wt Readings from Last 3 Encounters:  12/19/15 255 lb (115.7 kg)  11/26/15 246 lb 3.2 oz (111.7 kg)  05/06/15 263 lb 9.6 oz (119.6  kg)     Intake/Output Summary (Last 24 hours) at 12/20/15 1451 Last data filed at 12/20/15 1300  Gross per 24 hour  Intake              840 ml  Output             2025 ml  Net            -1185 ml    Physical Exam:   GENERAL: Pleasant-appearing in no apparent distress.  HEAD, EYES, EARS, NOSE AND THROAT: Atraumatic, normocephalic.  Extraocular muscles are intact. Pupils equal and reactive to light. Sclerae anicteric. No conjunctival injection. No oro-pharyngeal erythema.  NECK: Supple. There is no jugular venous distention. No bruits, no lymphadenopathy, no thyromegaly.  HEART: Regular rate and rhythm,. No murmurs, no rubs, no clicks.  LUNGS: Clear to auscultation bilaterally. No rales or rhonchi. No wheezes.  ABDOMEN: Soft, flat, nontender, nondistended. Has good bowel sounds. No hepatosplenomegaly appreciated.  EXTREMITIES: No evidence of any cyanosis, clubbing, or peripheral edema.  +2 pedal and radial pulses bilaterally.  NEUROLOGIC: The patient is alert, awake, and oriented x3 with no focal motor or sensory deficits appreciated bilaterally.  SKIN: Moist and warm with no rashes appreciated.  Psych: Not anxious, depressed LN: No inguinal LN enlargement    Antibiotics   Anti-infectives    None      Medications   Scheduled Meds: . allopurinol  100 mg Oral Daily  . aspirin EC  81 mg Oral Daily  . atorvastatin  20 mg Oral QHS  . cholecalciferol  2,000 Units Oral Daily  . enalapril  5 mg Oral Daily  . enoxaparin (LOVENOX) injection  115 mg Subcutaneous Q12H  . furosemide  40 mg Oral Daily  . ipratropium-albuterol  3 mL Nebulization Q6H  . levothyroxine  75 mcg Oral QAC breakfast  . metoprolol succinate  12.5 mg Oral QHS  . multivitamin with minerals  1 tablet Oral Daily  . pantoprazole  40 mg Oral Daily  . sodium chloride flush  3 mL Intravenous Q12H  . tamsulosin  0.4 mg Oral Daily   Continuous Infusions:  PRN Meds:.acetaminophen **OR** acetaminophen,  ipratropium-albuterol, ondansetron **OR** ondansetron (ZOFRAN) IV, oxyCODONE   Data Review:   Micro Results No results found for this or any previous visit (from the past 240 hour(s)).  Radiology Reports Dg Chest 1 View  Result Date: 12/19/2015 CLINICAL DATA:  New onset of shortness of breath starting last night EXAM: CHEST 1 VIEW COMPARISON:  11/26/2015 FINDINGS: Cardiomediastinal silhouette is stable. Status post CABG. No infiltrate or pleural effusion. Mild elevation of the left hemidiaphragm. Mild basilar atelectasis. IMPRESSION: Status post CABG. Mild elevation of the left hemidiaphragm with left basilar atelectasis. No pulmonary edema. Electronically Signed   By: Lahoma Crocker M.D.   On: 12/19/2015 09:20   Dg Chest 2 View  Result Date: 11/26/2015 CLINICAL DATA:  Weakness, cough and shortness of breath. EXAM: CHEST  2 VIEW COMPARISON:  Chest radiograph 05/05/2015 FINDINGS: Median sternotomy wires are again seen. Cardiomediastinal contours are unchanged. No pneumothorax or sizable pleural effusion. Mild opacities in the left lung base appear unchanged. No focal consolidation or pulmonary edema. IMPRESSION: No active cardiopulmonary disease. Electronically Signed   By: Ulyses Jarred M.D.   On: 11/26/2015 17:09   Mr Thoracic Spine Wo Contrast  Result Date: 11/28/2015 CLINICAL DATA:  Thoracic back pain. New onset of lower extremity weakness. Multiple falls. EXAM: MRI THORACIC SPINE WITHOUT CONTRAST TECHNIQUE: Multiplanar, multisequence MR imaging of the thoracic spine was performed. No intravenous contrast was administered. COMPARISON:  Lumbar MRI 11/27/2015, cervical spine CT 04/19/2013 and chest CT 03/16/2010. FINDINGS: Alignment: The thoracic alignment is normal. Sagittal localizing images of the cervical spine demonstrate stable mild spondylosis and straightening. Vertebrae: No acute or suspicious osseous findings. There is a prominent Schmorl's node involving the inferior endplate of T9.  Anterior osteophytes are most prominent at this level. Cord:  The thoracic cord appears normal in signal and caliber. Paraspinal and other soft tissues: No significant paraspinal abnormalities. Disc levels: Mild  degenerative changes are present throughout the thoracic spine. There are paraspinal osteophytes, most prominent at T9-10. No disc herniation, spinal stenosis or nerve root encroachment is present. IMPRESSION: 1. No acute findings. No cord deformity, spinal stenosis or nerve root encroachment. 2. Endplate osteophytes throughout the thoracic spine with prominent Schmorl's node involving the inferior endplate of T9. Electronically Signed   By: Richardean Sale M.D.   On: 11/28/2015 15:05   Mr Lumbar Spine Wo Contrast  Result Date: 11/27/2015 CLINICAL DATA:  New onset lower extremity weakness.  Multiple falls. EXAM: MRI LUMBAR SPINE WITHOUT CONTRAST TECHNIQUE: Multiplanar, multisequence MR imaging of the lumbar spine was performed. No intravenous contrast was administered. COMPARISON:  None. FINDINGS: Segmentation: Normal. The lowest disc space is considered to be L5-S1. Alignment:  Normal Vertebrae: No acute compression fracture, facet edema or focal marrow lesion. Conus medullaris: Extends to the L1 level and appears normal. Paraspinal and other soft tissues: The visualized aorta, IVC and iliac vessels are normal. The visualized retroperitoneal organs and paraspinal soft tissues are normal. Disc levels: T11-T12: Evaluated on sagittal images only. No significant disc herniation, spinal canal stenosis or neuroforaminal narrowing. T12-L1:  Normal L1-L2: Normal disc space and facet joints. No spinal canal stenosis. No neuroforaminal stenosis. L2-L3: Small diffuse disc bulge and bilateral moderate facet hypertrophy. Prominent Schmorl's node within the inferior endplate of L2. No spinal canal stenosis. Mild left neuroforaminal stenosis. L3-L4: Mild diffuse disc bulge and moderate bilateral facet hypertrophy. No  spinal canal stenosis. No neuroforaminal stenosis. L4-L5: Moderate diffuse disc bulge with mild narrowing of both lateral recesses. Moderate facet hypertrophy. No spinal canal stenosis. Mild bilateral neuroforaminal stenosis. L5-S1: Mild disc bulge, right eccentric. No spinal canal stenosis. Moderate right and mild left neuroforaminal stenosis. IMPRESSION: 1. Multilevel degenerative disc disease and facet arthrosis with mild-to-moderate neural foraminal narrowing, greatest at right L5-S1. 2. No spinal canal stenosis. 3. Mild bilateral lateral recess narrowing at L4-L5. Correlate for symptoms of L5 radiculopathy. Electronically Signed   By: Ulyses Jarred M.D.   On: 11/27/2015 19:36     CBC  Recent Labs Lab 12/19/15 0837  WBC 6.3  HGB 9.7*  HCT 29.5*  PLT 207  MCV 92.0  MCH 30.3  MCHC 32.9  RDW 17.4*  LYMPHSABS 0.7*  MONOABS 0.6  EOSABS 0.2  BASOSABS 0.0    Chemistries   Recent Labs Lab 12/19/15 0837  NA 139  K 4.1  CL 103  CO2 27  GLUCOSE 91  BUN 38*  CREATININE 2.09*  CALCIUM 8.5*   ------------------------------------------------------------------------------------------------------------------ estimated creatinine clearance is 31.5 mL/min (by C-G formula based on SCr of 2.09 mg/dL (H)). ------------------------------------------------------------------------------------------------------------------ No results for input(s): HGBA1C in the last 72 hours. ------------------------------------------------------------------------------------------------------------------ No results for input(s): CHOL, HDL, LDLCALC, TRIG, CHOLHDL, LDLDIRECT in the last 72 hours. ------------------------------------------------------------------------------------------------------------------  Recent Labs  12/19/15 0837  TSH 12.048*   ------------------------------------------------------------------------------------------------------------------ No results for input(s): VITAMINB12,  FOLATE, FERRITIN, TIBC, IRON, RETICCTPCT in the last 72 hours.  Coagulation profile No results for input(s): INR, PROTIME in the last 168 hours.  No results for input(s): DDIMER in the last 72 hours.  Cardiac Enzymes  Recent Labs Lab 12/19/15 1437 12/19/15 2028 12/20/15 0122  TROPONINI 1.18* 1.00* 0.72*   ------------------------------------------------------------------------------------------------------------------ Invalid input(s): POCBNP    Assessment & Plan   82 year old African-American gentleman history of atrial fibrillation not on anticoagulants given fall risk and hypothyroidism who is presenting with shortness of breath  1. Atrial fibrillation rapid ventricular response:  Patient was hospitalized with slow heart  rate previously. Is currently on Cardizem will monitor heart rate Patient not candidate for full dose anticoagulation due to fall risk 2. Hypoxic respiratory insufficiency: In relation to rapid ventricular response, has improved as heart rate has improved 3. Non-ST MI cardiology recommends Lovenox therapy for 24 hours 4. Hypothyroidism unspecified: Had recent increased dose to 75 g of Synthroid patient's TSH is still elevated but in light of A. fib with RVR and I will not change his Synthroid 5. GERD without esophagitis PPI therapy 6. Hypertension essential: Continue Enalapril, Toprol     Code Status Orders        Start     Ordered   12/19/15 1108  Full code  Continuous     12/19/15 1107    Code Status History    Date Active Date Inactive Code Status Order ID Comments User Context   11/26/2015  7:11 PM 11/29/2015  7:44 PM Full Code [id number]  Lytle Butte, MD ED   05/06/2015 12:28 AM 05/07/2015  4:50 PM Full Code [id number]  Gladstone Lighter, MD Inpatient           Consults  Cardiology   DVT Prophylaxis  Lovenox  Lab Results  Component Value Date   PLT 207 12/19/2015     Time Spent in minutes  35 minutes  Greater than 50% of time  spent in care coordination and counseling patient regarding the condition and plan of care.   Dustin Flock M.D on 12/20/2015 at 2:51 PM  Between 7am to 6pm - Pager - [phone]  After 6pm go to [email or web address] - password [email or web address] [facility]   Office  [phone]

## 2015-12-20 NOTE — Progress Notes (Signed)
Pt. Slept well throughout the night with no SOB, c/o pain or acute distress noted.

## 2015-12-20 NOTE — Progress Notes (Signed)
[facility]  Patient Status: Active  [facility] is providing the following services: PT  If patient discharges after hours, please call [phone].   Gary Grant 12/20/2015, 9:31 AM

## 2015-12-20 NOTE — Progress Notes (Signed)
[facility] Encounter Note  Patient: Gary Grant / Admit Date: 12/19/2015 / Date of Encounter: 12/20/2015, 8:26 AM   Subjective: Patient feeling much better today with less chest pain and/or shortness of breath. Patient barely ambulating due to concerns of significant limb pain. Trivial fibrillation now in normal sinus rhythm after a significant bout of rapid ventricular rate of A. fib.  Review of Systems: Positive for: Shortness of breath and limb pain Negative for: Vision change, hearing change, syncope, dizziness, nausea, vomiting,diarrhea, bloody stool, stomach pain, cough, congestion, diaphoresis, urinary frequency, urinary pain,skin lesions, skin rashes Others previously listed  Objective: Telemetry: Sinus bradycardia Physical Exam: Blood pressure 132/63, pulse 65, temperature 98.4 F (36.9 C), temperature source Oral, resp. rate 14, height 6' (1.829 m), weight 115.7 kg (255 lb), SpO2 95 %. Body mass index is 34.58 kg/m. General: Well developed, well nourished, in no acute distress. Head: Normocephalic, atraumatic, sclera non-icteric, no xanthomas, nares are without discharge. Neck: No apparent masses Lungs: Normal respirations with some wheezes, no rhonchi, no rales , no crackles   Heart: Regular rate and rhythm, normal S1 S2, subtle apical murmur, no rub, no gallop, PMI is normal size and placement, carotid upstroke normal without bruit, jugular venous pressure normal Abdomen: Soft, non-tender, distended with normoactive bowel sounds. No hepatosplenomegaly. Abdominal aorta is normal size without bruit Extremities: 1+ edema, no clubbing, no cyanosis, no ulcers,  Peripheral: 2+ radial, 2+ femoral, 1 + dorsal pedal pulses Neuro: Alert and oriented. Moves all extremities spontaneously. Psych:  Responds to questions appropriately with a normal affect.   Intake/Output Summary (Last 24 hours) at 12/20/15 0826 Last data filed at 12/20/15 0700  Gross per 24  hour  Intake              540 ml  Output             1325 ml  Net             -785 ml    Inpatient Medications:  . allopurinol  100 mg Oral Daily  . aspirin EC  81 mg Oral Daily  . atorvastatin  20 mg Oral QHS  . cholecalciferol  2,000 Units Oral Daily  . enalapril  5 mg Oral Daily  . enoxaparin (LOVENOX) injection  115 mg Subcutaneous Q12H  . furosemide  40 mg Oral Daily  . ipratropium-albuterol  3 mL Nebulization Q6H  . levothyroxine  75 mcg Oral QAC breakfast  . metoprolol succinate  12.5 mg Oral QHS  . multivitamin with minerals  1 tablet Oral Daily  . pantoprazole  40 mg Oral Daily  . sodium chloride flush  3 mL Intravenous Q12H  . tamsulosin  0.4 mg Oral Daily   Infusions:    Labs:  Recent Labs  12/19/15 0837  NA 139  K 4.1  CL 103  CO2 27  GLUCOSE 91  BUN 38*  CREATININE 2.09*  CALCIUM 8.5*   No results for input(s): AST, ALT, ALKPHOS, BILITOT, PROT, ALBUMIN in the last 72 hours.  Recent Labs  12/19/15 0837  WBC 6.3  NEUTROABS 4.7  HGB 9.7*  HCT 29.5*  MCV 92.0  PLT 207    Recent Labs  12/19/15 0837 12/19/15 1437 12/19/15 2028 12/20/15 0122  TROPONINI 0.12* 1.18* 1.00* 0.72*   Invalid input(s): POCBNP No results for input(s): HGBA1C in the last 72 hours.   Weights: Filed Weights   12/19/15 0834 12/19/15 1248  Weight: 120.2 kg (265 lb) 115.7 kg (255  lb)     Radiology/Studies:  Dg Chest 1 View  Result Date: 12/19/2015 CLINICAL DATA:  New onset of shortness of breath starting last night EXAM: CHEST 1 VIEW COMPARISON:  11/26/2015 FINDINGS: Cardiomediastinal silhouette is stable. Status post CABG. No infiltrate or pleural effusion. Mild elevation of the left hemidiaphragm. Mild basilar atelectasis. IMPRESSION: Status post CABG. Mild elevation of the left hemidiaphragm with left basilar atelectasis. No pulmonary edema. Electronically Signed   By: Lahoma Crocker M.D.   On: 12/19/2015 09:20   Dg Chest 2 View  Result Date: 11/26/2015 CLINICAL  DATA:  Weakness, cough and shortness of breath. EXAM: CHEST  2 VIEW COMPARISON:  Chest radiograph 05/05/2015 FINDINGS: Median sternotomy wires are again seen. Cardiomediastinal contours are unchanged. No pneumothorax or sizable pleural effusion. Mild opacities in the left lung base appear unchanged. No focal consolidation or pulmonary edema. IMPRESSION: No active cardiopulmonary disease. Electronically Signed   By: Ulyses Jarred M.D.   On: 11/26/2015 17:09   Mr Thoracic Spine Wo Contrast  Result Date: 11/28/2015 CLINICAL DATA:  Thoracic back pain. New onset of lower extremity weakness. Multiple falls. EXAM: MRI THORACIC SPINE WITHOUT CONTRAST TECHNIQUE: Multiplanar, multisequence MR imaging of the thoracic spine was performed. No intravenous contrast was administered. COMPARISON:  Lumbar MRI 11/27/2015, cervical spine CT 04/19/2013 and chest CT 03/16/2010. FINDINGS: Alignment: The thoracic alignment is normal. Sagittal localizing images of the cervical spine demonstrate stable mild spondylosis and straightening. Vertebrae: No acute or suspicious osseous findings. There is a prominent Schmorl's node involving the inferior endplate of T9. Anterior osteophytes are most prominent at this level. Cord:  The thoracic cord appears normal in signal and caliber. Paraspinal and other soft tissues: No significant paraspinal abnormalities. Disc levels: Mild degenerative changes are present throughout the thoracic spine. There are paraspinal osteophytes, most prominent at T9-10. No disc herniation, spinal stenosis or nerve root encroachment is present. IMPRESSION: 1. No acute findings. No cord deformity, spinal stenosis or nerve root encroachment. 2. Endplate osteophytes throughout the thoracic spine with prominent Schmorl's node involving the inferior endplate of T9. Electronically Signed   By: Richardean Sale M.D.   On: 11/28/2015 15:05   Mr Lumbar Spine Wo Contrast  Result Date: 11/27/2015 CLINICAL DATA:  New onset  lower extremity weakness.  Multiple falls. EXAM: MRI LUMBAR SPINE WITHOUT CONTRAST TECHNIQUE: Multiplanar, multisequence MR imaging of the lumbar spine was performed. No intravenous contrast was administered. COMPARISON:  None. FINDINGS: Segmentation: Normal. The lowest disc space is considered to be L5-S1. Alignment:  Normal Vertebrae: No acute compression fracture, facet edema or focal marrow lesion. Conus medullaris: Extends to the L1 level and appears normal. Paraspinal and other soft tissues: The visualized aorta, IVC and iliac vessels are normal. The visualized retroperitoneal organs and paraspinal soft tissues are normal. Disc levels: T11-T12: Evaluated on sagittal images only. No significant disc herniation, spinal canal stenosis or neuroforaminal narrowing. T12-L1:  Normal L1-L2: Normal disc space and facet joints. No spinal canal stenosis. No neuroforaminal stenosis. L2-L3: Small diffuse disc bulge and bilateral moderate facet hypertrophy. Prominent Schmorl's node within the inferior endplate of L2. No spinal canal stenosis. Mild left neuroforaminal stenosis. L3-L4: Mild diffuse disc bulge and moderate bilateral facet hypertrophy. No spinal canal stenosis. No neuroforaminal stenosis. L4-L5: Moderate diffuse disc bulge with mild narrowing of both lateral recesses. Moderate facet hypertrophy. No spinal canal stenosis. Mild bilateral neuroforaminal stenosis. L5-S1: Mild disc bulge, right eccentric. No spinal canal stenosis. Moderate right and mild left neuroforaminal stenosis. IMPRESSION: 1. Multilevel  degenerative disc disease and facet arthrosis with mild-to-moderate neural foraminal narrowing, greatest at right L5-S1. 2. No spinal canal stenosis. 3. Mild bilateral lateral recess narrowing at L4-L5. Correlate for symptoms of L5 radiculopathy. Electronically Signed   By: Ulyses Jarred M.D.   On: 11/27/2015 19:36     Assessment and Recommendation  82 y.o. male with known paroxysmal nonvalvular atrial  fibrillation with rapid ventricular rate now spontaneously converted to normal sinus rhythm likely exacerbated by acute non-ST elevation myocardial infarction and coronary artery disease now improved with less symptoms. Patient does have chronic kidney disease stage III and hypothyroidism possibly also contributing 1. Continue metoprolol for heart rate control and maintenance of normal sinus rhythm as well as treatment of non-ST elevation myocardial infarction 2. Heparin for 24 more hours for treatment of non-ST elevation myocardial infarction and further reinvestigation of the possibility of anticoagulation for stroke risk after that. Patient is at high risk for fall although may benefit from anticoagulation if is willing 3. Continue high intensity cholesterol therapy for coronary artery disease and non-ST elevation myocardial infarction 4. Furosemide for concerns of heart failure or pulmonary edema and lower extremity edema 5. Continue ACE inhibitor for hypertension control and coronary artery disease with myocardial infarction 6. Cardiac rehabilitation has been discussed and will start inpatient at this time if able 7. No further cardiac diagnostics necessary at this time and watch for any further significant symptoms with ambulation  Signed, Serafina Royals M.D. FACC

## 2015-12-20 NOTE — Progress Notes (Signed)
ANTICOAGULATION CONSULT NOTE - Follow Up Consult  Pharmacy Consult for Enoxaparin Indication: chest pain/ACS and atrial fibrillation  No Known Allergies  Patient Measurements: Height: 6' (182.9 cm) Weight: 255 lb (115.7 kg) IBW/kg (Calculated) : 77.6 Heparin Dosing Weight:    Vital Signs: Temp: 98.5 F (36.9 C) (10/23 1110) Temp Source: Oral (10/23 1110) BP: 136/57 (10/23 1110) Pulse Rate: 89 (10/23 1110)  Labs:  Recent Labs  12/19/15 0837 12/19/15 1437 12/19/15 2028 12/20/15 0122  HGB 9.7*  --   --   --   HCT 29.5*  --   --   --   PLT 207  --   --   --   CREATININE 2.09*  --   --   --   TROPONINI 0.12* 1.18* 1.00* 0.72*    Estimated Creatinine Clearance: 31.5 mL/min (by C-G formula based on SCr of 2.09 mg/dL (H)).   Medications:  Scheduled:  . allopurinol  100 mg Oral Daily  . aspirin EC  81 mg Oral Daily  . atorvastatin  20 mg Oral QHS  . cholecalciferol  2,000 Units Oral Daily  . enalapril  5 mg Oral Daily  . enoxaparin (LOVENOX) injection  115 mg Subcutaneous Q12H  . furosemide  40 mg Oral Daily  . ipratropium-albuterol  3 mL Nebulization Q6H  . levothyroxine  75 mcg Oral QAC breakfast  . metoprolol succinate  12.5 mg Oral QHS  . multivitamin with minerals  1 tablet Oral Daily  . pantoprazole  40 mg Oral Daily  . sodium chloride flush  3 mL Intravenous Q12H  . tamsulosin  0.4 mg Oral Daily    Assessment: Patient continues on Enoxaparin 115mg  SQ q12h for afibe and NSTEMI. Per cardiology note will need 48 hours of anticoagulation.  Goal of Therapy:  Monitor platelets by anticoagulation protocol: Yes   Plan:  Lovenox 115 mg SQ q12h. Will check bmet and CBC with 10/24 AM labs.  Paulina Fusi, PharmD, BCPS 12/20/2015 11:43 AM

## 2015-12-21 LAB — BASIC METABOLIC PANEL
Anion gap: 8 (ref 5–15)
BUN: 38 mg/dL — AB (ref 6–20)
CHLORIDE: 103 mmol/L (ref 101–111)
CO2: 29 mmol/L (ref 22–32)
Calcium: 8.5 mg/dL — ABNORMAL LOW (ref 8.9–10.3)
Creatinine, Ser: 2.26 mg/dL — ABNORMAL HIGH (ref 0.61–1.24)
GFR calc Af Amer: 28 mL/min — ABNORMAL LOW (ref >60)
GFR calc non Af Amer: 24 mL/min — ABNORMAL LOW (ref >60)
GLUCOSE: 106 mg/dL — AB (ref 65–99)
POTASSIUM: 4.5 mmol/L (ref 3.5–5.1)
SODIUM: 140 mmol/L (ref 135–145)

## 2015-12-21 LAB — CBC
HEMATOCRIT: 30.2 % — AB (ref 40.0–52.0)
Hemoglobin: 9.7 g/dL — ABNORMAL LOW (ref 13.0–18.0)
MCH: 30 pg (ref 26.0–34.0)
MCHC: 32.2 g/dL (ref 32.0–36.0)
MCV: 93.2 fL (ref 80.0–100.0)
Platelets: 192 10*3/uL (ref 150–440)
RBC: 3.24 MIL/uL — ABNORMAL LOW (ref 4.40–5.90)
RDW: 17.1 % — AB (ref 11.5–14.5)
WBC: 8 10*3/uL (ref 3.8–10.6)

## 2015-12-21 NOTE — Discharge Summary (Signed)
Gary Grant, 82 y.o., DOB Oct 10, 1923, MRN [id number]. Admission date: 12/19/2015 Discharge Date 12/21/2015 Primary MD [facility] Admitting Physician Lytle Butte, MD  Admission Diagnosis  Hypoxia [R09.02] Atrial fibrillation with RVR ([place]) [I48.91] Congestive heart failure, unspecified congestive heart failure chronicity, unspecified congestive heart failure type ([place]) [I50.9]  Discharge Diagnosis   Active Problems:  Hypoxia due to A. fib with RVR  Non-ST MI  CHF has been ruled out  Hypothyroidism  GERD  Essential hypertension  Generalized weakness and deconditioning     Hospital Course  Gary Grant  is a 82 y.o. male with a known history of Chronic atrial fibrillation not on anticoagulation given fall risk, hypothyroidism who is presenting with shortness of breath. He describes acute onset shortness of breath starting around midnight the day of admission progressively worsened on EMS arrival he is noted to be tachycardic heart rates. Patient in the ER was given medications to control his heart rate once his heart rate was controlled his shortness of breath resolved. Patient denied any chest pains or palpitations. His further workup showed that he's troponin was consistent with acute non-ST MI. Patient was symptomatically treated due to his chronic kidney disease and his age. Cardiology felt that medical management was appropriate. In terms of his A. fib is not a candidate for anticoagulation due to falls. Patient's heart rate is much improved. And is doing better. Was seen by physical therapy who recommended home health.            Consults  cardiology  Significant Tests:  See full reports for all details     Dg Chest 1 View  Result Date: 12/19/2015 CLINICAL DATA:  New onset of shortness of breath starting last night EXAM: CHEST 1 VIEW COMPARISON:  11/26/2015 FINDINGS: Cardiomediastinal silhouette is stable. Status post CABG. No infiltrate or  pleural effusion. Mild elevation of the left hemidiaphragm. Mild basilar atelectasis. IMPRESSION: Status post CABG. Mild elevation of the left hemidiaphragm with left basilar atelectasis. No pulmonary edema. Electronically Signed   By: Lahoma Crocker M.D.   On: 12/19/2015 09:20   Dg Chest 2 View  Result Date: 11/26/2015 CLINICAL DATA:  Weakness, cough and shortness of breath. EXAM: CHEST  2 VIEW COMPARISON:  Chest radiograph 05/05/2015 FINDINGS: Median sternotomy wires are again seen. Cardiomediastinal contours are unchanged. No pneumothorax or sizable pleural effusion. Mild opacities in the left lung base appear unchanged. No focal consolidation or pulmonary edema. IMPRESSION: No active cardiopulmonary disease. Electronically Signed   By: Gary Grant M.D.   On: 11/26/2015 17:09   Mr Thoracic Spine Wo Contrast  Result Date: 11/28/2015 CLINICAL DATA:  Thoracic back pain. New onset of lower extremity weakness. Multiple falls. EXAM: MRI THORACIC SPINE WITHOUT CONTRAST TECHNIQUE: Multiplanar, multisequence MR imaging of the thoracic spine was performed. No intravenous contrast was administered. COMPARISON:  Lumbar MRI 11/27/2015, cervical spine CT 04/19/2013 and chest CT 03/16/2010. FINDINGS: Alignment: The thoracic alignment is normal. Sagittal localizing images of the cervical spine demonstrate stable mild spondylosis and straightening. Vertebrae: No acute or suspicious osseous findings. There is a prominent Schmorl's node involving the inferior endplate of T9. Anterior osteophytes are most prominent at this level. Cord:  The thoracic cord appears normal in signal and caliber. Paraspinal and other soft tissues: No significant paraspinal abnormalities. Disc levels: Mild degenerative changes are present throughout the thoracic spine. There are paraspinal osteophytes, most prominent at T9-10. No disc herniation, spinal stenosis or nerve root encroachment is present. IMPRESSION: 1. No  acute findings. No cord  deformity, spinal stenosis or nerve root encroachment. 2. Endplate osteophytes throughout the thoracic spine with prominent Schmorl's node involving the inferior endplate of T9. Electronically Signed   By: Gary Grant M.D.   On: 11/28/2015 15:05   Mr Lumbar Spine Wo Contrast  Result Date: 11/27/2015 CLINICAL DATA:  New onset lower extremity weakness.  Multiple falls. EXAM: MRI LUMBAR SPINE WITHOUT CONTRAST TECHNIQUE: Multiplanar, multisequence MR imaging of the lumbar spine was performed. No intravenous contrast was administered. COMPARISON:  None. FINDINGS: Segmentation: Normal. The lowest disc space is considered to be L5-S1. Alignment:  Normal Vertebrae: No acute compression fracture, facet edema or focal marrow lesion. Conus medullaris: Extends to the L1 level and appears normal. Paraspinal and other soft tissues: The visualized aorta, IVC and iliac vessels are normal. The visualized retroperitoneal organs and paraspinal soft tissues are normal. Disc levels: T11-T12: Evaluated on sagittal images only. No significant disc herniation, spinal canal stenosis or neuroforaminal narrowing. T12-L1:  Normal L1-L2: Normal disc space and facet joints. No spinal canal stenosis. No neuroforaminal stenosis. L2-L3: Small diffuse disc bulge and bilateral moderate facet hypertrophy. Prominent Schmorl's node within the inferior endplate of L2. No spinal canal stenosis. Mild left neuroforaminal stenosis. L3-L4: Mild diffuse disc bulge and moderate bilateral facet hypertrophy. No spinal canal stenosis. No neuroforaminal stenosis. L4-L5: Moderate diffuse disc bulge with mild narrowing of both lateral recesses. Moderate facet hypertrophy. No spinal canal stenosis. Mild bilateral neuroforaminal stenosis. L5-S1: Mild disc bulge, right eccentric. No spinal canal stenosis. Moderate right and mild left neuroforaminal stenosis. IMPRESSION: 1. Multilevel degenerative disc disease and facet arthrosis with mild-to-moderate neural  foraminal narrowing, greatest at right L5-S1. 2. No spinal canal stenosis. 3. Mild bilateral lateral recess narrowing at L4-L5. Correlate for symptoms of L5 radiculopathy. Electronically Signed   By: Gary Grant M.D.   On: 11/27/2015 19:36       Today   Subjective:   Erlene Quan  patient feels better denies any chest pain or shortness of breath  Objective:   Blood pressure (!) 141/69, pulse (!) 111, temperature 98.4 F (36.9 C), temperature source Oral, resp. rate 18, height 6' (1.829 m), weight 255 lb (115.7 kg), SpO2 96 %.  .  Intake/Output Summary (Last 24 hours) at 12/21/15 1305 Last data filed at 12/21/15 0948  Gross per 24 hour  Intake              480 ml  Output             2025 ml  Net            -1545 ml    Exam VITAL SIGNS: Blood pressure (!) 141/69, pulse (!) 111, temperature 98.4 F (36.9 C), temperature source Oral, resp. rate 18, height 6' (1.829 m), weight 255 lb (115.7 kg), SpO2 96 %.  GENERAL:  82 y.o.-year-old patient lying in the bed with no acute distress.  EYES: Pupils equal, round, reactive to light and accommodation. No scleral icterus. Extraocular muscles intact.  HEENT: Head atraumatic, normocephalic. Oropharynx and nasopharynx clear.  NECK:  Supple, no jugular venous distention. No thyroid enlargement, no tenderness.  LUNGS: Normal breath sounds bilaterally, no wheezing, rales,rhonchi or crepitation. No use of accessory muscles of respiration.  CARDIOVASCULAR: S1, S2 normal. No murmurs, rubs, or gallops.  ABDOMEN: Soft, nontender, nondistended. Bowel sounds present. No organomegaly or mass.  EXTREMITIES: No pedal edema, cyanosis, or clubbing.  NEUROLOGIC: Cranial nerves II through XII are intact. Muscle strength 5/5 in  all extremities. Sensation intact. Gait not checked.  PSYCHIATRIC: The patient is alert and oriented x 3.  SKIN: No obvious rash, lesion, or ulcer.   Data Review     CBC w Diff: Lab Results  Component Value Date   WBC 8.0  12/21/2015   HGB 9.7 (L) 12/21/2015   HGB 11.8 (L) 05/30/2013   HCT 30.2 (L) 12/21/2015   HCT 36.2 (L) 05/30/2013   PLT 192 12/21/2015   PLT 163 05/30/2013   LYMPHOPCT 11 12/19/2015   LYMPHOPCT 12.5 09/08/2012   MONOPCT 10 12/19/2015   MONOPCT 11.6 09/08/2012   EOSPCT 3 12/19/2015   EOSPCT 4.7 09/08/2012   BASOPCT 1 12/19/2015   BASOPCT 0.5 09/08/2012   CMP: Lab Results  Component Value Date   NA 140 12/21/2015   NA 139 05/30/2013   K 4.5 12/21/2015   K 4.1 08/18/2013   CL 103 12/21/2015   CL 102 05/30/2013   CO2 29 12/21/2015   CO2 30 05/30/2013   BUN 38 (H) 12/21/2015   BUN 22 (H) 05/30/2013   CREATININE 2.26 (H) 12/21/2015   CREATININE 1.95 (H) 05/30/2013   PROT 6.4 (L) 11/27/2015   PROT 6.9 05/30/2013   ALBUMIN 3.5 11/27/2015   ALBUMIN 3.2 (L) 05/30/2013   BILITOT 0.4 11/27/2015   BILITOT 0.3 05/30/2013   ALKPHOS 62 11/27/2015   ALKPHOS 85 05/30/2013   AST 20 11/27/2015   AST 13 (L) 05/30/2013   ALT 14 (L) 11/27/2015   ALT 18 05/30/2013  .  Micro Results No results found for this or any previous visit (from the past 240 hour(s)).      Code Status Orders        Start     Ordered   12/19/15 1108  Full code  Continuous     12/19/15 1107    Code Status History    Date Active Date Inactive Code Status Order ID Comments User Context   11/26/2015  7:11 PM 11/29/2015  7:44 PM Full Code [id number]  Lytle Butte, MD ED   05/06/2015 12:28 AM 05/07/2015  4:50 PM Full Code [id number]  Gladstone Lighter, MD Inpatient          Follow-up Information    [facility] Go on 12/27/2015.   Specialty:  General Practice Why:  at 9:00am Contact information: [place] [address] [phone]        Corey Skains, MD. Go on 12/28/2015.   Specialty:  Cardiology Why:  at 10:45am Contact information: [facility] [place] [address] [phone]           Discharge  Medications     Medication List    STOP taking these medications   furosemide 40 MG tablet Commonly known as:  LASIX     TAKE these medications   acetaminophen 325 MG tablet Commonly known as:  TYLENOL Take 325-650 mg by mouth every 6 (six) hours as needed for moderate pain or fever.   allopurinol 100 MG tablet Commonly known as:  ZYLOPRIM Take 100 mg by mouth daily.   aspirin EC 81 MG tablet Take 81 mg by mouth daily.   atorvastatin 20 MG tablet Commonly known as:  LIPITOR Take 20 mg by mouth at bedtime.   Cholecalciferol 2000 units Tabs Take 2,000 Units by mouth daily.   enalapril 5 MG tablet Commonly known as:  VASOTEC Take 5 mg by mouth daily.   levothyroxine 75 MCG tablet  Commonly known as:  SYNTHROID, LEVOTHROID Take 75 mcg by mouth daily before breakfast.   Levothyroxine Sodium 50 MCG Caps Take 1 capsule (50 mcg total) by mouth daily before breakfast.   metoprolol succinate 25 MG 24 hr tablet Commonly known as:  TOPROL-XL Take 12.5 mg by mouth at bedtime.   MULTIVITAMIN ADULTS 50+ Tabs Take 1 tablet by mouth daily.   pantoprazole 40 MG tablet Commonly known as:  PROTONIX Take 1 tablet (40 mg total) by mouth 2 (two) times daily before a meal. What changed:  when to take this  reasons to take this  additional instructions   tamsulosin 0.4 MG Caps capsule Commonly known as:  FLOMAX Take 0.4 mg by mouth daily.          Total Time in preparing paper work, data evaluation and todays exam - 35 minutes  Dustin Flock M.D on 12/21/2015 at 1:05 PM  [facility]   Office  [phone]

## 2015-12-21 NOTE — Evaluation (Signed)
Physical Therapy Evaluation Patient Details Name: Gary Grant MRN: [id number] DOB: 12/27/1923 Today's Date: 12/21/2015   History of Present Illness  Pt is an 82 y.o. male presenting to hospital with SOB, tachycardic with HR 150-200's, and sats mid 60's on RA.  Pt admitted to hospital with a-fib with RVR, hypoxic respiratory insufficiency, elevated cardiac enzymes, and NSTEMI.  PMH includes SSS, a-fib, CAD, COPD, PAD, h/o falls, CABG, CKD.  Clinical Impression  Prior to admission, pt was recently discharged from [facility] and ambulating with RW.  Pt lives with his daughter in 1 level home with 4 stairs to enter (pt reports he thinks they have a R railing for him to use).  Currently pt is CGA with transfers and ambulation 70 feet with RW (limited to household distances d/t SOB and fatigue).  Pt steady without loss of balance with this functional mobility.  Pt CGA using R railing navigating up 4 stairs but required min assist x2 and heavy use of railing and leaning against wall to descend 4 stairs (see note below for details).  Pt would benefit from skilled PT to address noted impairments and functional limitations.  Recommend pt discharge to home with SBA for transfers and ambulation (using RW) and assist for stairs (nursing and CM notified of assist levels required for stairs) when medically appropriate; also recommend HHPT.     Follow Up Recommendations Home health PT;Supervision for mobility/OOB (assist for stairs)    Equipment Recommendations   (Pt reports already owning RW)    Recommendations for Other Services       Precautions / Restrictions Precautions Precautions: Fall Restrictions Weight Bearing Restrictions: No      Mobility  Bed Mobility Overal bed mobility:  (Pt sitting on edge of bed upon PT entering room and reports no difficulty with bed mobility (supine to/from sit))                Transfers Overall transfer level: Needs assistance Equipment used: Rolling  walker (2 wheeled) Transfers: Sit to/from Stand Sit to Stand: Min guard Stand pivot transfers: Min guard       General transfer comment: pt requiring increased effort and time to stand on his own; heavy use of UE's  Ambulation/Gait Ambulation/Gait assistance: Min guard;+2 safety/equipment (chair follow for safety) Ambulation Distance (Feet): 70 Feet Assistive device: Rolling walker (2 wheeled)   Gait velocity: decreased   General Gait Details: decreased B step length/foot clearance/heelstrike; 1 brief standing rest break d/t SOB  Stairs Stairs: Yes Stairs assistance: +2 safety/equipment (CGA ascending stairs; min assist x2 descending stairs) Stair Management: One rail Right;Step to pattern;Forwards Number of Stairs: 4 General stair comments: pt able to navigate 4 stairs ascending with CGA and use of R railing (SBA of 2nd for safety) without any difficulties except demonstrated SOB once up 4 stairs requiring standing rest break; pt with heavy use of B UE's on railing and leaning against wall to descend 4 stairs with min assist x2 (B knees also flexing significantly concerning for buckling although no buckling actually occurred)  Wheelchair Mobility    Modified Rankin (Stroke Patients Only)       Balance Overall balance assessment: History of Falls Sitting-balance support: Bilateral upper extremity supported;Feet supported Sitting balance-Leahy Scale: Good     Standing balance support: Bilateral upper extremity supported (on RW) Standing balance-Leahy Scale: Good  Pertinent Vitals/Pain Pain Assessment: No/denies pain  HR 96-111 bpm during session.    Home Living Family/patient expects to be discharged to:: Private residence Living Arrangements: Children (Pt's daughter) Available Help at Discharge: Family Type of Home: House Home Access: Stairs to enter   [employer] of Steps: 4 (pt reports family was putting in R  railing and was hoping it was done) Home Layout: One level Home Equipment: Environmental consultant - 2 wheels;Cane - single point Additional Comments: Pt reports his daughter works nights and is around during the day (but may be sleeping).    Prior Function Level of Independence: Independent with assistive device(s)         Comments: Pt recently discharged from [facility] and was using a RW and receiving HHPT.     Hand Dominance        Extremity/Trunk Assessment   Upper Extremity Assessment: Generalized weakness           Lower Extremity Assessment: Generalized weakness         Communication   Communication: HOH  Cognition Arousal/Alertness: Awake/alert Behavior During Therapy: WFL for tasks assessed/performed Overall Cognitive Status: Within Functional Limits for tasks assessed                      General Comments   Nursing cleared pt for participation in physical therapy.  Pt agreeable to PT session.    Exercises  Stairs training   Assessment/Plan    PT Assessment Patient needs continued PT services  PT Problem List Decreased strength;Decreased activity tolerance;Decreased balance;Decreased mobility          PT Treatment Interventions DME instruction;Gait training;Stair training;Functional mobility training;Therapeutic activities;Therapeutic exercise;Balance training;Patient/family education    PT Goals (Current goals can be found in the Care Plan section)  Acute Rehab PT Goals Patient Stated Goal: to improve activity tolerance PT Goal Formulation: With patient Time For Goal Achievement: 01/04/16 Potential to Achieve Goals: Good    Frequency Min 2X/week   Barriers to discharge Other (comment) (Potentially stairs)      Co-evaluation               End of Session Equipment Utilized During Treatment: Gait belt Activity Tolerance: Patient limited by fatigue Patient left: in chair;with call bell/phone within reach;with chair alarm set Nurse Communication:  Mobility status;Precautions         Time: 5498-2641 PT Time Calculation (min) (ACUTE ONLY): 25 min   Charges:   PT Evaluation $PT Eval Low Complexity: 1 Procedure PT Treatments $Gait Training: 8-22 mins   PT G CodesLeitha Bleak 2015-12-30, 1:14 PM Leitha Bleak, [place]

## 2015-12-21 NOTE — Discharge Instructions (Signed)
°  DIET:  Cardiac diet  DISCHARGE CONDITION:  Stable  ACTIVITY:  Activity as tolerated  OXYGEN:  Home Oxygen: No.   Oxygen Delivery: room air  DISCHARGE LOCATION:  home    ADDITIONAL DISCHARGE INSTRUCTION: dont take lasix until seen by primary md   If you experience worsening of your admission symptoms, develop shortness of breath, life threatening emergency, suicidal or homicidal thoughts you must seek medical attention immediately by calling 911 or calling your MD immediately  if symptoms less severe.  You Must read complete instructions/literature along with all the possible adverse reactions/side effects for all the Medicines you take and that have been prescribed to you. Take any new Medicines after you have completely understood and accpet all the possible adverse reactions/side effects.   Please note  You were cared for by a hospitalist during your hospital stay. If you have any questions about your discharge medications or the care you received while you were in the hospital after you are discharged, you can call the unit and asked to speak with the hospitalist on call if the hospitalist that took care of you is not available. Once you are discharged, your primary care physician will handle any further medical issues. Please note that NO REFILLS for any discharge medications will be authorized once you are discharged, as it is imperative that you return to your primary care physician (or establish a relationship with a primary care physician if you do not have one) for your aftercare needs so that they can reassess your need for medications and monitor your lab values.

## 2015-12-21 NOTE — Progress Notes (Signed)
Patient is alert and oriented, vss, no complaints of pain.  Being discharged with [facility] PT and RN.  No questions.  F/U appts have been made.  No new RX.   Patient to be escorted out of hospital via wheelchair by volunteers.

## 2015-12-21 NOTE — Care Management Note (Signed)
Case Management Note  Patient Details  Name: Gary Grant MRN: [id number] Date of Birth: 03-13-1923  Subjective/Objective:          Grant referral for home health PT and RN was texted to [facility]. Gary Grant reviewed Gary Grant address in [place] and reports that it is within [place] area.          Action/Plan:   Expected Discharge Date:                  Expected Discharge Plan:     In-House Referral:     Discharge planning Services     Post Acute Care Choice:    Choice offered to:     DME Arranged:    DME Agency:     HH Arranged:    HH Agency:     Status of Service:     If discussed at [employer] of Stay Meetings, dates discussed:    Additional Comments:  Gary Rinck A, RN 12/21/2015, 10:49 AM

## 2015-12-21 NOTE — Progress Notes (Signed)
[facility] Encounter Note  Patient: Gary Grant / Admit Date: 12/19/2015 / Date of Encounter: 12/21/2015, 12:49 PM   Subjective: Patient feeling much better today with less chest pain and/or shortness of breath. Fully recovered Review of Systems: Positive for: Shortness of breath and limb pain Negative for: Vision change, hearing change, syncope, dizziness, nausea, vomiting,diarrhea, bloody stool, stomach pain, cough, congestion, diaphoresis, urinary frequency, urinary pain,skin lesions, skin rashes Others previously listed  Objective: Telemetry: Sinus bradycardia Physical Exam: Blood pressure (!) 141/69, pulse (!) 111, temperature 98.4 F (36.9 C), temperature source Oral, resp. rate 18, height 6' (1.829 m), weight 115.7 kg (255 lb), SpO2 96 %. Body mass index is 34.58 kg/m. General: Well developed, well nourished, in no acute distress. Head: Normocephalic, atraumatic, sclera non-icteric, no xanthomas, nares are without discharge. Neck: No apparent masses Lungs: Normal respirations with some wheezes, no rhonchi, no rales , no crackles   Heart: Regular rate and rhythm, normal S1 S2, subtle apical murmur, no rub, no gallop, PMI is normal size and placement, carotid upstroke normal without bruit, jugular venous pressure normal Abdomen: Soft, non-tender, distended with normoactive bowel sounds. No hepatosplenomegaly. Abdominal aorta is normal size without bruit Extremities: 1+ edema, no clubbing, no cyanosis, no ulcers,  Peripheral: 2+ radial, 2+ femoral, 1 + dorsal pedal pulses Neuro: Alert and oriented. Moves all extremities spontaneously. Psych:  Responds to questions appropriately with a normal affect.   Intake/Output Summary (Last 24 hours) at 12/21/15 1249 Last data filed at 12/21/15 0948  Gross per 24 hour  Intake              840 ml  Output             2025 ml  Net            -1185 ml    Inpatient Medications:  . allopurinol  100 mg Oral Daily   . aspirin EC  81 mg Oral Daily  . atorvastatin  20 mg Oral QHS  . cholecalciferol  2,000 Units Oral Daily  . enalapril  5 mg Oral Daily  . enoxaparin (LOVENOX) injection  115 mg Subcutaneous Q12H  . ipratropium-albuterol  3 mL Nebulization TID  . levothyroxine  75 mcg Oral QAC breakfast  . metoprolol succinate  12.5 mg Oral QHS  . multivitamin with minerals  1 tablet Oral Daily  . pantoprazole  40 mg Oral Daily  . sodium chloride flush  3 mL Intravenous Q12H  . tamsulosin  0.4 mg Oral Daily   Infusions:    Labs:  Recent Labs  12/19/15 0837 12/21/15 0400  NA 139 140  K 4.1 4.5  CL 103 103  CO2 27 29  GLUCOSE 91 106*  BUN 38* 38*  CREATININE 2.09* 2.26*  CALCIUM 8.5* 8.5*   No results for input(s): AST, ALT, ALKPHOS, BILITOT, PROT, ALBUMIN in the last 72 hours.  Recent Labs  12/19/15 0837 12/21/15 0400  WBC 6.3 8.0  NEUTROABS 4.7  --   HGB 9.7* 9.7*  HCT 29.5* 30.2*  MCV 92.0 93.2  PLT 207 192    Recent Labs  12/19/15 0837 12/19/15 1437 12/19/15 2028 12/20/15 0122  TROPONINI 0.12* 1.18* 1.00* 0.72*   Invalid input(s): POCBNP No results for input(s): HGBA1C in the last 72 hours.   Weights: Filed Weights   12/19/15 0834 12/19/15 1248  Weight: 120.2 kg (265 lb) 115.7 kg (255 lb)     Radiology/Studies:  Dg Chest 1 View  Result Date:  12/19/2015 CLINICAL DATA:  New onset of shortness of breath starting last night EXAM: CHEST 1 VIEW COMPARISON:  11/26/2015 FINDINGS: Cardiomediastinal silhouette is stable. Status post CABG. No infiltrate or pleural effusion. Mild elevation of the left hemidiaphragm. Mild basilar atelectasis. IMPRESSION: Status post CABG. Mild elevation of the left hemidiaphragm with left basilar atelectasis. No pulmonary edema. Electronically Signed   By: Lahoma Crocker M.D.   On: 12/19/2015 09:20   Dg Chest 2 View  Result Date: 11/26/2015 CLINICAL DATA:  Weakness, cough and shortness of breath. EXAM: CHEST  2 VIEW COMPARISON:  Chest  radiograph 05/05/2015 FINDINGS: Median sternotomy wires are again seen. Cardiomediastinal contours are unchanged. No pneumothorax or sizable pleural effusion. Mild opacities in the left lung base appear unchanged. No focal consolidation or pulmonary edema. IMPRESSION: No active cardiopulmonary disease. Electronically Signed   By: Ulyses Jarred M.D.   On: 11/26/2015 17:09   Mr Thoracic Spine Wo Contrast  Result Date: 11/28/2015 CLINICAL DATA:  Thoracic back pain. New onset of lower extremity weakness. Multiple falls. EXAM: MRI THORACIC SPINE WITHOUT CONTRAST TECHNIQUE: Multiplanar, multisequence MR imaging of the thoracic spine was performed. No intravenous contrast was administered. COMPARISON:  Lumbar MRI 11/27/2015, cervical spine CT 04/19/2013 and chest CT 03/16/2010. FINDINGS: Alignment: The thoracic alignment is normal. Sagittal localizing images of the cervical spine demonstrate stable mild spondylosis and straightening. Vertebrae: No acute or suspicious osseous findings. There is a prominent Schmorl's node involving the inferior endplate of T9. Anterior osteophytes are most prominent at this level. Cord:  The thoracic cord appears normal in signal and caliber. Paraspinal and other soft tissues: No significant paraspinal abnormalities. Disc levels: Mild degenerative changes are present throughout the thoracic spine. There are paraspinal osteophytes, most prominent at T9-10. No disc herniation, spinal stenosis or nerve root encroachment is present. IMPRESSION: 1. No acute findings. No cord deformity, spinal stenosis or nerve root encroachment. 2. Endplate osteophytes throughout the thoracic spine with prominent Schmorl's node involving the inferior endplate of T9. Electronically Signed   By: Richardean Sale M.D.   On: 11/28/2015 15:05   Mr Lumbar Spine Wo Contrast  Result Date: 11/27/2015 CLINICAL DATA:  New onset lower extremity weakness.  Multiple falls. EXAM: MRI LUMBAR SPINE WITHOUT CONTRAST  TECHNIQUE: Multiplanar, multisequence MR imaging of the lumbar spine was performed. No intravenous contrast was administered. COMPARISON:  None. FINDINGS: Segmentation: Normal. The lowest disc space is considered to be L5-S1. Alignment:  Normal Vertebrae: No acute compression fracture, facet edema or focal marrow lesion. Conus medullaris: Extends to the L1 level and appears normal. Paraspinal and other soft tissues: The visualized aorta, IVC and iliac vessels are normal. The visualized retroperitoneal organs and paraspinal soft tissues are normal. Disc levels: T11-T12: Evaluated on sagittal images only. No significant disc herniation, spinal canal stenosis or neuroforaminal narrowing. T12-L1:  Normal L1-L2: Normal disc space and facet joints. No spinal canal stenosis. No neuroforaminal stenosis. L2-L3: Small diffuse disc bulge and bilateral moderate facet hypertrophy. Prominent Schmorl's node within the inferior endplate of L2. No spinal canal stenosis. Mild left neuroforaminal stenosis. L3-L4: Mild diffuse disc bulge and moderate bilateral facet hypertrophy. No spinal canal stenosis. No neuroforaminal stenosis. L4-L5: Moderate diffuse disc bulge with mild narrowing of both lateral recesses. Moderate facet hypertrophy. No spinal canal stenosis. Mild bilateral neuroforaminal stenosis. L5-S1: Mild disc bulge, right eccentric. No spinal canal stenosis. Moderate right and mild left neuroforaminal stenosis. IMPRESSION: 1. Multilevel degenerative disc disease and facet arthrosis with mild-to-moderate neural foraminal narrowing, greatest at right  L5-S1. 2. No spinal canal stenosis. 3. Mild bilateral lateral recess narrowing at L4-L5. Correlate for symptoms of L5 radiculopathy. Electronically Signed   By: Ulyses Jarred M.D.   On: 11/27/2015 19:36     Assessment and Recommendation  81 y.o. male with known paroxysmal nonvalvular atrial fibrillation with rapid ventricular rate now spontaneously converted to normal sinus  rhythm likely exacerbated by acute non-ST elevation myocardial infarction and coronary artery disease now improved with less symptoms. Patient does have chronic kidney disease stage III and hypothyroidism possibly also contributing 1. Continue metoprolol for heart rate control and maintenance of normal sinus rhythm as well as treatment of non-ST elevation myocardial infarction 2. No further intervention at this time 3. Continue high intensity cholesterol therapy for coronary artery disease and non-ST elevation myocardial infarction 4. Furosemide for concerns of heart failure or pulmonary edema and lower extremity edema 5. Continue ACE inhibitor for hypertension control and coronary artery disease with myocardial infarction 6. Cardiac rehabilitation has been discussed and will start nest week 7. No further cardiac diagnostics necessary at this time and watch for any further significant symptoms with ambulation and ok for dc from cardiac standpoint  Signed, Serafina Royals M.D. FACC

## 2016-01-04 ENCOUNTER — Encounter: Payer: Self-pay | Admitting: Occupational Medicine

## 2016-01-04 ENCOUNTER — Emergency Department
Admission: EM | Admit: 2016-01-04 | Discharge: 2016-01-05 | Disposition: A | Discharge status: Home / Self Care | Payer: Medicare HMO | Attending: Emergency Medicine | Admitting: Emergency Medicine

## 2016-01-04 DIAGNOSIS — N189 Chronic kidney disease, unspecified: Secondary | ICD-10-CM | POA: Insufficient documentation

## 2016-01-04 DIAGNOSIS — J449 Chronic obstructive pulmonary disease, unspecified: Secondary | ICD-10-CM | POA: Insufficient documentation

## 2016-01-04 DIAGNOSIS — Z7982 Long term (current) use of aspirin: Secondary | ICD-10-CM | POA: Diagnosis not present

## 2016-01-04 DIAGNOSIS — I482 Chronic atrial fibrillation, unspecified: Secondary | ICD-10-CM

## 2016-01-04 DIAGNOSIS — I2510 Atherosclerotic heart disease of native coronary artery without angina pectoris: Secondary | ICD-10-CM | POA: Insufficient documentation

## 2016-01-04 DIAGNOSIS — E039 Hypothyroidism, unspecified: Secondary | ICD-10-CM | POA: Insufficient documentation

## 2016-01-04 DIAGNOSIS — I471 Supraventricular tachycardia: Secondary | ICD-10-CM | POA: Diagnosis not present

## 2016-01-04 DIAGNOSIS — Z79899 Other long term (current) drug therapy: Secondary | ICD-10-CM | POA: Insufficient documentation

## 2016-01-04 DIAGNOSIS — Z87891 Personal history of nicotine dependence: Secondary | ICD-10-CM | POA: Insufficient documentation

## 2016-01-04 DIAGNOSIS — I129 Hypertensive chronic kidney disease with stage 1 through stage 4 chronic kidney disease, or unspecified chronic kidney disease: Secondary | ICD-10-CM | POA: Insufficient documentation

## 2016-01-04 DIAGNOSIS — R000 Tachycardia, unspecified: Secondary | ICD-10-CM | POA: Diagnosis present

## 2016-01-04 DIAGNOSIS — Z8546 Personal history of malignant neoplasm of prostate: Secondary | ICD-10-CM | POA: Diagnosis not present

## 2016-01-04 HISTORY — DX: Acute myocardial infarction, unspecified: I21.9

## 2016-01-04 LAB — CBC WITH DIFFERENTIAL/PLATELET
BASOS ABS: 0 10*3/uL (ref 0–0.1)
BASOS PCT: 1 %
Eosinophils Absolute: 0.4 10*3/uL (ref 0–0.7)
Eosinophils Relative: 5 %
HEMATOCRIT: 33 % — AB (ref 40.0–52.0)
HEMOGLOBIN: 10.3 g/dL — AB (ref 13.0–18.0)
LYMPHS PCT: 18 %
Lymphs Abs: 1.4 10*3/uL (ref 1.0–3.6)
MCH: 29.6 pg (ref 26.0–34.0)
MCHC: 31.2 g/dL — ABNORMAL LOW (ref 32.0–36.0)
MCV: 94.7 fL (ref 80.0–100.0)
Monocytes Absolute: 0.9 10*3/uL (ref 0.2–1.0)
Monocytes Relative: 12 %
NEUTROS ABS: 4.8 10*3/uL (ref 1.4–6.5)
NEUTROS PCT: 64 %
Platelets: 195 10*3/uL (ref 150–440)
RBC: 3.48 MIL/uL — ABNORMAL LOW (ref 4.40–5.90)
RDW: 17.5 % — ABNORMAL HIGH (ref 11.5–14.5)
WBC: 7.4 10*3/uL (ref 3.8–10.6)

## 2016-01-04 LAB — COMPREHENSIVE METABOLIC PANEL
ALBUMIN: 3.3 g/dL — AB (ref 3.5–5.0)
ALK PHOS: 65 U/L (ref 38–126)
ALT: 22 U/L (ref 17–63)
AST: 21 U/L (ref 15–41)
Anion gap: 7 (ref 5–15)
BILIRUBIN TOTAL: 0.5 mg/dL (ref 0.3–1.2)
BUN: 48 mg/dL — AB (ref 6–20)
CALCIUM: 8.7 mg/dL — AB (ref 8.9–10.3)
CO2: 29 mmol/L (ref 22–32)
CREATININE: 2.42 mg/dL — AB (ref 0.61–1.24)
Chloride: 106 mmol/L (ref 101–111)
GFR calc Af Amer: 26 mL/min — ABNORMAL LOW (ref >60)
GFR, EST NON AFRICAN AMERICAN: 22 mL/min — AB (ref >60)
GLUCOSE: 109 mg/dL — AB (ref 65–99)
Potassium: 3.7 mmol/L (ref 3.5–5.1)
Sodium: 142 mmol/L (ref 135–145)
TOTAL PROTEIN: 6.6 g/dL (ref 6.5–8.1)

## 2016-01-04 LAB — MAGNESIUM: Magnesium: 2.1 mg/dL (ref 1.7–2.4)

## 2016-01-04 LAB — TROPONIN I: Troponin I: <0.03 ng/mL (ref <0.03)

## 2016-01-04 MED ORDER — SODIUM CHLORIDE 0.9 % IV BOLUS (SEPSIS)
250.0000 mL | INTRAVENOUS | Status: AC
  Administered 2016-01-04: 250 mL via INTRAVENOUS

## 2016-01-04 MED ORDER — METOPROLOL TARTRATE 25 MG PO TABS
25.0000 mg | ORAL_TABLET | Freq: Once | ORAL | Status: AC
Start: 2016-01-04 — End: 2016-01-04
  Administered 2016-01-04: 25 mg via ORAL
  Filled 2016-01-04: qty 1

## 2016-01-04 NOTE — ED Notes (Signed)
ED Provider at bedside. 

## 2016-01-04 NOTE — ED Provider Notes (Signed)
[facility] Emergency Department Provider Note  ____________________________________________   First MD Initiated Contact with Patient 01/04/16 2008     (approximate)  I have reviewed the triage vital signs and the nursing notes.   HISTORY  Chief Complaint Tachycardia; Shortness of Breath; and Hot Flashes    HPI Gary Grant is a 82 y.o. male who was recently admitted to the hospital (about 2 weeks ago) for an episode of tachycardia that was apparently atrial fibrillation with RVR.  He presents by EMS today for evaluation of acute onset feeling hot, short of breath, and racing heart palpitations.  EMS reports that his  Heart rate was in the 200s and they gave him adenosine 6 mg followed by adenosine 12 mg, and that converted him back to normal sinus rhythm in the 80s.  His symptoms resolved after getting the 12 mg of adenosine and converted back to a regular rhythm.  He states he feels back to baseline at this time.  His heart rate is somewhat irregular on the monitor and on his EKG although it does appear to be sinus rhythm although it is possible it is a flutter with variable conduction but there are visible P waves.  The patient is followed by Dr. Nehemiah Massed and saw him about one week ago.  He takes metoprolol succinate 12.5 mg IV in the evenings and has not had his dose yet tonight.  He currently denies chest pain, shortness of breath, nausea, vomiting, diarrhea, dysuria.  Even though he has a diagnosis of paroxysmal atrial fibrillation he is not on anticoagulation as per cardiology because of the concerns for fall risk.  Past Medical History:  Diagnosis Date  . CAD (coronary artery disease)   . Chronic a-fib ([place])   . CKD (chronic kidney disease)   . COPD (chronic obstructive pulmonary disease) ([place])   . GERD (gastroesophageal reflux disease)   . HTN (hypertension)   . Hypothyroidism   . MI (myocardial infarction)   . PAD (peripheral artery disease)  ([place])   . Prostate cancer ([place])   . SSS (sick sinus syndrome) ([place])   . Venous insufficiency     Patient Active Problem List   Diagnosis Date Noted  . NSTEMI (non-ST elevated myocardial infarction) ([place]) 12/20/2015  . Atrial fibrillation, rapid ([place]) 12/19/2015  . Leg weakness, bilateral 11/29/2015  . Falls 11/29/2015  . Degenerative disc disease, lumbar 11/29/2015  . Back pain 11/29/2015  . Bradycardia, drug induced 11/29/2015  . Dehydration 11/29/2015  . Anemia 11/29/2015  . Hypothyroidism 11/29/2015  . Weakness 11/26/2015  . Nephrolithiasis 05/07/2015  . Constipation 05/07/2015    Past Surgical History:  Procedure Laterality Date  . CARDIAC SURGERY     bypass  . EYE SURGERY    . FRACTURE SURGERY      Prior to Admission medications   Medication Sig Start Date End Date Taking? Authorizing Provider  acetaminophen (TYLENOL) 325 MG tablet Take 325-650 mg by mouth every 6 (six) hours as needed for moderate pain or fever.   Yes Historical Provider, MD  allopurinol (ZYLOPRIM) 100 MG tablet Take 100 mg by mouth daily.   Yes Historical Provider, MD  aspirin EC 81 MG tablet Take 81 mg by mouth daily.   Yes Historical Provider, MD  atorvastatin (LIPITOR) 20 MG tablet Take 20 mg by mouth at bedtime.    Yes Historical Provider, MD  Cholecalciferol 2000 units TABS Take 2,000 Units by mouth daily.   Yes Historical Provider, MD  enalapril (VASOTEC) 5 MG tablet Take 5 mg by mouth daily.   Yes Historical Provider, MD  levothyroxine (SYNTHROID, LEVOTHROID) 75 MCG tablet Take 75 mcg by mouth daily before breakfast.   Yes Historical Provider, MD  metoprolol succinate (TOPROL-XL) 25 MG 24 hr tablet Take 12.5 mg by mouth at bedtime.   Yes Historical Provider, MD  Multiple Vitamins-Minerals (MULTIVITAMIN ADULTS 50+) TABS Take 1 tablet by mouth daily.   Yes Historical Provider, MD  pantoprazole (PROTONIX) 40 MG tablet Take 1 tablet (40 mg total) by mouth 2 (two) times daily before a meal. Patient  taking differently: Take 40 mg by mouth daily as needed. For acid reflux 05/07/15  Yes Theodoro Grist, MD  tamsulosin (FLOMAX) 0.4 MG CAPS capsule Take 0.4 mg by mouth daily.   Yes Historical Provider, MD  Levothyroxine Sodium 50 MCG CAPS Take 1 capsule (50 mcg total) by mouth daily before breakfast. Patient not taking: Reported on 01/04/2016 11/29/15   Theodoro Grist, MD    Allergies Patient has no known allergies.  Family History  Problem Relation Age of Onset  . Osteoporosis Mother   . Heart Problems Father   . Stroke Maternal Grandfather   . Stroke Paternal Grandfather   . Stroke Paternal Grandmother     Social History Social History  Substance Use Topics  . Smoking status: Former Research scientist (life sciences)  . Smokeless tobacco: Never Used  . Alcohol use No    Review of Systems Constitutional: No fever/chills Eyes: No visual changes. ENT: No sore throat. Cardiovascular: Palpitations, chest tightness while tachycardiac Respiratory: shortness of breath when having palpitations Gastrointestinal: No abdominal pain.  No nausea, no vomiting.  No diarrhea.  No constipation. Genitourinary: Negative for dysuria. Musculoskeletal: Negative for back pain. Skin: Negative for rash. Neurological: Negative for headaches, focal weakness or numbness.  10-point ROS otherwise negative.  ____________________________________________   PHYSICAL EXAM:  ED Triage Vitals  Enc Vitals Group     BP 01/04/16 1956 (!) 93/59     Pulse Rate 01/04/16 1956 89     Resp 01/04/16 1956 18     Temp 01/04/16 1956 98 F (36.7 C)     Temp src --      SpO2 01/04/16 1955 96 %     Weight 01/04/16 2021 278 lb 12.8 oz (126.5 kg)     Height 01/04/16 2021 5\' 11"  (1.803 m)     Head Circumference --      Peak Flow --      Pain Score 01/04/16 1957 0     Pain Loc --      Pain Edu? --      Excl. in [place]? --      Constitutional: Alert and oriented. Well appearing and in no acute distress. Eyes: Conjunctivae are normal. PERRL.  EOMI. Head: Atraumatic. Nose: No congestion/rhinnorhea. Mouth/Throat: Mucous membranes are moist.  Oropharynx non-erythematous. Neck: No stridor.  No meningeal signs.   Cardiovascular: Normal rate, regular rhythm on palpation and auscultation. Good peripheral circulation. Grossly normal heart sounds. Respiratory: Normal respiratory effort.  No retractions. Lungs CTAB. Gastrointestinal: Soft and nontender. No distention.  Musculoskeletal: 1+ pitting lower extremity edema.  No tenderness. No gross deformities of extremities. Neurologic:  Normal speech and language. No gross focal neurologic deficits are appreciated.  Skin:  Skin is warm, dry and intact. No rash noted. Psychiatric: Mood and affect are normal. Speech and behavior are normal.  ____________________________________________   LABS (all labs ordered are listed, but only abnormal results are displayed)  Labs Reviewed  CBC WITH DIFFERENTIAL/PLATELET - Abnormal; Notable for the following:       Result Value   RBC 3.48 (*)    Hemoglobin 10.3 (*)    HCT 33.0 (*)    MCHC 31.2 (*)    RDW 17.5 (*)    All other components within normal limits  COMPREHENSIVE METABOLIC PANEL - Abnormal; Notable for the following:    Glucose, Bld 109 (*)    BUN 48 (*)    Creatinine, Ser 2.42 (*)    Calcium 8.7 (*)    Albumin 3.3 (*)    GFR calc non Af Amer 22 (*)    GFR calc Af Amer 26 (*)    All other components within normal limits  MAGNESIUM  TROPONIN I   ____________________________________________  EKG  ED ECG REPORT I, Amairani Shuey, the attending physician, personally viewed and interpreted this ECG.  Date: 01/04/2016 EKG Time: 20:06 Rate: 104 Rhythm: Appears to be sinus tachycardia but underlying a flutter with variable conduction is also possible QRS Axis: normal Intervals: normal ST/T Wave abnormalities: normal Conduction Disturbances: none No evidence of acute  ischemia  ____________________________________________  RADIOLOGY   No results found.  ____________________________________________   PROCEDURES  Procedure(s) performed:   Procedures   Critical Care performed: No ____________________________________________   INITIAL IMPRESSION / ASSESSMENT AND PLAN / ED COURSE  Pertinent labs & imaging results that were available during my care of the patient were reviewed by me and considered in my medical decision making (see chart for details).  The patient appears to have been in SVT by EMS because of the rapid rate and the fact that the SVT converted him to a normal rate.  His rhythm is somewhat nonspecific at this point; he is either in sinus rhythm with sinus arrhythmia or there are times it appears that there may be an underlying a flutter with variable conduction.  Regardless he does have a known diagnosis of atrial relation and has a close relationship with cardiology (Dr. Nehemiah Massed).  We will keep him on the monitor and check labs.  I am giving him metoprolol tartrate 25 mg given the episode that he had an effect he has not had his nighttime dose of medication yet.   Clinical Course as of Jan 04 2351  Tue Jan 04, 2016  2157 The patient is asymptomatic at this time.  His blood pressure is a little bit low but it has been consistent and stable.  His heart rate is normal (has been in the 80s and 90s and was when I just now walked out of the room); I am not sure why he has consistent heart rate measurements and his vital signs in the 40s because he has been in the 80s and 90s whenever I have checked on him.  I told her I would like to observe him for a little while longer to make sure his blood pressure and heart rate stays stable and he is comfortable with that plan.  At this time I do not believe he will require medical admission, and he and his family are comfortable with that plan.  [CF]  2346 The patient has been stable throughout his ED  stay.  He and his family are comfortable with the plan to go home.  I gave my usual and customary return precautions.   [CF]    Clinical Course User Index [CF] Hinda Kehr, MD    ____________________________________________  FINAL CLINICAL IMPRESSION(S) / ED DIAGNOSES  Final  diagnoses:  SVT (supraventricular tachycardia) (HCC)  Chronic atrial fibrillation ([place])     MEDICATIONS GIVEN DURING THIS VISIT:  Medications  sodium chloride 0.9 % bolus 250 mL (0 mLs Intravenous Stopped 01/04/16 2243)  metoprolol tartrate (LOPRESSOR) tablet 25 mg (25 mg Oral Given 01/04/16 2047)     NEW OUTPATIENT MEDICATIONS STARTED DURING THIS VISIT:  New Prescriptions   No medications on file    Modified Medications   No medications on file    Discontinued Medications   No medications on file     Note:  This document was prepared using Dragon voice recognition software and may include unintentional dictation errors.    Hinda Kehr, MD 01/04/16 2352

## 2016-01-04 NOTE — Discharge Instructions (Signed)
As we discussed, your signs and symptoms strongly suggest a condition called paroxysmal supraventricular tachycardia (PSVT).  This is a generally benign condition, but we recommend you follow up with the listed cardiologist for further evaluation.  Please read through the included information and try some of the techniques we discussed the next time you have the symptoms. ° °Return to the Emergency Department if you develop new or worsening symptoms that concern you. °

## 2016-01-04 NOTE — ED Triage Notes (Signed)
Pt presents via ems from home pt watching tv got hot sob and felt paplations. EMS reports hr in 200 gave adenosine 6mg  at 1927 then 12mg  at 1937 and convert to NSR in 80s. Once concerted no hot flash no sob no cp. Pt feels back to normal. Hr on arrival 89-105 irregular EKG sinus tacy with irregular rate. Appears Afib on monitor. Pt hx of mi 3 weeks ago. Hx afib.

## 2016-01-05 NOTE — ED Notes (Signed)
Discharge instructions reviewed with patient. Questions fielded by this RN. Patient verbalizes understanding of instructions. Patient discharged home in stable condition per Karma Greaser MD . No acute distress noted at time of discharge.

## 2017-04-17 ENCOUNTER — Emergency Department: Payer: Medicare HMO

## 2017-04-17 ENCOUNTER — Inpatient Hospital Stay
Admission: EM | Admit: 2017-04-17 | Discharge: 2017-04-22 | DRG: 308 | Disposition: A | Discharge status: Home Health | Payer: Medicare HMO | Attending: Internal Medicine | Admitting: Internal Medicine

## 2017-04-17 ENCOUNTER — Inpatient Hospital Stay
Admit: 2017-04-17 | Discharge: 2017-04-17 | Disposition: A | Discharge status: Home / Self Care | Payer: Medicare HMO | Attending: Internal Medicine | Admitting: Internal Medicine

## 2017-04-17 ENCOUNTER — Other Ambulatory Visit: Payer: Self-pay

## 2017-04-17 ENCOUNTER — Encounter: Payer: Self-pay | Admitting: Emergency Medicine

## 2017-04-17 DIAGNOSIS — R609 Edema, unspecified: Secondary | ICD-10-CM | POA: Diagnosis present

## 2017-04-17 DIAGNOSIS — E860 Dehydration: Secondary | ICD-10-CM | POA: Diagnosis present

## 2017-04-17 DIAGNOSIS — T462X5A Adverse effect of other antidysrhythmic drugs, initial encounter: Secondary | ICD-10-CM | POA: Diagnosis not present

## 2017-04-17 DIAGNOSIS — E039 Hypothyroidism, unspecified: Secondary | ICD-10-CM | POA: Diagnosis present

## 2017-04-17 DIAGNOSIS — I5033 Acute on chronic diastolic (congestive) heart failure: Secondary | ICD-10-CM | POA: Diagnosis present

## 2017-04-17 DIAGNOSIS — I2510 Atherosclerotic heart disease of native coronary artery without angina pectoris: Secondary | ICD-10-CM | POA: Diagnosis present

## 2017-04-17 DIAGNOSIS — I739 Peripheral vascular disease, unspecified: Secondary | ICD-10-CM | POA: Diagnosis present

## 2017-04-17 DIAGNOSIS — I4892 Unspecified atrial flutter: Principal | ICD-10-CM | POA: Diagnosis present

## 2017-04-17 DIAGNOSIS — Z823 Family history of stroke: Secondary | ICD-10-CM

## 2017-04-17 DIAGNOSIS — Y92230 Patient room in hospital as the place of occurrence of the external cause: Secondary | ICD-10-CM | POA: Diagnosis not present

## 2017-04-17 DIAGNOSIS — E785 Hyperlipidemia, unspecified: Secondary | ICD-10-CM | POA: Diagnosis present

## 2017-04-17 DIAGNOSIS — D649 Anemia, unspecified: Secondary | ICD-10-CM | POA: Diagnosis present

## 2017-04-17 DIAGNOSIS — I252 Old myocardial infarction: Secondary | ICD-10-CM

## 2017-04-17 DIAGNOSIS — I482 Chronic atrial fibrillation: Secondary | ICD-10-CM | POA: Diagnosis present

## 2017-04-17 DIAGNOSIS — Z9181 History of falling: Secondary | ICD-10-CM

## 2017-04-17 DIAGNOSIS — Z87442 Personal history of urinary calculi: Secondary | ICD-10-CM

## 2017-04-17 DIAGNOSIS — I130 Hypertensive heart and chronic kidney disease with heart failure and stage 1 through stage 4 chronic kidney disease, or unspecified chronic kidney disease: Secondary | ICD-10-CM | POA: Diagnosis present

## 2017-04-17 DIAGNOSIS — Z7982 Long term (current) use of aspirin: Secondary | ICD-10-CM

## 2017-04-17 DIAGNOSIS — I248 Other forms of acute ischemic heart disease: Secondary | ICD-10-CM | POA: Diagnosis present

## 2017-04-17 DIAGNOSIS — Z8546 Personal history of malignant neoplasm of prostate: Secondary | ICD-10-CM | POA: Diagnosis not present

## 2017-04-17 DIAGNOSIS — Z66 Do not resuscitate: Secondary | ICD-10-CM | POA: Diagnosis present

## 2017-04-17 DIAGNOSIS — Z79890 Hormone replacement therapy (postmenopausal): Secondary | ICD-10-CM

## 2017-04-17 DIAGNOSIS — I4891 Unspecified atrial fibrillation: Secondary | ICD-10-CM | POA: Diagnosis present

## 2017-04-17 DIAGNOSIS — Z87891 Personal history of nicotine dependence: Secondary | ICD-10-CM | POA: Diagnosis not present

## 2017-04-17 DIAGNOSIS — J449 Chronic obstructive pulmonary disease, unspecified: Secondary | ICD-10-CM | POA: Diagnosis present

## 2017-04-17 DIAGNOSIS — I872 Venous insufficiency (chronic) (peripheral): Secondary | ICD-10-CM | POA: Diagnosis present

## 2017-04-17 DIAGNOSIS — Z8262 Family history of osteoporosis: Secondary | ICD-10-CM | POA: Diagnosis not present

## 2017-04-17 DIAGNOSIS — I495 Sick sinus syndrome: Secondary | ICD-10-CM | POA: Diagnosis present

## 2017-04-17 DIAGNOSIS — K219 Gastro-esophageal reflux disease without esophagitis: Secondary | ICD-10-CM | POA: Diagnosis present

## 2017-04-17 DIAGNOSIS — N184 Chronic kidney disease, stage 4 (severe): Secondary | ICD-10-CM | POA: Diagnosis present

## 2017-04-17 DIAGNOSIS — E669 Obesity, unspecified: Secondary | ICD-10-CM | POA: Diagnosis present

## 2017-04-17 DIAGNOSIS — Z6836 Body mass index (BMI) 36.0-36.9, adult: Secondary | ICD-10-CM

## 2017-04-17 DIAGNOSIS — N179 Acute kidney failure, unspecified: Secondary | ICD-10-CM | POA: Diagnosis present

## 2017-04-17 DIAGNOSIS — Z79899 Other long term (current) drug therapy: Secondary | ICD-10-CM

## 2017-04-17 DIAGNOSIS — R0600 Dyspnea, unspecified: Secondary | ICD-10-CM

## 2017-04-17 LAB — BASIC METABOLIC PANEL
ANION GAP: 9 (ref 5–15)
BUN: 42 mg/dL — ABNORMAL HIGH (ref 6–20)
CALCIUM: 8.6 mg/dL — AB (ref 8.9–10.3)
CO2: 28 mmol/L (ref 22–32)
Chloride: 106 mmol/L (ref 101–111)
Creatinine, Ser: 2.21 mg/dL — ABNORMAL HIGH (ref 0.61–1.24)
GFR calc Af Amer: 28 mL/min — ABNORMAL LOW (ref >60)
GFR, EST NON AFRICAN AMERICAN: 24 mL/min — AB (ref >60)
Glucose, Bld: 99 mg/dL (ref 65–99)
POTASSIUM: 4 mmol/L (ref 3.5–5.1)
SODIUM: 143 mmol/L (ref 135–145)

## 2017-04-17 LAB — CBC
HCT: 33.5 % — ABNORMAL LOW (ref 40.0–52.0)
HEMOGLOBIN: 11.2 g/dL — AB (ref 13.0–18.0)
MCH: 32.1 pg (ref 26.0–34.0)
MCHC: 33.3 g/dL (ref 32.0–36.0)
MCV: 96.4 fL (ref 80.0–100.0)
Platelets: 230 10*3/uL (ref 150–440)
RBC: 3.48 MIL/uL — ABNORMAL LOW (ref 4.40–5.90)
RDW: 14.9 % — AB (ref 11.5–14.5)
WBC: 8.6 10*3/uL (ref 3.8–10.6)

## 2017-04-17 LAB — BRAIN NATRIURETIC PEPTIDE: B NATRIURETIC PEPTIDE 5: 417 pg/mL — AB (ref 0.0–100.0)

## 2017-04-17 LAB — TROPONIN I: TROPONIN I: 0.04 ng/mL — AB (ref <0.03)

## 2017-04-17 LAB — PROTIME-INR
INR: 1.06
PROTHROMBIN TIME: 13.7 s (ref 11.4–15.2)

## 2017-04-17 LAB — APTT: aPTT: 29 seconds (ref 24–36)

## 2017-04-17 LAB — MAGNESIUM: Magnesium: 1.9 mg/dL (ref 1.7–2.4)

## 2017-04-17 MED ORDER — VITAMIN D 1000 UNITS PO TABS
1000.0000 [IU] | ORAL_TABLET | Freq: Every day | ORAL | Status: DC
  Administered 2017-04-18 – 2017-04-22 (×5): 1000 [IU] via ORAL
  Filled 2017-04-17 (×5): qty 1

## 2017-04-17 MED ORDER — ALBUTEROL SULFATE (2.5 MG/3ML) 0.083% IN NEBU: 2.5000 mg | INHALATION_SOLUTION | RESPIRATORY_TRACT | Status: DC | PRN

## 2017-04-17 MED ORDER — DEXTROSE 5 % IV SOLN: 5.0000 mg/h | Freq: Once | INTRAVENOUS | Status: DC

## 2017-04-17 MED ORDER — SODIUM CHLORIDE 0.9 % IV SOLN: 250.0000 mL | INTRAVENOUS | Status: DC | PRN

## 2017-04-17 MED ORDER — DIGOXIN 0.25 MG/ML IJ SOLN
0.1250 mg | INTRAMUSCULAR | Status: AC
  Administered 2017-04-18: 0.125 mg via INTRAVENOUS
  Filled 2017-04-17: qty 0.5

## 2017-04-17 MED ORDER — ACETAMINOPHEN 325 MG PO TABS: 650.0000 mg | ORAL_TABLET | Freq: Four times a day (QID) | ORAL | Status: DC | PRN

## 2017-04-17 MED ORDER — DILTIAZEM HCL 30 MG PO TABS
30.0000 mg | ORAL_TABLET | Freq: Once | ORAL | Status: DC
  Filled 2017-04-17: qty 1

## 2017-04-17 MED ORDER — SENNOSIDES-DOCUSATE SODIUM 8.6-50 MG PO TABS: 1.0000 | ORAL_TABLET | Freq: Every evening | ORAL | Status: DC | PRN

## 2017-04-17 MED ORDER — BISACODYL 5 MG PO TBEC
5.0000 mg | DELAYED_RELEASE_TABLET | Freq: Every day | ORAL | Status: DC | PRN
Start: 2017-04-17 — End: 2017-04-22

## 2017-04-17 MED ORDER — ONDANSETRON HCL 4 MG PO TABS: 4.0000 mg | ORAL_TABLET | Freq: Four times a day (QID) | ORAL | Status: DC | PRN

## 2017-04-17 MED ORDER — DILTIAZEM HCL 100 MG IV SOLR
5.0000 mg/h | INTRAVENOUS | Status: DC
  Administered 2017-04-17: 7.5 mg/h via INTRAVENOUS
  Administered 2017-04-17: 15 mg/h via INTRAVENOUS
  Administered 2017-04-17 – 2017-04-18 (×2): 5 mg/h via INTRAVENOUS
  Filled 2017-04-17 (×2): qty 100

## 2017-04-17 MED ORDER — ACETAMINOPHEN 650 MG RE SUPP: 650.0000 mg | Freq: Four times a day (QID) | RECTAL | Status: DC | PRN

## 2017-04-17 MED ORDER — DIGOXIN 0.25 MG/ML IJ SOLN
0.1250 mg | Freq: Once | INTRAMUSCULAR | Status: AC
  Administered 2017-04-17: 0.125 mg via INTRAVENOUS
  Filled 2017-04-17: qty 0.5

## 2017-04-17 MED ORDER — HYDROCODONE-ACETAMINOPHEN 5-325 MG PO TABS: 1.0000 | ORAL_TABLET | ORAL | Status: DC | PRN

## 2017-04-17 MED ORDER — ALLOPURINOL 100 MG PO TABS
200.0000 mg | ORAL_TABLET | Freq: Every day | ORAL | Status: DC
  Administered 2017-04-18 – 2017-04-21 (×4): 200 mg via ORAL
  Filled 2017-04-17 (×6): qty 2

## 2017-04-17 MED ORDER — DILTIAZEM HCL 25 MG/5ML IV SOLN
10.0000 mg | Freq: Once | INTRAVENOUS | Status: AC
  Administered 2017-04-17: 10 mg via INTRAVENOUS
  Filled 2017-04-17: qty 5

## 2017-04-17 MED ORDER — ASPIRIN EC 81 MG PO TBEC
81.0000 mg | DELAYED_RELEASE_TABLET | Freq: Every day | ORAL | Status: DC
  Administered 2017-04-18 – 2017-04-22 (×5): 81 mg via ORAL
  Filled 2017-04-17 (×5): qty 1

## 2017-04-17 MED ORDER — DILTIAZEM HCL 25 MG/5ML IV SOLN
20.0000 mg | Freq: Once | INTRAVENOUS | Status: AC
  Administered 2017-04-17: 20 mg via INTRAVENOUS
  Filled 2017-04-17: qty 5

## 2017-04-17 MED ORDER — SODIUM CHLORIDE 0.9% FLUSH: 3.0000 mL | INTRAVENOUS | Status: DC | PRN

## 2017-04-17 MED ORDER — TAMSULOSIN HCL 0.4 MG PO CAPS
0.4000 mg | ORAL_CAPSULE | Freq: Every day | ORAL | Status: DC
  Administered 2017-04-18 – 2017-04-22 (×5): 0.4 mg via ORAL
  Filled 2017-04-17 (×5): qty 1

## 2017-04-17 MED ORDER — LEVOTHYROXINE SODIUM 50 MCG PO TABS
75.0000 ug | ORAL_TABLET | Freq: Every day | ORAL | Status: DC
  Administered 2017-04-18 – 2017-04-22 (×5): 75 ug via ORAL
  Filled 2017-04-17: qty 2
  Filled 2017-04-17 (×4): qty 1

## 2017-04-17 MED ORDER — SODIUM CHLORIDE 0.9% FLUSH
3.0000 mL | Freq: Two times a day (BID) | INTRAVENOUS | Status: DC
  Administered 2017-04-17 – 2017-04-21 (×8): 3 mL via INTRAVENOUS

## 2017-04-17 MED ORDER — FUROSEMIDE 10 MG/ML IJ SOLN
20.0000 mg | Freq: Once | INTRAMUSCULAR | Status: AC
  Administered 2017-04-17: 20 mg via INTRAVENOUS
  Filled 2017-04-17: qty 4

## 2017-04-17 MED ORDER — HEPARIN SODIUM (PORCINE) 5000 UNIT/ML IJ SOLN
5000.0000 [IU] | Freq: Three times a day (TID) | INTRAMUSCULAR | Status: DC
  Administered 2017-04-17 – 2017-04-22 (×14): 5000 [IU] via SUBCUTANEOUS
  Filled 2017-04-17 (×14): qty 1

## 2017-04-17 MED ORDER — ONDANSETRON HCL 4 MG/2ML IJ SOLN: 4.0000 mg | Freq: Four times a day (QID) | INTRAMUSCULAR | Status: DC | PRN

## 2017-04-17 MED ORDER — ENALAPRIL MALEATE 5 MG PO TABS
5.0000 mg | ORAL_TABLET | Freq: Every day | ORAL | Status: DC
  Filled 2017-04-17: qty 1

## 2017-04-17 MED ORDER — METOPROLOL SUCCINATE 12.5 MG HALF TABLET
12.5000 mg | ORAL_TABLET | Freq: Every day | ORAL | Status: DC
  Administered 2017-04-17: 12.5 mg via ORAL
  Filled 2017-04-17: qty 1

## 2017-04-17 MED ORDER — ATORVASTATIN CALCIUM 20 MG PO TABS
20.0000 mg | ORAL_TABLET | Freq: Every day | ORAL | Status: DC
  Administered 2017-04-17 – 2017-04-21 (×5): 20 mg via ORAL
  Filled 2017-04-17 (×5): qty 1

## 2017-04-17 MED ORDER — FUROSEMIDE 10 MG/ML IJ SOLN
40.0000 mg | Freq: Two times a day (BID) | INTRAMUSCULAR | Status: DC
  Administered 2017-04-17 – 2017-04-19 (×4): 40 mg via INTRAVENOUS
  Filled 2017-04-17 (×4): qty 4

## 2017-04-17 NOTE — ED Notes (Signed)
Held cardizem tab d/t blood pressure; ok per Dr. Archie Balboa.

## 2017-04-17 NOTE — Progress Notes (Signed)
Down for echo

## 2017-04-17 NOTE — ED Notes (Signed)
Patient transported to XR. 

## 2017-04-17 NOTE — Plan of Care (Signed)
  Education: Knowledge of disease or condition will improve 04/17/2017 1843 - Progressing by Darrelyn Hillock, RN Understanding of medication regimen will improve 04/17/2017 1843 - Progressing by Darrelyn Hillock, RN

## 2017-04-17 NOTE — ED Provider Notes (Addendum)
[facility] Emergency Department Provider Note       Time seen: ----------------------------------------- 1:41 PM on 04/17/2017 -----------------------------------------   I have reviewed the triage vital signs and the nursing notes.  HISTORY   Chief Complaint Edema and Cough    HPI Gary Grant is a 84 y.o. male with a history of coronary artery disease, chronic A. fib, chronic kidney disease, COPD, GERD, hypertension, hypothyroidism, MI who presents to the ED for edema and cough for the past 2 weeks.  Patient was seen at the [facility] clinic today for similar and was sent here for further evaluation.  Patient could not tell that his heart was racing.  Patient states he has been dyspneic for a long time and has significant dyspnea on exertion.  His peripheral edema has been present for at least 2 weeks.  Earlier his heart rate was found to be in the 140s.  Past Medical History:  Diagnosis Date  . CAD (coronary artery disease)   . Chronic a-fib ([place])   . CKD (chronic kidney disease)   . COPD (chronic obstructive pulmonary disease) ([place])   . GERD (gastroesophageal reflux disease)   . HTN (hypertension)   . Hypothyroidism   . MI (myocardial infarction) ([place])   . PAD (peripheral artery disease) ([place])   . Prostate cancer ([place])   . SSS (sick sinus syndrome) ([place])   . Venous insufficiency     Patient Active Problem List   Diagnosis Date Noted  . NSTEMI (non-ST elevated myocardial infarction) ([place]) 12/20/2015  . Atrial fibrillation, rapid ([place]) 12/19/2015  . Leg weakness, bilateral 11/29/2015  . Falls 11/29/2015  . Degenerative disc disease, lumbar 11/29/2015  . Back pain 11/29/2015  . Bradycardia, drug induced 11/29/2015  . Dehydration 11/29/2015  . Anemia 11/29/2015  . Hypothyroidism 11/29/2015  . Weakness 11/26/2015  . Nephrolithiasis 05/07/2015  . Constipation 05/07/2015    Past Surgical History:  Procedure Laterality Date  . CARDIAC  SURGERY     bypass  . EYE SURGERY    . FRACTURE SURGERY      Allergies Patient has no known allergies.  Social History Social History   Tobacco Use  . Smoking status: Former Research scientist (life sciences)  . Smokeless tobacco: Never Used  Substance Use Topics  . Alcohol use: No    Alcohol/week: 0.0 oz  . Drug use: No    Review of Systems Constitutional: Negative for fever. Eyes: Negative for vision changes ENT:  Negative for congestion, sore throat Cardiovascular: Negative for chest pain. Respiratory: Positive for shortness of breath Gastrointestinal: Negative for abdominal pain, vomiting and diarrhea. Musculoskeletal: Negative for back pain.  Positive for peripheral edema Skin: Negative for rash. Neurological: Negative for headaches, focal weakness or numbness.  All systems negative/normal/unremarkable except as stated in the HPI  ____________________________________________   PHYSICAL EXAM:  VITAL SIGNS: ED Triage Vitals [04/17/17 1326]  Enc Vitals Group     BP 123/80     Pulse Rate (!) 141     Resp 20     Temp 98.5 F (36.9 C)     Temp Source Oral     SpO2 97 %     Weight 260 lb (117.9 kg)     Height      Head Circumference      Peak Flow      Pain Score      Pain Loc      Pain Edu?      Excl. in [place]?    Constitutional:  Alert and oriented. Well appearing and in no distress. Eyes: Conjunctivae are normal. Normal extraocular movements. ENT   Head: Normocephalic and atraumatic.   Nose: No congestion/rhinnorhea.   Mouth/Throat: Mucous membranes are moist.   Neck: No stridor. Cardiovascular: Rapid rate, regular rhythm. No murmurs, rubs, or gallops. Respiratory: Mild tachypnea with clear breath sounds Gastrointestinal: Soft and nontender. Normal bowel sounds Musculoskeletal: Nontender with normal range of motion in extremities.  Pitting edema to mid tibia bilaterally Neurologic:  Normal speech and language. No gross focal neurologic deficits are appreciated.   Skin:  Skin is warm, dry and intact. No rash noted. Psychiatric: Mood and affect are normal. Speech and behavior are normal.  ____________________________________________  EKG: Interpreted by me.  Likely atrial flutter with a 2-1 AV conduction with a rate of 141 bpm, LVH with QRS widening, long QT, normal axis. Repeat EKG interpreted by me reveals likely atrial flutter with a rate of 139 bpm, wide QRS, long QT, normal axis  After 20 mg of IV Cardizem, 30 EKG reveals atrial flutter, rate is 87 bpm, wide QRS, normal QT ____________________________________________  ED COURSE:  As part of my medical decision making, I reviewed the following data within the [place] History obtained from family if available, nursing notes, old chart and ekg, as well as notes from prior ED visits. Patient presented for cough and peripheral edema, we will assess with labs and imaging as indicated at this time.   Procedures ____________________________________________   LABS (pertinent positives/negatives)  Labs Reviewed  BASIC METABOLIC PANEL - Abnormal; Notable for the following components:      Result Value   BUN 42 (*)    Creatinine, Ser 2.21 (*)    Calcium 8.6 (*)    GFR calc non Af Amer 24 (*)    GFR calc Af Amer 28 (*)    All other components within normal limits  CBC - Abnormal; Notable for the following components:   RBC 3.48 (*)    Hemoglobin 11.2 (*)    HCT 33.5 (*)    RDW 14.9 (*)    All other components within normal limits  TROPONIN I - Abnormal; Notable for the following components:   Troponin I 0.04 (*)    All other components within normal limits  PROTIME-INR  APTT  BRAIN NATRIURETIC PEPTIDE   CRITICAL CARE Performed by: Laurence Aly   Total critical care time: 30 minutes  Critical care time was exclusive of separately billable procedures and treating other patients.  Critical care was necessary to treat or prevent imminent or life-threatening  deterioration.  Critical care was time spent personally by me on the following activities: development of treatment plan with patient and/or surrogate as well as nursing, discussions with consultants, evaluation of patient's response to treatment, examination of patient, obtaining history from patient or surrogate, ordering and performing treatments and interventions, ordering and review of laboratory studies, ordering and review of radiographic studies, pulse oximetry and re-evaluation of patient's condition.  RADIOLOGY Images were viewed by me  Chest x-ray  IMPRESSION: No edema or consolidation. Mild cardiac enlargement. There is aortic atherosclerosis.  Aortic Atherosclerosis (ICD10-I70.0). ____________________________________________  DIFFERENTIAL DIAGNOSIS   Rapid atrial fibrillation, dehydration, electrolyte abnormality, MI, anemia, CHF  FINAL ASSESSMENT AND PLAN  Atrial fibrillation with a rapid ventricular response, dyspnea, peripheral edema   Plan: Patient had presented for dyspnea and peripheral edema. Patient's labs revealed chronic renal insufficiency not significantly changed from prior. Patient's imaging was negative for pulmonary edema.  Patient received IV Cardizem for rate control but remained tachycardic and subsequently after the second dose of Cardizem his heart rate had improved dramatically.  I will give him oral Cardizem for continued rate control and he will be observed.   Earleen Newport, MD   Note: This note was generated in part or whole with voice recognition software. Voice recognition is usually quite accurate but there are transcription errors that can and very often do occur. I apologize for any typographical errors that were not detected and corrected.     Earleen Newport, MD 04/17/17 1507    Earleen Newport, MD 04/17/17 1520

## 2017-04-17 NOTE — H&P (Signed)
[facility] [place] NAME: Gary Grant    MR#:  [id number]  DATE OF BIRTH:  08/18/1923  DATE OF ADMISSION:  04/17/2017  PRIMARY CARE PHYSICIAN: [facility] [place]   REQUESTING/REFERRING PHYSICIAN: Dr. Archie Balboa.  CHIEF COMPLAINT:   Chief Complaint  Patient presents with  . Edema  . Cough   Worsening shortness of breath and leg edema for 2 weeks. HISTORY OF PRESENT ILLNESS:  Gary Grant  is a 84 y.o. male with a known history of multiple medical problems as below.  The patient presented to ED with above chief complaints.  He also complains of orthopnea, nocturnal dyspnea and weight gain for 10 pounds.  He denies any palpitation but he was found A. fib with RVR with heart rate about 130-140s.  He is treated with IV Cardizem 2 doses without improvement, so Cardizem drip is started by ED physician.  PAST MEDICAL HISTORY:   Past Medical History:  Diagnosis Date  . CAD (coronary artery disease)   . Chronic a-fib ([place])   . CKD (chronic kidney disease)   . COPD (chronic obstructive pulmonary disease) ([place])   . GERD (gastroesophageal reflux disease)   . HTN (hypertension)   . Hypothyroidism   . MI (myocardial infarction) ([place])   . PAD (peripheral artery disease) ([place])   . Prostate cancer ([place])   . SSS (sick sinus syndrome) ([place])   . Venous insufficiency     PAST SURGICAL HISTORY:   Past Surgical History:  Procedure Laterality Date  . CARDIAC SURGERY     bypass  . EYE SURGERY    . FRACTURE SURGERY      SOCIAL HISTORY:   Social History   Tobacco Use  . Smoking status: Former Research scientist (life sciences)  . Smokeless tobacco: Never Used  Substance Use Topics  . Alcohol use: No    Alcohol/week: 0.0 oz    FAMILY HISTORY:   Family History  Problem Relation Age of Onset  . Osteoporosis Mother   . Heart Problems Father   . Stroke Maternal Grandfather   . Stroke Paternal Grandfather   . Stroke Paternal Grandmother      DRUG ALLERGIES:  No Known Allergies  REVIEW OF SYSTEMS:   Review of Systems  Constitutional: Positive for malaise/fatigue. Negative for chills and fever.  HENT: Negative for sore throat.   Eyes: Negative for blurred vision and double vision.  Respiratory: Positive for cough and shortness of breath. Negative for hemoptysis, sputum production, wheezing and stridor.   Cardiovascular: Positive for leg swelling. Negative for chest pain, palpitations and orthopnea.  Gastrointestinal: Negative for abdominal pain, blood in stool, diarrhea, melena, nausea and vomiting.  Genitourinary: Negative for dysuria, flank pain and hematuria.  Musculoskeletal: Negative for back pain and joint pain.  Skin: Negative for rash.  Neurological: Positive for weakness. Negative for dizziness, sensory change, focal weakness, seizures, loss of consciousness and headaches.  Endo/Heme/Allergies: Negative for polydipsia.  Psychiatric/Behavioral: Negative for depression. The patient is not nervous/anxious.     MEDICATIONS AT HOME:   Prior to Admission medications   Medication Sig Start Date End Date Taking? Authorizing Provider  allopurinol (ZYLOPRIM) 100 MG tablet Take 200 mg by mouth daily.    Yes [provider]  aspirin EC 81 MG tablet Take 81 mg by mouth daily.   Yes [provider]  atorvastatin (LIPITOR) 20 MG tablet Take 20 mg by mouth at bedtime.    Yes [provider]  Cholecalciferol  1000 units tablet Take 1,000 Units by mouth daily.    Yes [provider]  enalapril (VASOTEC) 5 MG tablet Take 5 mg by mouth daily.   Yes [provider]  furosemide (LASIX) 40 MG tablet Take 40 mg by mouth.   Yes [provider]  levothyroxine (SYNTHROID, LEVOTHROID) 75 MCG tablet Take 75 mcg by mouth daily before breakfast.   Yes [provider]  metoprolol succinate (TOPROL-XL) 25 MG 24 hr tablet Take 12.5 mg by mouth at bedtime.   Yes [provider]  Multiple Vitamins-Minerals (MULTIVITAMIN ADULTS 50+) TABS Take 1 tablet by mouth daily.   Yes [provider]  tamsulosin (FLOMAX) 0.4 MG CAPS capsule Take 0.4 mg by mouth daily.   Yes [provider]  acetaminophen (TYLENOL) 325 MG tablet Take 325-650 mg by mouth every 6 (six) hours as needed for moderate pain or fever.    [provider]  Levothyroxine Sodium 50 MCG CAPS Take 1 capsule (50 mcg total) by mouth daily before breakfast. Patient not taking: Reported on 01/04/2016 11/29/15   Theodoro Grist, MD  pantoprazole (PROTONIX) 40 MG tablet Take 1 tablet (40 mg total) by mouth 2 (two) times daily before a meal. Patient not taking: Reported on 04/17/2017 05/07/15   Theodoro Grist, MD      VITAL SIGNS:  Blood pressure 127/86, pulse (!) 140, temperature 98.5 F (36.9 C), temperature source Oral, resp. rate (!) 28, weight 260 lb (117.9 kg), SpO2 95 %.  PHYSICAL EXAMINATION:  Physical Exam  GENERAL:  84 y.o.-year-old patient lying in the bed with no acute distress.  EYES: Pupils equal, round, reactive to light and accommodation. No scleral icterus. Extraocular muscles intact.  HEENT: Head atraumatic, normocephalic. Oropharynx and nasopharynx clear.  NECK:  Supple, no jugular venous distention. No thyroid enlargement, no tenderness.  LUNGS: Normal breath sounds bilaterally, no wheezing, but has some mild basilar rales. No use of accessory muscles of respiration.  CARDIOVASCULAR: S1, S2 normal. No murmurs, rubs, or gallops.  ABDOMEN: Soft, nontender, nondistended. Bowel sounds present. No organomegaly or mass.  EXTREMITIES: No cyanosis, or clubbing.  Bilateral leg edema 2-3+. NEUROLOGIC: Cranial nerves II through XII are intact. Muscle strength 5/5 in all extremities. Sensation intact. Gait not checked.  PSYCHIATRIC: The patient is alert and oriented x 3.  SKIN: No obvious rash, lesion, or ulcer.   LABORATORY PANEL:   CBC Recent Labs  Lab 04/17/17 1342  WBC  8.6  HGB 11.2*  HCT 33.5*  PLT 230   ------------------------------------------------------------------------------------------------------------------  Chemistries  Recent Labs  Lab 04/17/17 1342  NA 143  K 4.0  CL 106  CO2 28  GLUCOSE 99  BUN 42*  CREATININE 2.21*  CALCIUM 8.6*   ------------------------------------------------------------------------------------------------------------------  Cardiac Enzymes Recent Labs  Lab 04/17/17 1342  TROPONINI 0.04*   ------------------------------------------------------------------------------------------------------------------  RADIOLOGY:  Dg Chest 2 View  Result Date: 04/17/2017 CLINICAL DATA:  Cough and lower extremity edema EXAM: CHEST  2 VIEW COMPARISON:  December 19, 2015 FINDINGS: There is no appreciable edema or consolidation. Heart is mildly enlarged with pulmonary vascularity within normal limits. No adenopathy. There is aortic atherosclerosis. Patient is status post coronary artery bypass grafting. There is degenerative change in the thoracic spine. IMPRESSION: No edema or consolidation. Mild cardiac enlargement. There is aortic atherosclerosis. Aortic Atherosclerosis (ICD10-I70.0). Electronically Signed   By: Lowella Grip III M.D.   On: 04/17/2017 14:11      IMPRESSION AND PLAN:   A. fib with RVR. The  patient will be admitted to telemetry floor. Start Cardizem drip, continue aspirin, echocardiograph and cardiology consult.  Defer anticoagulation to cardiologist due to risk of fall.  Acute new onset possible diastolic CHF. Start CHF protocol, IV Lasix 40 mg every 12 hours, echocardiogram and cardiology consult.  CKD stage IV.  Stable.  Follow-up BMP while on Lasix.  CAD.  Continue aspirin and statin.  Hypertension.  Continue home hypertension medication. All the records are reviewed and case discussed with ED provider. Management plans discussed with the patient, his daughter and they are in  agreement.  CODE STATUS: DNR  TOTAL TIME TAKING CARE OF THIS PATIENT: 57 minutes.    [name] M.D on 04/17/2017 at 4:04 PM  Between 7am to 6pm - Pager - [phone]  After 6pm go to [email or web address] - Technical brewer [place] [facility]  Office  [phone]  CC: Primary care physician; [facility]   Note: This dictation was prepared with Dragon dictation along with smaller phrase technology. Any transcriptional errors that result from this process are unin

## 2017-04-17 NOTE — ED Triage Notes (Signed)
Pt sent over from [facility] for further eval of swelling of his ankles and a cough for two weeks.

## 2017-04-17 NOTE — Progress Notes (Signed)
Notified Dr. Ubaldo Glassing of Heart rate in the 130's. IV digoxin ordered.

## 2017-04-17 NOTE — Progress Notes (Signed)
Heart rate 139. Increased cardizem to 12.5.

## 2017-04-18 DIAGNOSIS — R609 Edema, unspecified: Secondary | ICD-10-CM

## 2017-04-18 LAB — BASIC METABOLIC PANEL
ANION GAP: 10 (ref 5–15)
BUN: 43 mg/dL — ABNORMAL HIGH (ref 6–20)
CHLORIDE: 102 mmol/L (ref 101–111)
CO2: 30 mmol/L (ref 22–32)
Calcium: 8.5 mg/dL — ABNORMAL LOW (ref 8.9–10.3)
Creatinine, Ser: 2.33 mg/dL — ABNORMAL HIGH (ref 0.61–1.24)
GFR calc Af Amer: 26 mL/min — ABNORMAL LOW (ref >60)
GFR calc non Af Amer: 23 mL/min — ABNORMAL LOW (ref >60)
GLUCOSE: 98 mg/dL (ref 65–99)
POTASSIUM: 4.2 mmol/L (ref 3.5–5.1)
Sodium: 142 mmol/L (ref 135–145)

## 2017-04-18 LAB — CBC
HEMATOCRIT: 33.6 % — AB (ref 40.0–52.0)
HEMOGLOBIN: 11 g/dL — AB (ref 13.0–18.0)
MCH: 31.5 pg (ref 26.0–34.0)
MCHC: 32.7 g/dL (ref 32.0–36.0)
MCV: 96.1 fL (ref 80.0–100.0)
Platelets: 196 10*3/uL (ref 150–440)
RBC: 3.49 MIL/uL — ABNORMAL LOW (ref 4.40–5.90)
RDW: 14.5 % (ref 11.5–14.5)
WBC: 6.9 10*3/uL (ref 3.8–10.6)

## 2017-04-18 LAB — ECHOCARDIOGRAM COMPLETE
HEIGHTINCHES: 71 in
Weight: 4160 oz

## 2017-04-18 LAB — MRSA PCR SCREENING: MRSA by PCR: NEGATIVE

## 2017-04-18 LAB — GLUCOSE, CAPILLARY: Glucose-Capillary: 90 mg/dL (ref 65–99)

## 2017-04-18 LAB — TSH: TSH: 3.561 u[IU]/mL (ref 0.350–4.500)

## 2017-04-18 MED ORDER — GUAIFENESIN-DM 100-10 MG/5ML PO SYRP
5.0000 mL | ORAL_SOLUTION | ORAL | Status: DC | PRN
  Administered 2017-04-18 – 2017-04-21 (×6): 5 mL via ORAL
  Filled 2017-04-18 (×6): qty 5

## 2017-04-18 MED ORDER — DILTIAZEM HCL 30 MG PO TABS
30.0000 mg | ORAL_TABLET | Freq: Four times a day (QID) | ORAL | Status: DC
  Administered 2017-04-18: 30 mg via ORAL
  Filled 2017-04-18 (×2): qty 1

## 2017-04-18 MED ORDER — AMIODARONE HCL IN DEXTROSE 360-4.14 MG/200ML-% IV SOLN: 30.0000 mg/h | INTRAVENOUS | Status: DC

## 2017-04-18 MED ORDER — AMIODARONE HCL IN DEXTROSE 360-4.14 MG/200ML-% IV SOLN
60.0000 mg/h | INTRAVENOUS | Status: DC
  Administered 2017-04-18: 60 mg/h via INTRAVENOUS
  Filled 2017-04-18 (×2): qty 200

## 2017-04-18 MED ORDER — METOPROLOL SUCCINATE ER 25 MG PO TB24
25.0000 mg | ORAL_TABLET | Freq: Every day | ORAL | Status: DC
  Filled 2017-04-18: qty 1

## 2017-04-18 MED ORDER — DILTIAZEM HCL 30 MG PO TABS
60.0000 mg | ORAL_TABLET | Freq: Four times a day (QID) | ORAL | Status: DC
  Administered 2017-04-18 – 2017-04-21 (×10): 60 mg via ORAL
  Filled 2017-04-18 (×10): qty 2

## 2017-04-18 MED ORDER — AMIODARONE HCL 200 MG PO TABS
400.0000 mg | ORAL_TABLET | Freq: Two times a day (BID) | ORAL | Status: DC
  Administered 2017-04-18 – 2017-04-21 (×8): 400 mg via ORAL
  Filled 2017-04-18 (×8): qty 2

## 2017-04-18 MED ORDER — AMIODARONE LOAD VIA INFUSION
150.0000 mg | Freq: Once | INTRAVENOUS | Status: AC
  Administered 2017-04-18: 150 mg via INTRAVENOUS
  Filled 2017-04-18: qty 83.34

## 2017-04-18 NOTE — Progress Notes (Signed)
Notified Dr. Ubaldo Glassing of heart rate in the 130's. AMio drip ordered.

## 2017-04-18 NOTE — Plan of Care (Signed)
  Progressing Education: Knowledge of disease or condition will improve 04/18/2017 1734 - Progressing by Rolley Sims, RN Activity: Ability to tolerate increased activity will improve 04/18/2017 1734 - Progressing by Rolley Sims, RN Education: Knowledge of General Education information will improve 04/18/2017 1734 - Progressing by Rolley Sims, RN

## 2017-04-18 NOTE — Plan of Care (Signed)
  Not Progressing Clinical Measurements: Cardiovascular complication will be avoided 04/18/2017 0148 - Not Progressing by Marylouise Stacks, RN Still tachycardic. On Amio drip

## 2017-04-18 NOTE — Progress Notes (Signed)
Converted back into Afib with rate in the 130's. Notified Dr. Jannifer Franklin. Moved to CCU. Transferred to CCU on the Amio drip, with belongings.

## 2017-04-18 NOTE — Progress Notes (Addendum)
Pt has remained alert and oriented with no c/o pain. Pt has remained on RA, SpO2 > 90%, lung sounds clear to auscultation, bilat +3 pitting edema. Pt has been transitioned to Amio PO-HR remains in 120s-mid 130s,-cardizem trial q6 PO for rate control. Daughter has been updated at bedside. Daughter is concerned about pt going home alone-she works 12hr shifts and is not able to tend to him as needed-temporary SNF for PT was mentioned as pt states he uses a walker at baseline but is not able to get across the room without SOB-unclear if it is related to his new onset CHF.

## 2017-04-18 NOTE — Progress Notes (Signed)
AMio drip started. Weaned cardizem down to 14ml/hour per dr. Ubaldo Glassing. Will continue to monitor.

## 2017-04-18 NOTE — Progress Notes (Addendum)
Notified Dr. Ubaldo Glassing of heart rate in the 130's. IV dig ordered.

## 2017-04-18 NOTE — Consult Note (Signed)
Name: Gary Grant MRN: [id number] DOB: 1923/05/29    ADMISSION DATE:  04/17/2017 CONSULTATION DATE: 04/17/2017  REFERRING MD : Dr. Jannifer Franklin   CHIEF COMPLAINT: Edema and Cough   BRIEF PATIENT DESCRIPTION:  84 yo male admitted to telemetry unit 02/19 with atrial flutter with rvr and new onset CHF exacerbation required transfer to stepdown unit 02/20 for amiodarone gtt  SIGNIFICANT EVENTS  02/19-Pt admitted to telemetry unit  02/20-Pt transferred to stepdown unit due to worsening atrial fibrillation with rvr requiring amiodarone gtt  STUDIES:  Echo 02/19>>  HISTORY OF PRESENT ILLNESS:   This is a 84 yo male with a PMH as listed below who presented to [facility] ER 02/19 with c/o bilateral ankle swelling and cough onset of symptoms 2 weeks prior to presentation to ER.  Per ER notes the pt saw his PCP at [facility] on 02/19 for evaluation of symptoms and his provider recommended the pt proceed to the ER.  Upon arrival to the ER the pt also endorsed having significant dyspnea with exertion.  In the ER EKG revealed atrial flutter with a 2-1 AV conduction, hr 141 bpm, LVH with QRS widening, long QT, and normal axis.  Due to EKG findings the pt received 20 mg iv cardizem with heart rate decreasing to 87 bpm, however the pt remained in atrial flutter cardizem gtt started.  He was subsequently admitted to the telemetry unit by hospitalist team for further workup and treatment.  On 02/20 he required transfer to the stepdown unit due to persistent atrial flutter with rvr despite cardizem gtt, therefore amiodarone gtt ordered.  PAST MEDICAL HISTORY :   has a past medical history of CAD (coronary artery disease), Chronic a-fib ([place]), CKD (chronic kidney disease), COPD (chronic obstructive pulmonary disease) ([place]), GERD (gastroesophageal reflux disease), HTN (hypertension), Hypothyroidism, MI (myocardial infarction) ([place]), PAD (peripheral artery disease) ([place]), Prostate cancer ([place]), SSS (sick sinus  syndrome) ([place]), and Venous insufficiency.  has a past surgical history that includes Cardiac surgery; Fracture surgery; and Eye surgery. Prior to Admission medications   Medication Sig Start Date End Date Taking? Authorizing Provider  allopurinol (ZYLOPRIM) 100 MG tablet Take 200 mg by mouth daily.    Yes [provider]  aspirin EC 81 MG tablet Take 81 mg by mouth daily.   Yes [provider]  atorvastatin (LIPITOR) 20 MG tablet Take 20 mg by mouth at bedtime.    Yes [provider]  Cholecalciferol 1000 units tablet Take 1,000 Units by mouth daily.    Yes [provider]  enalapril (VASOTEC) 5 MG tablet Take 5 mg by mouth daily.   Yes [provider]  furosemide (LASIX) 40 MG tablet Take 40 mg by mouth.   Yes [provider]  levothyroxine (SYNTHROID, LEVOTHROID) 75 MCG tablet Take 75 mcg by mouth daily before breakfast.   Yes [provider]  metoprolol succinate (TOPROL-XL) 25 MG 24 hr tablet Take 12.5 mg by mouth at bedtime.   Yes [provider]  Multiple Vitamins-Minerals (MULTIVITAMIN ADULTS 50+) TABS Take 1 tablet by mouth daily.   Yes [provider]  tamsulosin (FLOMAX) 0.4 MG CAPS capsule Take 0.4 mg by mouth daily.   Yes [provider]  acetaminophen (TYLENOL) 325 MG tablet Take 325-650 mg by mouth every 6 (six) hours as needed for moderate pain or fever.    [provider]  Levothyroxine Sodium 50 MCG CAPS Take 1 capsule (50 mcg total) by mouth daily before breakfast. Patient  not taking: Reported on 01/04/2016 11/29/15   Theodoro Grist, MD  pantoprazole (PROTONIX) 40 MG tablet Take 1 tablet (40 mg total) by mouth 2 (two) times daily before a meal. Patient not taking: Reported on 04/17/2017 05/07/15   Theodoro Grist, MD   No Known Allergies  FAMILY HISTORY:  family history includes Heart Problems in his father; Osteoporosis in his mother; Stroke in his maternal grandfather, paternal  grandfather, and paternal grandmother. SOCIAL HISTORY:  reports that he has quit smoking. he has never used smokeless tobacco. He reports that he does not drink alcohol or use drugs.  REVIEW OF SYSTEMS: Positives in BOLD  Constitutional: fever, chills, weight loss, weight gain, malaise/fatigue and diaphoresis.  HENT: Negative for hearing loss, ear pain, nosebleeds, congestion, sore throat, neck pain, tinnitus and ear discharge.   Eyes: Negative for blurred vision, double vision, photophobia, pain, discharge and redness.  Respiratory: cough, hemoptysis, sputum production, shortness of breath with exertion, wheezing and stridor.   Cardiovascular: chest pain, palpitations, orthopnea, claudication, leg swelling and PND.  Gastrointestinal: Negative for heartburn, nausea, vomiting, abdominal pain, diarrhea, constipation, blood in stool and melena.  Genitourinary: Negative for dysuria, urgency, frequency, hematuria and flank pain.  Musculoskeletal: Negative for myalgias, back pain, joint pain and falls.  Skin: Negative for itching and rash.  Neurological: dizziness, tingling, tremors, sensory change, speech change, focal weakness, seizures, loss of consciousness, weakness and headaches.  Endo/Heme/Allergies: Negative for environmental allergies and polydipsia. Does not bruise/bleed easily.  SUBJECTIVE:  No complaints at this time  VITAL SIGNS: Temp:  [98 F (36.7 C)-98.5 F (36.9 C)] 98 F (36.7 C) (02/19 1915) Pulse Rate:  [70-141] 136 (02/20 0201) Resp:  [18-31] 18 (02/19 1805) BP: (100-135)/(57-93) 122/82 (02/20 0201) SpO2:  [95 %-100 %] 98 % (02/19 1915) Weight:  [117.9 kg (260 lb)] 117.9 kg (260 lb) (02/19 1805)  PHYSICAL EXAMINATION: General: well developed, well nourished male, NAD  Neuro: alert and oriented, follows commands  HEENT: supple, no JVD  Cardiovascular: atrial flutter, hr 130's, no R/G Lungs: clear throughout, even, non labored  Abdomen: obese, +BS x4, soft, non  tender, non distended  Musculoskeletal: 2+ bilateral lower extremity edema  Skin: intact no rashes or lesions   Recent Labs  Lab 04/17/17 1342  NA 143  K 4.0  CL 106  CO2 28  BUN 42*  CREATININE 2.21*  GLUCOSE 99   Recent Labs  Lab 04/17/17 1342  HGB 11.2*  HCT 33.5*  WBC 8.6  PLT 230   Dg Chest 2 View  Result Date: 04/17/2017 CLINICAL DATA:  Cough and lower extremity edema EXAM: CHEST  2 VIEW COMPARISON:  December 19, 2015 FINDINGS: There is no appreciable edema or consolidation. Heart is mildly enlarged with pulmonary vascularity within normal limits. No adenopathy. There is aortic atherosclerosis. Patient is status post coronary artery bypass grafting. There is degenerative change in the thoracic spine. IMPRESSION: No edema or consolidation. Mild cardiac enlargement. There is aortic atherosclerosis. Aortic Atherosclerosis (ICD10-I70.0). Electronically Signed   By: Lowella Grip III M.D.   On: 04/17/2017 14:11    ASSESSMENT / PLAN: Atrial Flutter with RVR  New onset CHF  Acute on chronic renal failure  Mildly elevated troponin's likely demand ischemia  Anemia without obvious acute blood loss  Hx: COPD, Sick Sinus Syndrome, MI, Hypothyroidism, Chronic Atrial Fibrillation, and GERD P: Supplemental O2 for dyspnea and/or hypoxia Prn bronchodilator therapy   Prn CXR  Echo results pending  TSH pending  Continuous telemetry monitoring  Continue  amiodarone per Cardiology recommendations  Cardiology consulted appreciate input  Continue outpatient cardiac medications Continue iv lasix  Subq heparin for VTE prophylaxis  Trend CBC  Monitor for s/sx of bleeding and transfuse for hgb <7 Trend BMP  Replace electrolytes as indicated  [place], [place] Pager [phone] (please enter 7 digits) [place] Pager [phone] (please enter 7 digits)

## 2017-04-18 NOTE — Progress Notes (Signed)
Notified Dr. Ubaldo Glassing of heart rate in the 130's. Converted to SR while on phone with Dr. Ubaldo Glassing.

## 2017-04-18 NOTE — Progress Notes (Signed)
Pt transferred from ccu to unit. Oriented to room, call bell, and staff. Bed in lowest position. Telemetry box verification with tele clerk- Box#: [id number]. Will continue to monitor.

## 2017-04-18 NOTE — H&P (Incomplete)
Notified Dr. Ubaldo Glassing of heart rate in the 130's. Converted to SR while on the phone with Dr. Ubaldo Glassing. Will continue to monitor.

## 2017-04-18 NOTE — Progress Notes (Signed)
[place] [facility] [place] NAME: Erica Osuna    MR#:  [id number]  DATE OF BIRTH:  1923-04-02  SUBJECTIVE:   Patient's heart rate in the 130s. He denies chest pain or palpitations  REVIEW OF SYSTEMS:    Review of Systems  Constitutional: Negative for fever, chills weight loss HENT: Negative for ear pain, nosebleeds, congestion, facial swelling, rhinorrhea, neck pain, neck stiffness and ear discharge.   Respiratory: Negative for cough, shortness of breath, wheezing  Cardiovascular: Negative for chest pain, palpitations and leg swelling.  Gastrointestinal: Negative for heartburn, abdominal pain, vomiting, diarrhea or consitpation Genitourinary: Negative for dysuria, urgency, frequency, hematuria Musculoskeletal: Negative for back pain or joint pain Neurological: Negative for dizziness, seizures, syncope, focal weakness,  numbness and headaches.  Hematological: Does not bruise/bleed easily.  Psychiatric/Behavioral: Negative for hallucinations, confusion, dysphoric mood    Tolerating Diet: yes      DRUG ALLERGIES:  No Known Allergies  VITALS:  Blood pressure 122/80, pulse (!) 133, temperature 97.7 F (36.5 C), temperature source Oral, resp. rate (!) 24, height 5\' 11"  (1.803 m), weight 114.6 kg (252 lb 10.4 oz), SpO2 97 %.  PHYSICAL EXAMINATION:  Constitutional: Appears well-developed and well-nourished. No distress. HENT: Normocephalic. Marland Kitchen Oropharynx is clear and moist.  Eyes: Conjunctivae and EOM are normal. PERRLA, no scleral icterus.  Neck: Normal ROM. Neck supple. No JVD. No tracheal deviation. CVS: Irregular, irregular tachycardia with 2/6 systolic ejection murmur no gallops, no carotid bruit.  Pulmonary: Effort and breath sounds normal, no stridor, rhonchi, wheezes, rales.  Abdominal: Soft. BS +,  no distension, tenderness, rebound or guarding.  Musculoskeletal: Normal range of motion. 2+ lower extremity edema and no tenderness.   Neuro: Alert. CN 2-12 grossly intact. No focal deficits. Skin: Skin is warm and dry. No rash noted. Psychiatric: Normal mood and affect.      LABORATORY PANEL:   CBC Recent Labs  Lab 04/18/17 0530  WBC 6.9  HGB 11.0*  HCT 33.6*  PLT 196   ------------------------------------------------------------------------------------------------------------------  Chemistries  Recent Labs  Lab 04/17/17 1342 04/18/17 0530  NA 143 142  K 4.0 4.2  CL 106 102  CO2 28 30  GLUCOSE 99 98  BUN 42* 43*  CREATININE 2.21* 2.33*  CALCIUM 8.6* 8.5*  MG 1.9  --    ------------------------------------------------------------------------------------------------------------------  Cardiac Enzymes Recent Labs  Lab 04/17/17 1342  TROPONINI 0.04*   ------------------------------------------------------------------------------------------------------------------  RADIOLOGY:  Dg Chest 2 View  Result Date: 04/17/2017 CLINICAL DATA:  Cough and lower extremity edema EXAM: CHEST  2 VIEW COMPARISON:  December 19, 2015 FINDINGS: There is no appreciable edema or consolidation. Heart is mildly enlarged with pulmonary vascularity within normal limits. No adenopathy. There is aortic atherosclerosis. Patient is status post coronary artery bypass grafting. There is degenerative change in the thoracic spine. IMPRESSION: No edema or consolidation. Mild cardiac enlargement. There is aortic atherosclerosis. Aortic Atherosclerosis (ICD10-I70.0). Electronically Signed   By: Lowella Grip III M.D.   On: 04/17/2017 14:11     ASSESSMENT AND PLAN:   84 year old male with history of CAD, chronic atrial fibrillation, chronic kidney disease stage III, COPD who presented with shortness of breath.  1. Atrial fibrillation with RVR: Patient currently on amiodarone drip and metoprolol Continue management as per cardiology   2. New onset diastolic CHF with preserved ejection fraction: Patient has diuresed Continue  Lasix and follow BMP  3. Hypothyroidism: Continue Synthroid  4. Essential hypertension: Continue metoprolol  5. Hyperlipidemia: Continue statin  6. CAD: Continue metoprolol and statin as well as aspirin  7. Chronic kidney disease stage III: Creatinine at baseline  Management plans discussed with the patient and daughter and they are in agreement.  CODE STATUS: dnr  TOTAL TIME TAKING CARE OF THIS PATIENT: 30 minutes.     POSSIBLE D/C 2-3 days, DEPENDING ON CLINICAL CONDITION.   Lars Jeziorski M.D on 04/18/2017 at 12:02 PM  Between 7am to 6pm - Pager - [phone] After 6pm go to [email or web address] - password [email or web address] [place] [facility]  Office  [phone]  CC: Primary care physician; Center, [facility]  Note: This dictation was prepared with Dragon dictation along with smaller phrase technology. Any transcriptional errors that result from this process are unintentional.

## 2017-04-18 NOTE — Progress Notes (Signed)
Dr. Ubaldo Glassing on unit and made aware that patient's HR has been sustaining in 130's Afib since he was transferred from ccu. Per Dr. Ubaldo Glassing increase Diltiazem to 60 mg. Will continue to monitor.

## 2017-04-18 NOTE — Consult Note (Signed)
Cardiology Consultation Note    Patient ID: Gary Grant, MRN: [id number], DOB/AGE: 82-09-1923 82 y.o. Admit date: 04/17/2017   Date of Consult: 04/18/2017 Primary Physician: Center, [facility] Primary Cardiologist: Dr. Nehemiah Massed  Chief Complaint: afib Reason for Consultation: afib with rvr Requesting MD: Dr. Jannifer Franklin  HPI: Gary Grant is a 84 y.o. male with history of coronary artery disease, chronic atrial fibrillation, COPD, peripheral artery disease with sick sinus syndrome and permanent pacemaker who was admitted after presenting with shortness of breath and peripheral edema.  He has had nocturnal dyspnea and a weight gain.  In the emergency room he was noted to have atrial fibrillation rapid ventricular response with heart rates in the 130-140 range.  He was placed on a Cardizem drip after 2 IV Cardizem boluses.  There was no improvement in his heart rate.  He received IV digoxin x1 but had recurrent rapid A. fib.  He was given IV amiodarone load and placed on the drip.  He had a transient improvement in his heart rate with evidence of probable atrial flutter with 2-1 followed by 4-1 block.  This a.m. he continues to have atrial/flutter with rapid ventricular response.  He is currently converted from IV amiodarone to continue to load with 400 mg of amiodarone twice daily.  Hemodynamically stable complains of weakness and fatigue.  Chest x-ray reveals no pulmonary edema.  Laboratories reveal potassium 4.2 with a BUN and creatinine 43 and 3.33 respectively with a GFR of 26.  Baseline GFR appears to be in the low 30s to upper 20s.  Serum troponin is mildly elevated 0.04 likely secondary to demand secondary to his rapid heart rate.  Patient had not been anticoagulated due to his wishes and advanced age with bleeding risk.  Past Medical History:  Diagnosis Date  . CAD (coronary artery disease)   . Chronic a-fib ([place])   . CKD (chronic kidney disease)   . COPD (chronic  obstructive pulmonary disease) ([place])   . GERD (gastroesophageal reflux disease)   . HTN (hypertension)   . Hypothyroidism   . MI (myocardial infarction) ([place])   . PAD (peripheral artery disease) ([place])   . Prostate cancer ([place])   . SSS (sick sinus syndrome) ([place])   . Venous insufficiency       Surgical History:  Past Surgical History:  Procedure Laterality Date  . CARDIAC SURGERY     bypass  . EYE SURGERY    . FRACTURE SURGERY       Home Meds: Prior to Admission medications   Medication Sig Start Date End Date Taking? Authorizing Provider  allopurinol (ZYLOPRIM) 100 MG tablet Take 200 mg by mouth daily.    Yes [provider]  aspirin EC 81 MG tablet Take 81 mg by mouth daily.   Yes [provider]  atorvastatin (LIPITOR) 20 MG tablet Take 20 mg by mouth at bedtime.    Yes [provider]  Cholecalciferol 1000 units tablet Take 1,000 Units by mouth daily.    Yes [provider]  enalapril (VASOTEC) 5 MG tablet Take 5 mg by mouth daily.   Yes [provider]  furosemide (LASIX) 40 MG tablet Take 40 mg by mouth.   Yes [provider]  levothyroxine (SYNTHROID, LEVOTHROID) 75 MCG tablet Take 75 mcg by mouth daily before breakfast.   Yes [provider]  metoprolol succinate (TOPROL-XL) 25 MG 24 hr tablet Take 12.5 mg by mouth at bedtime.   Yes [provider]  Multiple Vitamins-Minerals (MULTIVITAMIN ADULTS 50+) TABS Take 1 tablet by mouth daily.   Yes [provider]  tamsulosin (FLOMAX) 0.4 MG CAPS capsule Take 0.4 mg by mouth daily.   Yes [provider]  acetaminophen (TYLENOL) 325 MG tablet Take 325-650 mg by mouth every 6 (six) hours as needed for moderate pain or fever.    [provider]  Levothyroxine Sodium 50 MCG CAPS Take 1 capsule (50 mcg total) by mouth daily before breakfast. Patient not taking: Reported on 01/04/2016 11/29/15   Theodoro Grist, MD  pantoprazole (PROTONIX) 40  MG tablet Take 1 tablet (40 mg total) by mouth 2 (two) times daily before a meal. Patient not taking: Reported on 04/17/2017 05/07/15   Theodoro Grist, MD    Inpatient Medications:  . allopurinol  200 mg Oral Daily  . amiodarone  400 mg Oral BID  . aspirin EC  81 mg Oral Daily  . atorvastatin  20 mg Oral QHS  . cholecalciferol  1,000 Units Oral Daily  . diltiazem  30 mg Oral Q6H  . furosemide  40 mg Intravenous Q12H  . heparin  5,000 Units Subcutaneous Q8H  . levothyroxine  75 mcg Oral QAC breakfast  . sodium chloride flush  3 mL Intravenous Q12H  . tamsulosin  0.4 mg Oral Daily   . sodium chloride      Allergies: No Known Allergies  Social History   Socioeconomic History  . Marital status: Widowed    Spouse name: Not on file  . Number of children: Not on file  . Years of education: Not on file  . Highest education level: Not on file  Social Needs  . Financial resource strain: Not on file  . Food insecurity - worry: Not on file  . Food insecurity - inability: Not on file  . Transportation needs - medical: Not on file  . Transportation needs - non-medical: Not on file  Occupational History  . Not on file  Tobacco Use  . Smoking status: Former Research scientist (life sciences)  . Smokeless tobacco: Never Used  Substance and Sexual Activity  . Alcohol use: No    Alcohol/week: 0.0 oz  . Drug use: No  . Sexual activity: Not on file  Other Topics Concern  . Not on file  Social History Narrative   Lives at home independently, uses a cane to ambulate. Also has a walker.     Family History  Problem Relation Age of Onset  . Osteoporosis Mother   . Heart Problems Father   . Stroke Maternal Grandfather   . Stroke Paternal Grandfather   . Stroke Paternal Grandmother      Review of Systems: A 12-system review of systems was performed and is negative except as noted in the HPI.  Labs: Recent Labs    04/17/17 1342  TROPONINI 0.04*   Lab Results  Component Value Date   WBC 6.9 04/18/2017    HGB 11.0 (L) 04/18/2017   HCT 33.6 (L) 04/18/2017   MCV 96.1 04/18/2017   PLT 196 04/18/2017    Recent Labs  Lab 04/18/17 0530  NA 142  K 4.2  CL 102  CO2 30  BUN 43*  CREATININE 2.33*  CALCIUM 8.5*  GLUCOSE 98   Lab Results  Component Value Date   CHOL 110 09/08/2012   HDL 41 09/08/2012   LDLCALC 55 09/08/2012   TRIG 69 09/08/2012   No results found for: DDIMER  Radiology/Studies:  Dg Chest 2 View  Result Date: 04/17/2017 CLINICAL DATA:  Cough and lower extremity edema EXAM: CHEST  2 VIEW COMPARISON:  December 19, 2015 FINDINGS: There is no appreciable edema or consolidation. Heart is mildly enlarged with pulmonary vascularity within normal limits. No adenopathy. There is aortic atherosclerosis. Patient is status post coronary artery bypass grafting. There is degenerative change in the thoracic spine. IMPRESSION: No edema or consolidation. Mild cardiac enlargement. There is aortic atherosclerosis. Aortic Atherosclerosis (ICD10-I70.0). Electronically Signed   By: Lowella Grip III M.D.   On: 04/17/2017 14:11    Wt Readings from Last 3 Encounters:  04/18/17 114.6 kg (252 lb 10.4 oz)  01/04/16 126.5 kg (278 lb 12.8 oz)  12/19/15 115.7 kg (255 lb)    EKG: Atrial flutter/fibrillation with rapid ventricular response.  Physical Exam:  Blood pressure 127/73, pulse (!) 132, temperature 98.2 F (36.8 C), temperature source Oral, resp. rate (!) 27, height 5\' 11"  (1.803 m), weight 114.6 kg (252 lb 10.4 oz), SpO2 96 %. Body mass index is 35.24 kg/m. General: Well developed, well nourished, in no acute distress. Head: Normocephalic, atraumatic, sclera non-icteric, no xanthomas, nares are without discharge.  Neck: Negative for carotid bruits. JVD not elevated. Lungs: Clear bilaterally to auscultation without wheezes, rales, or rhonchi. Breathing is unlabored. Heart: Irregular irregular with S1 S2. No murmurs, rubs, or gallops appreciated. Abdomen: Soft, non-tender, non-distended  with normoactive bowel sounds. No hepatomegaly. No rebound/guarding. No obvious abdominal masses. Msk:  Strength and tone appear normal for age. Extremities: No clubbing or cyanosis.  2-3+ lower extremity edema distal pedal pulses are 2+ and equal bilaterally. Neuro: Alert and oriented X 3. No facial asymmetry. No focal deficit. Moves all extremities spontaneously. Psych:  Responds to questions appropriately with a normal affect.     Assessment and Plan  84 year old male with history of chronic atrial fibrillation, chronic kidney disease, venous insufficiency presented to the emergency room with worsening peripheral edema and shortness of breath.  He was noted to be in atrial fib/flutter with rapid ventricular response.  He was hemodynamically stable.  Chest x-ray revealed no pulmonary edema.  Laboratories are unremarkable.  He was placed on a Cardizem drip without prompt improvement in his symptoms.  He was loaded initially IV with amiodarone slight improvement in his heart rate.  It is recurred with rapid ventricular response.  We will continue with p.o. load with 400 mg p.o. twice daily.  Will also add Cardizem short-acting 30 mg tablets 1 p.o. every 6 hours and follow his rate.  May need to increase this or consider the addition of as needed digoxin.  Given his relative renal insufficiency advanced age, digoxin will be difficult to use.  Per his wishes and bleeding risk, will defer chronic anticoagulation.  Signed, Teodoro Spray MD 04/18/2017, 4:53 PM Pager: [phone]

## 2017-04-19 LAB — BASIC METABOLIC PANEL
ANION GAP: 11 (ref 5–15)
BUN: 45 mg/dL — AB (ref 6–20)
CHLORIDE: 99 mmol/L — AB (ref 101–111)
CO2: 29 mmol/L (ref 22–32)
Calcium: 8.3 mg/dL — ABNORMAL LOW (ref 8.9–10.3)
Creatinine, Ser: 2.69 mg/dL — ABNORMAL HIGH (ref 0.61–1.24)
GFR calc non Af Amer: 19 mL/min — ABNORMAL LOW (ref >60)
GFR, EST AFRICAN AMERICAN: 22 mL/min — AB (ref >60)
Glucose, Bld: 95 mg/dL (ref 65–99)
POTASSIUM: 3.9 mmol/L (ref 3.5–5.1)
SODIUM: 139 mmol/L (ref 135–145)

## 2017-04-19 MED ORDER — FUROSEMIDE 10 MG/ML IJ SOLN
20.0000 mg | Freq: Two times a day (BID) | INTRAMUSCULAR | Status: DC
  Administered 2017-04-19: 20 mg via INTRAVENOUS
  Filled 2017-04-19: qty 2

## 2017-04-19 MED ORDER — DOCUSATE SODIUM 100 MG PO CAPS
100.0000 mg | ORAL_CAPSULE | Freq: Two times a day (BID) | ORAL | Status: DC
  Administered 2017-04-19 – 2017-04-22 (×6): 100 mg via ORAL
  Filled 2017-04-19 (×6): qty 1

## 2017-04-19 NOTE — Progress Notes (Signed)
[place] [facility] [place] NAME: Gary Grant    MR#:  [id number]  DATE OF BIRTH:  [birth year]  SUBJECTIVE:   Patient no complaints but heart rate in the 130s.  REVIEW OF SYSTEMS:    Review of Systems  Constitutional: Negative for fever, chills weight loss HENT: Negative for ear pain, nosebleeds, congestion, facial swelling, rhinorrhea, neck pain, neck stiffness and ear discharge.   Respiratory: Negative for cough, shortness of breath, wheezing  Cardiovascular: Negative for chest pain, palpitations and leg swelling.  Gastrointestinal: Negative for heartburn, abdominal pain, vomiting, diarrhea or consitpation Genitourinary: Negative for dysuria, urgency, frequency, hematuria Musculoskeletal: Negative for back pain or joint pain Neurological: Negative for dizziness, seizures, syncope, focal weakness,  numbness and headaches.  Hematological: Does not bruise/bleed easily.  Psychiatric/Behavioral: Negative for hallucinations, confusion, dysphoric mood    Tolerating Diet: yes      DRUG ALLERGIES:  No Known Allergies  VITALS:  Blood pressure (!) 116/94, pulse 81, temperature 98.6 F (37 C), temperature source Oral, resp. rate 17, height 5\' 11"  (1.803 m), weight 250 lb 7.1 oz (113.6 kg), SpO2 93 %.  PHYSICAL EXAMINATION:  Constitutional: Appears well-developed and well-nourished. No distress. HENT: Normocephalic. Marland Kitchen Oropharynx is clear and moist.  Eyes: Conjunctivae and EOM are normal. PERRLA, no scleral icterus.  Neck: Normal ROM. Neck supple. No JVD. No tracheal deviation. CVS: Irregular, irregular tachycardia with 2/6 systolic ejection murmur no gallops, no carotid bruit.  Pulmonary: Effort and breath sounds normal, no stridor, rhonchi, wheezes, rales.  Abdominal: Soft. BS +,  no distension, tenderness, rebound or guarding.  Musculoskeletal: Normal range of motion. 1+ lower extremity edema and no tenderness.  Neuro: Alert. CN 2-12 grossly  intact. No focal deficits. Skin: Skin is warm and dry. No rash noted. Psychiatric: Normal mood and affect.      LABORATORY PANEL:   CBC Recent Labs  Lab 04/18/17 0530  WBC 6.9  HGB 11.0*  HCT 33.6*  PLT 196   ------------------------------------------------------------------------------------------------------------------  Chemistries  Recent Labs  Lab 04/17/17 1342  04/19/17 0623  NA 143   < > 139  K 4.0   < > 3.9  CL 106   < > 99*  CO2 28   < > 29  GLUCOSE 99   < > 95  BUN 42*   < > 45*  CREATININE 2.21*   < > 2.69*  CALCIUM 8.6*   < > 8.3*  MG 1.9  --   --    < > = values in this interval not displayed.   ------------------------------------------------------------------------------------------------------------------  Cardiac Enzymes Recent Labs  Lab 04/17/17 1342  TROPONINI 0.04*   ------------------------------------------------------------------------------------------------------------------  RADIOLOGY:  No results found.   ASSESSMENT AND PLAN:   84 year old male with history of CAD, chronic atrial fibrillation, chronic kidney disease stage III, COPD who presented with shortness of breath.  1. Atrial fibrillation with RVR:  Off amiodarone drip and metoprolol Amiodarone 400 mg p.o. twice daily and Cardizem 60 mg every 6 hours and consdier adding digoxin per Dr. Ubaldo Glassing.  Patient is not a candidate for anticoagulation.  2. New onset diastolic CHF with preserved ejection fraction: Decreased Lasix to 20 mg twice daily due to worsening creatinine and follow BMP  3. Hypothyroidism: Continue Synthroid  4. Essential hypertension: Continue Cardizem and Lasix.  5. Hyperlipidemia: Continue statin  6. CAD: Continue ASA and statin.  7. Chronic kidney disease stage IV: Creatinine at baseline but a little worse.  Management plans  discussed with the patient and daughter and they are in agreement.  CODE STATUS: DNR.  TOTAL TIME TAKING CARE OF THIS  PATIENT: 36 minutes.   POSSIBLE D/C 2 days, DEPENDING ON CLINICAL CONDITION.   Gary Grant M.D on 04/19/2017 at 2:52 PM  Between 7am to 6pm - Pager - [phone] After 6pm go to [email or web address] - password [email or web address] [facility]  Office  [phone]  CC: Primary care physician; Center, [facility]  Note: This dictation was prepared with Dragon dictation along with smaller phrase technology. Any transcriptional errors that result from this process are unintentional.

## 2017-04-19 NOTE — Progress Notes (Signed)
Nutrition Education Note  RD consulted for nutrition education regarding new onset CHF for 84 yo male. PMH COPD, CKD, GERD, PAD.  Marland Kitchen  Pt lives at home a couple blocks from his daughter who does all of the grocery shopping for pt since pt can no longer drive. Daughter also prepares dinners; however, pt makes breakfast and lunch for himself. Pt has some mobility issues due to use of walker, but denies having any issues preparing or eating food.   Dietary recall:  --6am Apple (or banana) & PB OR Cheerios OR instant oatmeal with raisins --12pm baked beans with saltine crackers; occasionally has a quarter pounder from [employer]  --6pm Daughter cooks dinners. Pt states that he eats whatever she makes. He was not forthcoming with details. "she makes fish sometimes. She never lets me eat hotdogs" --Beverages-- pt only drinks water. He stated that his doctors told him to stop drinking soda --Pt denies snacking between meals. --Pt also takes MVI at home, but does not know which one.  RD provided "Low Sodium Nutrition Therapy" handout from the [employer]. Encouraged pt to share information in handout with his daughter. Reviewed patient's dietary recall. Provided examples on ways to decrease sodium intake in diet. Explained to pt that salt in diet causes the edema he has been experiencing; also explained salty foods will also contribute to issues with his breathing. Discouraged intake of processed foods and use of salt shaker. Pt does not add salt to food; hidden sources of salt were reviewed (e.g. Canned beans, saltine crackers, fast food hamburger). Encouraged fresh fruits and vegetables as well as whole grain sources of carbohydrates (e.g. Swap whole grain, low salt crackers for saltines) to maximize fiber intake.   RD discussed why it is important for patient to adhere to diet recommendations, and emphasized the role of fluids, foods to avoid. Teach back method used.  Expect fair  compliance.  Body mass index is 34.93 kg/m. Pt meets criteria for Obesity II based on current BMI.  Current diet order is Heart healthy.  Patient reports that he typically has a really good appetite and also currently has a good appetite confirmed. Patient confirmed that his consuming 100% of meals at this time.   Labs and medications reviewed.   No further nutrition interventions warranted at this time. RD contact information provided. If additional nutrition issues arise, please re-consult RD.    Edmonia Lynch, MS Dietetic Intern Pager: [phone]

## 2017-04-19 NOTE — Progress Notes (Signed)
Patient Name: Gary Grant Date of Encounter: 04/19/2017  Hospital Problem List     Active Problems:   Atrial fibrillation with RVR [facility])    Patient Profile     Pt with history of afib now asdmitted with rvr.   Subjective   Difficult historian. SOB  Inpatient Medications    . allopurinol  200 mg Oral Daily  . amiodarone  400 mg Oral BID  . aspirin EC  81 mg Oral Daily  . atorvastatin  20 mg Oral QHS  . cholecalciferol  1,000 Units Oral Daily  . diltiazem  60 mg Oral Q6H  . furosemide  20 mg Intravenous Q12H  . heparin  5,000 Units Subcutaneous Q8H  . levothyroxine  75 mcg Oral QAC breakfast  . sodium chloride flush  3 mL Intravenous Q12H  . tamsulosin  0.4 mg Oral Daily    Vital Signs    Vitals:   04/18/17 2003 04/19/17 0453 04/19/17 0454 04/19/17 0806  BP: 101/75 123/78  (!) 116/94  Pulse: (!) 131 (!) 130  81  Resp: 18 18  17   Temp: 98.2 F (36.8 C) 98.6 F (37 C)    TempSrc: Oral Oral    SpO2: 96% 97%  93%  Weight:   113.6 kg (250 lb 7.1 oz)   Height:        Intake/Output Summary (Last 24 hours) at 04/19/2017 1107 Last data filed at 04/19/2017 0951 Gross per 24 hour  Intake 642.92 ml  Output 1100 ml  Net -457.08 ml   Filed Weights   04/17/17 1805 04/18/17 0500 04/19/17 0454  Weight: 117.9 kg (260 lb) 114.6 kg (252 lb 10.4 oz) 113.6 kg (250 lb 7.1 oz)    Physical Exam    GEN: Well nourished, well developed, in no acute distress.  HEENT: normal.  Neck: Supple, no JVD, carotid bruits, or masses. Cardiac: irr, irr, no murmurs, rubs, or gallops. No clubbing, cyanosis, edema.  Radials/DP/PT 2+ and equal bilaterally.  Respiratory:  Respirations regular and unlabored, clear to auscultation bilaterally. GI: Soft, nontender, nondistended, BS + x 4. MS: no deformity or atrophy. Skin: warm and dry, no rash. Neuro:  Strength and sensation are intact. Psych: Normal affect.  Labs    CBC Recent Labs    04/17/17 1342 04/18/17 0530  WBC 8.6 6.9   HGB 11.2* 11.0*  HCT 33.5* 33.6*  MCV 96.4 96.1  PLT 230 121   Basic Metabolic Panel Recent Labs    04/17/17 1342 04/18/17 0530 04/19/17 0623  NA 143 142 139  K 4.0 4.2 3.9  CL 106 102 99*  CO2 28 30 29   GLUCOSE 99 98 95  BUN 42* 43* 45*  CREATININE 2.21* 2.33* 2.69*  CALCIUM 8.6* 8.5* 8.3*  MG 1.9  --   --    Liver Function Tests No results for input(s): AST, ALT, ALKPHOS, BILITOT, PROT, ALBUMIN in the last 72 hours. No results for input(s): LIPASE, AMYLASE in the last 72 hours. Cardiac Enzymes Recent Labs    04/17/17 1342  TROPONINI 0.04*   BNP Recent Labs    04/17/17 1342  BNP 417.0*   D-Dimer No results for input(s): DDIMER in the last 72 hours. Hemoglobin A1C No results for input(s): HGBA1C in the last 72 hours. Fasting Lipid Panel No results for input(s): CHOL, HDL, LDLCALC, TRIG, CHOLHDL, LDLDIRECT in the last 72 hours. Thyroid Function Tests Recent Labs    04/18/17 0530  TSH 3.561    Telemetry  Aflutter with variable block. intermittent rvr with intermittent rate control  ECG   afib/aflutter with variable vr  Radiology    Dg Chest 2 View  Result Date: 04/17/2017 CLINICAL DATA:  Cough and lower extremity edema EXAM: CHEST  2 VIEW COMPARISON:  December 19, 2015 FINDINGS: There is no appreciable edema or consolidation. Heart is mildly enlarged with pulmonary vascularity within normal limits. No adenopathy. There is aortic atherosclerosis. Patient is status post coronary artery bypass grafting. There is degenerative change in the thoracic spine. IMPRESSION: No edema or consolidation. Mild cardiac enlargement. There is aortic atherosclerosis. Aortic Atherosclerosis (ICD10-I70.0). Electronically Signed   By: Lowella Grip III M.D.   On: 04/17/2017 14:11    Assessment & Plan    84 yo with history of afib/flutter. Admitted with rvr. Has received iv followed by po amiodarone load. Now on po cardizem at 60 q 6 with intermittent improved control  with episodes or rvr. Will continue with this regimen for now and consdier adding digoxin. Not cadidate for anticoaguation for now.   Signed, Javier Docker Laurelin Elson MD 04/19/2017, 11:07 AM  Pager: [phone]

## 2017-04-19 NOTE — Progress Notes (Signed)
PT Cancellation Note  Patient Details Name: Gary Grant MRN: [id number] DOB: [birth year]   Cancelled Treatment:    Reason Eval/Treat Not Completed: Medical issues which prohibited therapy.   Pt's HR noted to be 133-136 bpm on telemetry screen in hallway; pt noted to be resting in bed.  Due to elevated resting HR, pt currently does not appear appropriate for PT evaluation.  Will re-attempt PT evaluation at a later date/time as medically appropriate.  Leitha Bleak, PT 04/19/17, 11:11 AM [phone]

## 2017-04-19 NOTE — Care Management (Signed)
Admitted from home with rapid atrial fib and new onset CHF.  has had [facility] in the past.  Moved out of icu to 2A 2/20.  Placed dietician consult

## 2017-04-20 LAB — BASIC METABOLIC PANEL
Anion gap: 10 (ref 5–15)
BUN: 49 mg/dL — AB (ref 6–20)
CO2: 28 mmol/L (ref 22–32)
Calcium: 8.2 mg/dL — ABNORMAL LOW (ref 8.9–10.3)
Chloride: 101 mmol/L (ref 101–111)
Creatinine, Ser: 2.86 mg/dL — ABNORMAL HIGH (ref 0.61–1.24)
GFR calc Af Amer: 21 mL/min — ABNORMAL LOW (ref >60)
GFR calc non Af Amer: 18 mL/min — ABNORMAL LOW (ref >60)
GLUCOSE: 100 mg/dL — AB (ref 65–99)
Potassium: 3.9 mmol/L (ref 3.5–5.1)
Sodium: 139 mmol/L (ref 135–145)

## 2017-04-20 LAB — MAGNESIUM: Magnesium: 1.7 mg/dL (ref 1.7–2.4)

## 2017-04-20 MED ORDER — MAGNESIUM SULFATE 2 GM/50ML IV SOLN
2.0000 g | Freq: Once | INTRAVENOUS | Status: AC
  Administered 2017-04-20: 2 g via INTRAVENOUS
  Filled 2017-04-20: qty 50

## 2017-04-20 MED ORDER — SODIUM CHLORIDE 0.9 % IV SOLN
INTRAVENOUS | Status: AC
  Administered 2017-04-20 – 2017-04-21 (×2): via INTRAVENOUS

## 2017-04-20 NOTE — Care Management (Signed)
Patient has not been able to participate with physical therapy due to continued elevation in heart rate. Cardiology is addressing whether patient is candidate for anticoagulation and cardioversion

## 2017-04-20 NOTE — Progress Notes (Signed)
PT Cancellation Note  Patient Details Name: Gary Grant MRN: [id number] DOB: [birth year]   Cancelled Treatment:    Reason Eval/Treat Not Completed: Medical issues which prohibited therapy.  Pt's HR noted to be 131-135 bpm on telemetry screen in hallway; pt noted to be resting in chair. Due to elevated resting HR, pt currently does not appear appropriate for PT evaluation. Will re-attempt PT evaluation at a later date/time as medically appropriate.  Leitha Bleak, PT 04/20/17, 3:36 PM [phone]

## 2017-04-20 NOTE — Progress Notes (Signed)
[place] at [place] NAME: Gary Grant    MR#:  [id number]  DATE OF BIRTH:  1923/08/30  SUBJECTIVE:   Patient no complaints but heart rate is still in the 130.  REVIEW OF SYSTEMS:    Review of Systems  Constitutional: Negative for fever, chills weight loss HENT: Negative for ear pain, nosebleeds, congestion, facial swelling, rhinorrhea, neck pain, neck stiffness and ear discharge.   Respiratory: Negative for cough, shortness of breath, wheezing  Cardiovascular: Negative for chest pain, palpitations and leg swelling.  Gastrointestinal: Negative for heartburn, abdominal pain, vomiting, diarrhea or consitpation Genitourinary: Negative for dysuria, urgency, frequency, hematuria Musculoskeletal: Negative for back pain or joint pain Neurological: Negative for dizziness, seizures, syncope, focal weakness,  numbness and headaches.  Hematological: Does not bruise/bleed easily.  Psychiatric/Behavioral: Negative for hallucinations, confusion, dysphoric mood    Tolerating Diet: yes      DRUG ALLERGIES:  No Known Allergies  VITALS:  Blood pressure 115/90, pulse (!) 130, temperature 98.1 F (36.7 C), temperature source Oral, resp. rate 18, height 5\' 11"  (1.803 m), weight 244 lb 4.8 oz (110.8 kg), SpO2 96 %.  PHYSICAL EXAMINATION:  Constitutional: Appears well-developed and well-nourished. No distress. HENT: Normocephalic. Marland Kitchen Oropharynx is clear and moist.  Eyes: Conjunctivae and EOM are normal. PERRLA, no scleral icterus.  Neck: Normal ROM. Neck supple. No JVD. No tracheal deviation. CVS: Irregular, irregular tachycardia with 2/6 systolic ejection murmur no gallops, no carotid bruit.  Pulmonary: Effort and breath sounds normal, no stridor, rhonchi, wheezes, rales.  Abdominal: Soft. BS +,  no distension, tenderness, rebound or guarding.  Musculoskeletal: Normal range of motion. No lower extremity edema and no tenderness.  Neuro: Alert. CN  2-12 grossly intact. No focal deficits. Skin: Skin is warm and dry. No rash noted. Psychiatric: Normal mood and affect.  LABORATORY PANEL:   CBC Recent Labs  Lab 04/18/17 0530  WBC 6.9  HGB 11.0*  HCT 33.6*  PLT 196   ------------------------------------------------------------------------------------------------------------------  Chemistries  Recent Labs  Lab 04/20/17 0442  NA 139  K 3.9  CL 101  CO2 28  GLUCOSE 100*  BUN 49*  CREATININE 2.86*  CALCIUM 8.2*  MG 1.7   ------------------------------------------------------------------------------------------------------------------  Cardiac Enzymes Recent Labs  Lab 04/17/17 1342  TROPONINI 0.04*   ------------------------------------------------------------------------------------------------------------------  RADIOLOGY:  No results found.   ASSESSMENT AND PLAN:   84 year old male with history of CAD, chronic atrial fibrillation, chronic kidney disease stage III, COPD who presented with shortness of breath.  1. Atrial flutter with 2-1 block, HR is still at 130. Off amiodarone drip and metoprolol Amiodarone 400 mg p.o. twice daily and Cardizem 60 mg every 6 hours and consdier adding digoxin per Dr. Ubaldo Glassing.  Patient is not a candidate for anticoagulation. Per Dr. Ubaldo Glassing, consideration for cardioversion versus ablation however would need anticoagulation due to fairly high procedural stroke risk.   2. New onset diastolic CHF with preserved ejection fraction: Discontinued Lasix to 20 mg twice daily due to worsening creatinine, will give gentle rehydration and follow BMP  3. Hypothyroidism: Continue Synthroid  4. Essential hypertension: Continue Cardizem.  5. Hyperlipidemia: Continue statin  6. CAD: Continue ASA and statin.  7. ARF on chronic kidney disease stage IV: Hold Lasix and gentle rehydration.  Follow-up BMP.  Management plans discussed with the patient and daughter and they are in agreement.  CODE  STATUS: DNR.  TOTAL TIME TAKING CARE OF THIS PATIENT: 36 minutes.   POSSIBLE D/C 2 days,  DEPENDING ON CLINICAL CONDITION.   [name] M.D on 04/20/2017 at 2:06 PM  Between 7am to 6pm - Pager - [phone] After 6pm go to [email or web address] - password [email or web address] [facility]  Office  [phone]  CC: Primary care physician; Center, [facility]  Note: This dictation was prepared with Dragon dictation along with smaller phrase technology. Any transcriptional errors that result from this process are unintentional.

## 2017-04-20 NOTE — Care Management Important Message (Signed)
Important Message  Patient Details  Name: Gary Grant MRN: [id number] Date of Birth: 1923/04/15   Medicare Important Message Given:  Yes Signed IM notice given    Katrina Stack, RN 04/20/2017, 7:47 AM

## 2017-04-20 NOTE — Progress Notes (Signed)
Patient Name: Gary Grant Date of Encounter: 04/20/2017  Hospital Problem List     Active Problems:   Atrial fibrillation with RVR [facility])    Patient Profile     84 year old male with history of heart failure served ejection fraction.  Has atrial flutter with predominantly 2-1 conduction.  Has been on amiodarone p.o. 400 twice daily as well as Cardizem 60 every 6.  Rate intermittently closed however predominantly flutter with 2-1 block.  Has not been a candidate for anticoagulation and therefore cardioversion versus ablation has been deferred.  Patient would have fairly high risk of procedural stroke.  Subjective  No complaints this morning.  Inpatient Medications    . allopurinol  200 mg Oral Daily  . amiodarone  400 mg Oral BID  . aspirin EC  81 mg Oral Daily  . atorvastatin  20 mg Oral QHS  . cholecalciferol  1,000 Units Oral Daily  . diltiazem  60 mg Oral Q6H  . docusate sodium  100 mg Oral BID  . furosemide  20 mg Intravenous Q12H  . heparin  5,000 Units Subcutaneous Q8H  . levothyroxine  75 mcg Oral QAC breakfast  . sodium chloride flush  3 mL Intravenous Q12H  . tamsulosin  0.4 mg Oral Daily    Vital Signs    Vitals:   04/19/17 1727 04/19/17 2100 04/20/17 0111 04/20/17 0347  BP: 103/61 (!) 113/57 102/65 97/65  Pulse: (!) 130 (!) 120 (!) 131 (!) 128  Resp: 18 18  18   Temp:  98 F (36.7 C)  98.7 F (37.1 C)  TempSrc:  Oral  Oral  SpO2: 93% 97% 94% 93%  Weight:    110.8 kg (244 lb 4.8 oz)  Height:        Intake/Output Summary (Last 24 hours) at 04/20/2017 0725 Last data filed at 04/20/2017 0200 Gross per 24 hour  Intake 483 ml  Output 375 ml  Net 108 ml   Filed Weights   04/18/17 0500 04/19/17 0454 04/20/17 0347  Weight: 114.6 kg (252 lb 10.4 oz) 113.6 kg (250 lb 7.1 oz) 110.8 kg (244 lb 4.8 oz)    Physical Exam    GEN: Well nourished, well developed, in no acute distress.  HEENT: normal.  Neck: Supple, no JVD, carotid bruits, or  masses. Cardiac: Irregular rhythm, no murmurs, rubs, or gallops. No clubbing, cyanosis, edema.  Radials/DP/PT 2+ and equal bilaterally.  Respiratory:  Respirations regular and unlabored, clear to auscultation bilaterally. GI: Soft, nontender, nondistended, BS + x 4. MS: no deformity or atrophy. Skin: warm and dry, no rash. Neuro:  Strength and sensation are intact. Psych: Normal affect.  Labs    CBC Recent Labs    04/17/17 1342 04/18/17 0530  WBC 8.6 6.9  HGB 11.2* 11.0*  HCT 33.5* 33.6*  MCV 96.4 96.1  PLT 230 161   Basic Metabolic Panel Recent Labs    04/17/17 1342  04/19/17 0623 04/20/17 0442  NA 143   < > 139 139  K 4.0   < > 3.9 3.9  CL 106   < > 99* 101  CO2 28   < > 29 28  GLUCOSE 99   < > 95 100*  BUN 42*   < > 45* 49*  CREATININE 2.21*   < > 2.69* 2.86*  CALCIUM 8.6*   < > 8.3* 8.2*  MG 1.9  --   --  1.7   < > = values in this interval not displayed.  Liver Function Tests No results for input(s): AST, ALT, ALKPHOS, BILITOT, PROT, ALBUMIN in the last 72 hours. No results for input(s): LIPASE, AMYLASE in the last 72 hours. Cardiac Enzymes Recent Labs    04/17/17 1342  TROPONINI 0.04*   BNP Recent Labs    04/17/17 1342  BNP 417.0*   D-Dimer No results for input(s): DDIMER in the last 72 hours. Hemoglobin A1C No results for input(s): HGBA1C in the last 72 hours. Fasting Lipid Panel No results for input(s): CHOL, HDL, LDLCALC, TRIG, CHOLHDL, LDLDIRECT in the last 72 hours. Thyroid Function Tests Recent Labs    04/18/17 0530  TSH 3.561    Telemetry    Atrial flutter with predominantly  ECG    Atrial flutter  Radiology    Dg Chest 2 View  Result Date: 04/17/2017 CLINICAL DATA:  Cough and lower extremity edema EXAM: CHEST  2 VIEW COMPARISON:  December 19, 2015 FINDINGS: There is no appreciable edema or consolidation. Heart is mildly enlarged with pulmonary vascularity within normal limits. No adenopathy. There is aortic atherosclerosis.  Patient is status post coronary artery bypass grafting. There is degenerative change in the thoracic spine. IMPRESSION: No edema or consolidation. Mild cardiac enlargement. There is aortic atherosclerosis. Aortic Atherosclerosis (ICD10-I70.0). Electronically Signed   By: Lowella Grip III M.D.   On: 04/17/2017 14:11    Assessment & Plan    Atrial flutter-patient continues with predominantly atrial flutter with 2-1 block.  Blood rate is slowed somewhat secondary to amiodarone.  Rate has been difficult to control.  Patient has deferred chronic anticoagulation in the past.  We will need to readdress this with consideration for cardioversion versus ablation however would need anticoagulation due to fairly high procedural stroke risk.  The patient's chads score is at least 3 secondary to age greater than 53 and history of diastolic heart failure.   Renal insufficiency-  Patient's renal function has worsened somewhat.  GFR has decreased from 26-21.  Discontinue IV Lasix and follow for volume overload.  Gentle hydration appears to be appropriate.  Patient is -3.6 L since admission.    Signed, Javier Docker Janiene Aarons MD 04/20/2017, 7:25 AM  Pager: [phone]

## 2017-04-20 NOTE — Progress Notes (Signed)
Paged prime doctor due to low bp. Lasix and cardizem ordered. No answer from page. Will continue to monitor.

## 2017-04-20 NOTE — Progress Notes (Signed)
PT Cancellation Note  Patient Details Name: Gary Grant MRN: [id number] DOB: 1924/02/22   Cancelled Treatment:    Reason Eval/Treat Not Completed: Medical issues which prohibited therapy.  Pt's HR noted to be 131-134 bpm on telemetry screen in hallway; pt noted to be resting in chair.  Due to elevated resting HR, pt currently does not appear appropriate for PT evaluation.  Will re-attempt PT evaluation at a later date/time as medically appropriate.  Leitha Bleak, PT 04/20/17, 8:54 AM [phone]

## 2017-04-21 LAB — CBC
HEMATOCRIT: 30.7 % — AB (ref 40.0–52.0)
Hemoglobin: 10.1 g/dL — ABNORMAL LOW (ref 13.0–18.0)
MCH: 31.5 pg (ref 26.0–34.0)
MCHC: 32.9 g/dL (ref 32.0–36.0)
MCV: 95.6 fL (ref 80.0–100.0)
Platelets: 212 10*3/uL (ref 150–440)
RBC: 3.22 MIL/uL — ABNORMAL LOW (ref 4.40–5.90)
RDW: 14.6 % — AB (ref 11.5–14.5)
WBC: 7 10*3/uL (ref 3.8–10.6)

## 2017-04-21 LAB — BASIC METABOLIC PANEL
Anion gap: 9 (ref 5–15)
BUN: 53 mg/dL — AB (ref 6–20)
CHLORIDE: 103 mmol/L (ref 101–111)
CO2: 27 mmol/L (ref 22–32)
Calcium: 8.1 mg/dL — ABNORMAL LOW (ref 8.9–10.3)
Creatinine, Ser: 2.84 mg/dL — ABNORMAL HIGH (ref 0.61–1.24)
GFR calc Af Amer: 21 mL/min — ABNORMAL LOW (ref >60)
GFR calc non Af Amer: 18 mL/min — ABNORMAL LOW (ref >60)
GLUCOSE: 99 mg/dL (ref 65–99)
POTASSIUM: 4.1 mmol/L (ref 3.5–5.1)
Sodium: 139 mmol/L (ref 135–145)

## 2017-04-21 MED ORDER — SODIUM CHLORIDE 0.9 % IV SOLN
INTRAVENOUS | Status: DC
  Administered 2017-04-22: 02:00:00 via INTRAVENOUS

## 2017-04-21 MED ORDER — METOPROLOL TARTRATE 25 MG PO TABS
25.0000 mg | ORAL_TABLET | Freq: Two times a day (BID) | ORAL | Status: DC
  Administered 2017-04-21 – 2017-04-22 (×2): 25 mg via ORAL
  Filled 2017-04-21 (×2): qty 1

## 2017-04-21 MED ORDER — DILTIAZEM HCL 30 MG PO TABS: 90.0000 mg | ORAL_TABLET | Freq: Four times a day (QID) | ORAL | Status: DC

## 2017-04-21 MED ORDER — DILTIAZEM HCL 30 MG PO TABS
60.0000 mg | ORAL_TABLET | Freq: Four times a day (QID) | ORAL | Status: DC
  Administered 2017-04-21 – 2017-04-22 (×4): 60 mg via ORAL
  Filled 2017-04-21 (×4): qty 2

## 2017-04-21 MED ORDER — METOPROLOL TARTRATE 5 MG/5ML IV SOLN
5.0000 mg | INTRAVENOUS | Status: DC | PRN
  Administered 2017-04-21: 5 mg via INTRAVENOUS
  Filled 2017-04-21: qty 5

## 2017-04-21 NOTE — Progress Notes (Signed)
[facility] [place] NAME: Gary Grant    MR#:  [id number]  DATE OF BIRTH:  1923-07-24  SUBJECTIVE:   Patient no complaints but heart rate is still in 130.  REVIEW OF SYSTEMS:    Review of Systems  Constitutional: Negative for fever, chills weight loss HENT: Negative for ear pain, nosebleeds, congestion, facial swelling, rhinorrhea, neck pain, neck stiffness and ear discharge.   Respiratory: Negative for cough, shortness of breath, wheezing  Cardiovascular: Negative for chest pain, palpitations and leg swelling.  Gastrointestinal: Negative for heartburn, abdominal pain, vomiting, diarrhea or consitpation Genitourinary: Negative for dysuria, urgency, frequency, hematuria Musculoskeletal: Negative for back pain or joint pain Neurological: Negative for dizziness, seizures, syncope, focal weakness,  numbness and headaches.  Hematological: Does not bruise/bleed easily.  Psychiatric/Behavioral: Negative for hallucinations, confusion, dysphoric mood    Tolerating Diet: yes      DRUG ALLERGIES:  No Known Allergies  VITALS:  Blood pressure (!) 124/52, pulse 61, temperature 98.4 F (36.9 C), temperature source Oral, resp. rate 20, height 5\' 11"  (1.803 m), weight 239 lb 14.4 oz (108.8 kg), SpO2 97 %.  PHYSICAL EXAMINATION:  Constitutional: Appears well-developed and well-nourished. No distress. HENT: Normocephalic. Marland Kitchen Oropharynx is clear and moist.  Eyes: Conjunctivae and EOM are normal. PERRLA, no scleral icterus.  Neck: Normal ROM. Neck supple. No JVD. No tracheal deviation. CVS: Irregular, irregular tachycardia with 2/6 systolic ejection murmur no gallops, no carotid bruit.  Pulmonary: Effort and breath sounds normal, no stridor, rhonchi, wheezes, rales.  Abdominal: Soft. BS +,  no distension, tenderness, rebound or guarding.  Musculoskeletal: Normal range of motion. No lower extremity edema and no tenderness.  Neuro: Alert. CN 2-12  grossly intact. No focal deficits. Skin: Skin is warm and dry. No rash noted. Psychiatric: Normal mood and affect.  LABORATORY PANEL:   CBC Recent Labs  Lab 04/21/17 0600  WBC 7.0  HGB 10.1*  HCT 30.7*  PLT 212   ------------------------------------------------------------------------------------------------------------------  Chemistries  Recent Labs  Lab 04/20/17 0442 04/21/17 0600  NA 139 139  K 3.9 4.1  CL 101 103  CO2 28 27  GLUCOSE 100* 99  BUN 49* 53*  CREATININE 2.86* 2.84*  CALCIUM 8.2* 8.1*  MG 1.7  --    ------------------------------------------------------------------------------------------------------------------  Cardiac Enzymes Recent Labs  Lab 04/17/17 1342  TROPONINI 0.04*   ------------------------------------------------------------------------------------------------------------------  RADIOLOGY:  No results found.   ASSESSMENT AND PLAN:   84 year old male with history of CAD, chronic atrial fibrillation, chronic kidney disease stage III, COPD who presented with shortness of breath.  1. Atrial flutter with 2-1 block, HR is still at 130. Off amiodarone drip and metoprolol Amiodarone 400 mg p.o. twice daily and Cardizem 60 mg every 6 hours and consdier adding digoxin per Dr. Ubaldo Glassing.  Patient is not a candidate for anticoagulation. Per Dr. Ubaldo Glassing, consideration for cardioversion versus ablation however would need anticoagulation due to fairly high procedural stroke risk.  Per Dr. Clayborn Bigness, 8 Lopressor 25 mg twice daily and IV Lopressor 5 mg as needed.  2. New onset diastolic CHF with preserved ejection fraction: Discontinued Lasix to 20 mg twice daily due to worsening creatinine.  3. Hypothyroidism: Continue Synthroid  4. Essential hypertension: Continue Cardizem and Lopressor.  5. Hyperlipidemia: Continue statin  6. CAD: Continue ASA and statin.  7. ARF on chronic kidney disease stage IV: Hold Lasix and gentle rehydration.  Follow-up  BMP.  Discussed with Dr. Clayborn Bigness. Management plans discussed with the patient,  his wife and they are in agreement.  CODE STATUS: DNR.  TOTAL TIME TAKING CARE OF THIS PATIENT: 36 minutes.   POSSIBLE D/C 2 days, DEPENDING ON CLINICAL CONDITION.   [name] M.D on 04/21/2017 at 4:26 PM  Between 7am to 6pm - Pager - [phone] After 6pm go to [email or web address] - password [email or web address] [place] [facility]  Office  [phone]  CC: Primary care physician; Center, [facility]  Note: This dictation was prepared with Dragon dictation along with smaller phrase technology. Any transcriptional errors that result from this process are unintentional.

## 2017-04-21 NOTE — Plan of Care (Signed)
  Progressing Cardiac: Ability to achieve and maintain adequate cardiopulmonary perfusion will improve 04/21/2017 2007 - Progressing by Jeri Cos, RN

## 2017-04-21 NOTE — Progress Notes (Signed)
PT Cancellation Note  Patient Details Name: ELRAY DAINS MRN: [id number] DOB: 1923-11-26   Cancelled Treatment:    Reason Eval/Treat Not Completed: Medical issues which prohibited therapy. Patient has had elevated HR for several days, continues to have HR fluctuating between 127-130 bpm at rest, PT will continue to hold mobility assessment until patient is more appropriate.    Royce Macadamia PT, DPT, CSCS    04/21/2017, 11:25 AM

## 2017-04-21 NOTE — Plan of Care (Signed)
  Progressing Cardiac: Ability to achieve and maintain adequate cardiopulmonary perfusion will improve 04/21/2017 0002 - Progressing by Jeri Cos, RN Safety: Ability to remain free from injury will improve 04/21/2017 0002 - Progressing by Jeri Cos, RN

## 2017-04-22 LAB — BASIC METABOLIC PANEL
Anion gap: 9 (ref 5–15)
BUN: 57 mg/dL — ABNORMAL HIGH (ref 6–20)
CALCIUM: 8 mg/dL — AB (ref 8.9–10.3)
CO2: 25 mmol/L (ref 22–32)
Chloride: 104 mmol/L (ref 101–111)
Creatinine, Ser: 2.74 mg/dL — ABNORMAL HIGH (ref 0.61–1.24)
GFR, EST AFRICAN AMERICAN: 22 mL/min — AB (ref >60)
GFR, EST NON AFRICAN AMERICAN: 19 mL/min — AB (ref >60)
Glucose, Bld: 93 mg/dL (ref 65–99)
POTASSIUM: 4.1 mmol/L (ref 3.5–5.1)
Sodium: 138 mmol/L (ref 135–145)

## 2017-04-22 MED ORDER — DILTIAZEM HCL ER COATED BEADS 240 MG PO CP24: 240.0000 mg | ORAL_CAPSULE | Freq: Every day | ORAL | 0 refills | Status: DC

## 2017-04-22 MED ORDER — DILTIAZEM HCL ER COATED BEADS 120 MG PO CP24: 240.0000 mg | ORAL_CAPSULE | Freq: Every day | ORAL | Status: DC

## 2017-04-22 MED ORDER — METOPROLOL TARTRATE 25 MG PO TABS: 25.0000 mg | ORAL_TABLET | Freq: Two times a day (BID) | ORAL | 0 refills | Status: DC

## 2017-04-22 MED ORDER — AMIODARONE HCL 200 MG PO TABS: 400.0000 mg | ORAL_TABLET | Freq: Every day | ORAL | Status: DC

## 2017-04-22 MED ORDER — AMIODARONE HCL 400 MG PO TABS: 400.0000 mg | ORAL_TABLET | Freq: Every day | ORAL | 0 refills | Status: DC

## 2017-04-22 NOTE — Discharge Instructions (Signed)
Heart healthy diet. HHPT

## 2017-04-22 NOTE — Discharge Summary (Signed)
[facility] [place] NAME: Gary Grant    MR#:  [id number]  DATE OF BIRTH:  12/16/1923  DATE OF ADMISSION:  04/17/2017   ADMITTING PHYSICIAN: Lance Coon, MD  DATE OF DISCHARGE: 04/22/2017  PRIMARY CARE PHYSICIAN: [facility], [place]   ADMISSION DIAGNOSIS:  Peripheral edema [R60.9] Atrial fibrillation with rapid ventricular response ([place]) [I48.91] Dyspnea, unspecified type [R06.00] DISCHARGE DIAGNOSIS:  Active Problems:   Atrial fibrillation with RVR ([place])  SECONDARY DIAGNOSIS:   Past Medical History:  Diagnosis Date  . CAD (coronary artery disease)   . Chronic a-fib ([place])   . CKD (chronic kidney disease)   . COPD (chronic obstructive pulmonary disease) ([place])   . GERD (gastroesophageal reflux disease)   . HTN (hypertension)   . Hypothyroidism   . MI (myocardial infarction) ([place])   . PAD (peripheral artery disease) ([place])   . Prostate cancer ([place])   . SSS (sick sinus syndrome) ([place])   . Venous insufficiency    HOSPITAL COURSE:   84 year old male with history of CAD, chronic atrial fibrillation, chronic kidney disease stage III, COPD who presented with shortness of breath.  1. Atrial flutter with 2-1 block, HR is still at 130. Off amiodarone drip and metoprolol He has been treated with miodarone 400 mg p.o. twice daily and Cardizem 60 mg every 6 hours and consdier adding digoxin per Dr. Ubaldo Glassing.  Patient is not a candidate for anticoagulation. Per Dr. Ubaldo Glassing, consideration for cardioversion versus ablation however would need anticoagulation due to fairly high procedural stroke risk.  Per Dr. Clayborn Bigness, add Lopressor 25 mg twice daily and IV Lopressor 5 mg as needed. HR is better at 110's today.  Decrease amiodarone to 480 g daily, changed to Cardizem CD 240 mg daily and continue Lopressor 25 mg twice daily, continue aspirin follow-up as outpatient Per Dr. Clayborn Bigness.  2. New onset diastolic CHF with preserved  ejection fraction: Discontinued Lasix to 20 mg twice daily due to worsening creatinine.  Resume home Lasix after discharge.  3. Hypothyroidism: Continue Synthroid  4. Essential hypertension: Continue Cardizem and Lopressor.  5. Hyperlipidemia: Continue statin  6. CAD: Continue ASA and statin.  7. ARF on chronic kidney disease stage IV: Hold Lasix and gentle rehydration.    Renal function is improving.  May resume Lasix after discharge.  Follow-up BMP as outpatient. Generalized weakness.  PT evaluation suggest home health and PT. Discussed with Dr. Clayborn Bigness. DISCHARGE CONDITIONS:  Stable, discharge to home with home health today. CONSULTS OBTAINED:  Treatment Team:  Teodoro Spray, MD DRUG ALLERGIES:  No Known Allergies DISCHARGE MEDICATIONS:   Allergies as of 04/22/2017   No Known Allergies     Medication List    STOP taking these medications   enalapril 5 MG tablet Commonly known as:  VASOTEC   metoprolol succinate 25 MG 24 hr tablet Commonly known as:  TOPROL-XL     TAKE these medications   acetaminophen 325 MG tablet Commonly known as:  TYLENOL Take 325-650 mg by mouth every 6 (six) hours as needed for moderate pain or fever.   allopurinol 100 MG tablet Commonly known as:  ZYLOPRIM Take 200 mg by mouth daily.   amiodarone 400 MG tablet Commonly known as:  PACERONE Take 1 tablet (400 mg total) by mouth daily. Start taking on:  04/23/2017   aspirin EC 81 MG tablet Take 81 mg by mouth daily.   atorvastatin 20 MG tablet Commonly known as:  LIPITOR  Take 20 mg by mouth at bedtime.   Cholecalciferol 1000 units tablet Take 1,000 Units by mouth daily.   diltiazem 240 MG 24 hr capsule Commonly known as:  CARDIZEM CD Take 1 capsule (240 mg total) by mouth daily.   furosemide 40 MG tablet Commonly known as:  LASIX Take 40 mg by mouth.   levothyroxine 75 MCG tablet Commonly known as:  SYNTHROID, LEVOTHROID Take 75 mcg by mouth daily before  breakfast. What changed:  Another medication with the same name was removed. Continue taking this medication, and follow the directions you see here.   metoprolol tartrate 25 MG tablet Commonly known as:  LOPRESSOR Take 1 tablet (25 mg total) by mouth 2 (two) times daily.   MULTIVITAMIN ADULTS 50+ Tabs Take 1 tablet by mouth daily.   pantoprazole 40 MG tablet Commonly known as:  PROTONIX Take 1 tablet (40 mg total) by mouth 2 (two) times daily before a meal.   tamsulosin 0.4 MG Caps capsule Commonly known as:  FLOMAX Take 0.4 mg by mouth daily.        DISCHARGE INSTRUCTIONS:  See AVS.  If you experience worsening of your admission symptoms, develop shortness of breath, life threatening emergency, suicidal or homicidal thoughts you must seek medical attention immediately by calling 911 or calling your MD immediately  if symptoms less severe.  You Must read complete instructions/literature along with all the possible adverse reactions/side effects for all the Medicines you take and that have been prescribed to you. Take any new Medicines after you have completely understood and accpet all the possible adverse reactions/side effects.   Please note  You were cared for by a hospitalist during your hospital stay. If you have any questions about your discharge medications or the care you received while you were in the hospital after you are discharged, you can call the unit and asked to speak with the hospitalist on call if the hospitalist that took care of you is not available. Once you are discharged, your primary care physician will handle any further medical issues. Please note that NO REFILLS for any discharge medications will be authorized once you are discharged, as it is imperative that you return to your primary care physician (or establish a relationship with a primary care physician if you do not have one) for your aftercare needs so that they can reassess your need for medications  and monitor your lab values.    On the day of Discharge:  VITAL SIGNS:  Blood pressure 118/79, pulse (!) 125, temperature 97.7 F (36.5 C), temperature source Oral, resp. rate 18, height 5\' 11"  (1.803 m), weight 237 lb 11.2 oz (107.8 kg), SpO2 95 %. PHYSICAL EXAMINATION:  GENERAL:  84 y.o.-year-old patient lying in the bed with no acute distress.  EYES: Pupils equal, round, reactive to light and accommodation. No scleral icterus. Extraocular muscles intact.  HEENT: Head atraumatic, normocephalic. Oropharynx and nasopharynx clear.  NECK:  Supple, no jugular venous distention. No thyroid enlargement, no tenderness.  LUNGS: Normal breath sounds bilaterally, no wheezing, rales,rhonchi or crepitation. No use of accessory muscles of respiration.  CARDIOVASCULAR: Irregular rate rhythm, mild tachycardia. No murmurs, rubs, or gallops.  ABDOMEN: Soft, non-tender, non-distended. Bowel sounds present. No organomegaly or mass.  EXTREMITIES: No pedal edema, cyanosis, or clubbing.  NEUROLOGIC: Cranial nerves II through XII are intact. Muscle strength 5/5 in all extremities. Sensation intact. Gait not checked.  PSYCHIATRIC: The patient is alert and oriented x 3.  SKIN: No obvious rash,  lesion, or ulcer.  DATA REVIEW:   CBC Recent Labs  Lab 04/21/17 0600  WBC 7.0  HGB 10.1*  HCT 30.7*  PLT 212    Chemistries  Recent Labs  Lab 04/20/17 0442  04/22/17 0621  NA 139   < > 138  K 3.9   < > 4.1  CL 101   < > 104  CO2 28   < > 25  GLUCOSE 100*   < > 93  BUN 49*   < > 57*  CREATININE 2.86*   < > 2.74*  CALCIUM 8.2*   < > 8.0*  MG 1.7  --   --    < > = values in this interval not displayed.     Microbiology Results  Results for orders placed or performed during the hospital encounter of 04/17/17  MRSA PCR Screening     Status: None   Collection Time: 04/18/17  4:30 AM  Result Value Ref Range Status   MRSA by PCR NEGATIVE NEGATIVE Final    Comment:        The GeneXpert MRSA Assay  (FDA approved for NASAL specimens only), is one component of a comprehensive MRSA colonization surveillance program. It is not intended to diagnose MRSA infection nor to guide or monitor treatment for MRSA infections. Performed at [facility], [address], [place], [place] [address]     RADIOLOGY:  No results found.   Management plans discussed with the patient, family and they are in agreement.  CODE STATUS: DNR   TOTAL TIME TAKING CARE OF THIS PATIENT: 35 minutes.    [name] M.D on 04/22/2017 at 1:36 PM  Between 7am to 6pm - Pager - [phone]  After 6pm go to [email or web address] - Technical brewer [place] [facility]  Office  [phone]  CC: Primary care physician; Center, [facility]   Note: This dictation was prepared with Dragon dictation along with smaller phrase technology. Any transcriptional errors that result from this process are unintentional.

## 2017-04-22 NOTE — Evaluation (Signed)
Physical Therapy Evaluation Patient Details Name: Gary Grant MRN: [id number] DOB: 04-11-1923 Today's Date: 04/22/2017   History of Present Illness  Gary Grant is a 84yo black male born and raise in [place], [place] comes to [facility] on 2/19 with SOB, found to be in AF c RVR. PMH: CAD, CKD3, COPD, AF. PT has been unable to work with pt for 3days as rate control has been very challenging over the course of admission. Pt is independent in ADL/IADL, stopped driving 1MA, relies on daughter for transportation. Uses a RW for AMB x1Year d/t chronic lower extrmeity buckling.   Clinical Impression  Pt admitted with above diagnosis. Pt currently with functional limitations due to the deficits listed below (see "PT Problem List"). Upon entry, the patient is received semirecumbent in bed, no family/caregiver present. The pt is awake and agreeable to participate. No acute distress noted at this time. The pt is alert and oriented x3, pleasant, conversational, and following simple and multi-step commands consistently. Functional mobility assessment demonstrates moderate to heavy strength impairment, close to baseline, the pt now requiring minA for bed mobility, supervision and maximal effort for transfers, and AMB is limited in speed and requires intermittent pausing d/t chronic leg fatigue, whereas the patient performed these at a higher level of independence PTA. HR in AF 91-105 range BPM supine upon entry, and 124-125bpm immediately upon transition to seated EOB, which remains consistent with all AMB thereafter. Pt denies S/Sx of tachycardia, SOB, CP. 1 LOB in session requiring minA from PT to prevent fall. Pt will benefit from skilled PT intervention to increase independence and safety with basic mobility in preparation for discharge to the venue listed below.       Follow Up Recommendations Supervision for mobility/OOB;Home health PT    Equipment Recommendations  None recommended by PT    Recommendations  for Other Services       Precautions / Restrictions Precautions Precautions: Fall Restrictions Weight Bearing Restrictions: No      Mobility  Bed Mobility Overal bed mobility: Needs Assistance Bed Mobility: Supine to Sit;Sit to Supine     Supine to sit: Min assist Sit to supine: Min assist      Transfers Overall transfer level: Needs assistance Equipment used: Rolling walker (2 wheeled) Transfers: Sit to/from Stand Sit to Stand: Supervision         General transfer comment: heavy to maximal effort required  Ambulation/Gait Ambulation/Gait assistance: Min assist Ambulation Distance (Feet): 200 Feet Assistive device: Rolling walker (2 wheeled) Gait Pattern/deviations: WFL(Within Functional Limits) Gait velocity: 0.45m/s   General Gait Details: chronic Rt leanign unsteadiness, 1LOB that required PT to correct. generally good safety awareness and awareness of limitations.   Stairs            Wheelchair Mobility    Modified Rankin (Stroke Patients Only)       Balance Overall balance assessment: Mild deficits observed, not formally tested;Needs assistance         Standing balance support: During functional activity Standing balance-Leahy Scale: Fair               High level balance activites: (intermittent buckling in hips d/t fatigue)               Pertinent Vitals/Pain Pain Assessment: No/denies pain    Home Living Family/patient expects to be discharged to:: Private residence Living Arrangements: Alone Available Help at Discharge: Family(daughter ) Type of Home: House Home Access: Stairs to enter Entrance Stairs-Rails: Right Entrance Stairs-Number  of Steps: 3 Home Layout: One level Home Equipment: [place] - 2 wheels;Cane - single point;Shower seat      Prior Function Level of Independence: Independent with assistive device(s)               Hand Dominance        Extremity/Trunk Assessment                 Communication   Communication: HOH  Cognition Arousal/Alertness: Awake/alert Behavior During Therapy: WFL for tasks assessed/performed Overall Cognitive Status: Within Functional Limits for tasks assessed                                        General Comments      Exercises     Assessment/Plan    PT Assessment Patient needs continued PT services  PT Problem List Decreased strength;Decreased activity tolerance;Cardiopulmonary status limiting activity       PT Treatment Interventions Functional mobility training;Therapeutic activities;Therapeutic exercise    PT Goals (Current goals can be found in the Care Plan section)  Acute Rehab PT Goals Patient Stated Goal: make transfers easier PT Goal Formulation: With patient Time For Goal Achievement: 05/06/17 Potential to Achieve Goals: Good    Frequency Min 2X/week   Barriers to discharge        Co-evaluation               AM-PAC PT "6 Clicks" Daily Activity  Outcome Measure Difficulty turning over in bed (including adjusting bedclothes, sheets and blankets)?: A Little Difficulty moving from lying on back to sitting on the side of the bed? : A Lot Difficulty sitting down on and standing up from a chair with arms (e.g., wheelchair, bedside commode, etc,.)?: A Lot Help needed moving to and from a bed to chair (including a wheelchair)?: A Lot Help needed walking in hospital room?: A Little Help needed climbing 3-5 steps with a railing? : A Lot 6 Click Score: 14    End of Session Equipment Utilized During Treatment: Gait belt Activity Tolerance: Patient tolerated treatment well;Patient limited by fatigue Patient left: in bed;with bed alarm set;with call bell/phone within reach Nurse Communication: Mobility status PT Visit Diagnosis: Unsteadiness on feet (R26.81);Difficulty in walking, not elsewhere classified (R26.2)    Time: 9924-2683 PT Time Calculation (min) (ACUTE ONLY): 21 min   Charges:    PT Evaluation $PT Eval Low Complexity: 1 Low PT Treatments $Therapeutic Activity: 8-22 mins   PT G Codes:       9:35 AM, 2017-05-03 Gary Grant, PT, DPT Physical Therapist - [place] [phone] ([place])  [phone] (mobile)   Grant,Gary C 2017-05-03, 9:35 AM

## 2017-04-22 NOTE — Care Management Note (Signed)
Case Management Note  Patient Details  Name: Gary Grant MRN: [id number] Date of Birth: 11/16/1923  Subjective/Objective:     Referral for HH=PT, RN called to Eugenie Norrie at [facility].                Action/Plan:   Expected Discharge Date:  04/22/17               Expected Discharge Plan:  [place]  In-House Referral:     Discharge planning Services  CM Consult  Post Acute Care Choice:    Choice offered to:  Patient  DME Arranged:    DME Agency:     HH Arranged:  PT, RN [place] Agency:  [place]  Status of Service:  Completed, signed off  If discussed at [place] of Stay Meetings, dates discussed:    Additional Comments:  Celines Femia A, RN 04/22/2017, 11:56 AM

## 2017-04-22 NOTE — Progress Notes (Signed)
Notified MD  Of pt with pauses. No further orders.

## 2017-04-27 ENCOUNTER — Ambulatory Visit: Payer: Medicare HMO | Admitting: Family

## 2017-08-28 ENCOUNTER — Other Ambulatory Visit: Payer: Self-pay

## 2017-08-28 ENCOUNTER — Emergency Department: Payer: Medicare HMO

## 2017-08-28 ENCOUNTER — Encounter: Payer: Self-pay | Admitting: Emergency Medicine

## 2017-08-28 ENCOUNTER — Emergency Department
Admission: EM | Admit: 2017-08-28 | Discharge: 2017-08-28 | Disposition: A | Discharge status: Home / Self Care | Payer: Medicare HMO | Attending: Emergency Medicine | Admitting: Emergency Medicine

## 2017-08-28 DIAGNOSIS — I2510 Atherosclerotic heart disease of native coronary artery without angina pectoris: Secondary | ICD-10-CM | POA: Diagnosis not present

## 2017-08-28 DIAGNOSIS — I129 Hypertensive chronic kidney disease with stage 1 through stage 4 chronic kidney disease, or unspecified chronic kidney disease: Secondary | ICD-10-CM | POA: Insufficient documentation

## 2017-08-28 DIAGNOSIS — Z87891 Personal history of nicotine dependence: Secondary | ICD-10-CM | POA: Insufficient documentation

## 2017-08-28 DIAGNOSIS — Z79899 Other long term (current) drug therapy: Secondary | ICD-10-CM | POA: Diagnosis not present

## 2017-08-28 DIAGNOSIS — R609 Edema, unspecified: Secondary | ICD-10-CM | POA: Diagnosis not present

## 2017-08-28 DIAGNOSIS — E039 Hypothyroidism, unspecified: Secondary | ICD-10-CM | POA: Insufficient documentation

## 2017-08-28 DIAGNOSIS — R531 Weakness: Secondary | ICD-10-CM | POA: Insufficient documentation

## 2017-08-28 DIAGNOSIS — I252 Old myocardial infarction: Secondary | ICD-10-CM | POA: Diagnosis not present

## 2017-08-28 DIAGNOSIS — Z7982 Long term (current) use of aspirin: Secondary | ICD-10-CM | POA: Insufficient documentation

## 2017-08-28 DIAGNOSIS — N189 Chronic kidney disease, unspecified: Secondary | ICD-10-CM | POA: Diagnosis not present

## 2017-08-28 DIAGNOSIS — R0602 Shortness of breath: Secondary | ICD-10-CM | POA: Diagnosis not present

## 2017-08-28 DIAGNOSIS — Z8546 Personal history of malignant neoplasm of prostate: Secondary | ICD-10-CM | POA: Insufficient documentation

## 2017-08-28 DIAGNOSIS — R799 Abnormal finding of blood chemistry, unspecified: Secondary | ICD-10-CM | POA: Diagnosis present

## 2017-08-28 LAB — COMPREHENSIVE METABOLIC PANEL
ALBUMIN: 4.1 g/dL (ref 3.5–5.0)
ALK PHOS: 62 U/L (ref 38–126)
ALT: 27 U/L (ref 0–44)
AST: 30 U/L (ref 15–41)
Anion gap: 10 (ref 5–15)
BUN: 45 mg/dL — AB (ref 8–23)
CO2: 30 mmol/L (ref 22–32)
CREATININE: 2.69 mg/dL — AB (ref 0.61–1.24)
Calcium: 8.9 mg/dL (ref 8.9–10.3)
Chloride: 103 mmol/L (ref 98–111)
GFR calc non Af Amer: 19 mL/min — ABNORMAL LOW (ref >60)
GFR, EST AFRICAN AMERICAN: 22 mL/min — AB (ref >60)
GLUCOSE: 120 mg/dL — AB (ref 70–99)
Potassium: 4.2 mmol/L (ref 3.5–5.1)
SODIUM: 143 mmol/L (ref 135–145)
Total Bilirubin: 0.6 mg/dL (ref 0.3–1.2)
Total Protein: 6.8 g/dL (ref 6.5–8.1)

## 2017-08-28 LAB — CBC
HCT: 35.1 % — ABNORMAL LOW (ref 40.0–52.0)
HEMOGLOBIN: 11.6 g/dL — AB (ref 13.0–18.0)
MCH: 32 pg (ref 26.0–34.0)
MCHC: 33 g/dL (ref 32.0–36.0)
MCV: 96.8 fL (ref 80.0–100.0)
Platelets: 155 10*3/uL (ref 150–440)
RBC: 3.63 MIL/uL — ABNORMAL LOW (ref 4.40–5.90)
RDW: 16.4 % — AB (ref 11.5–14.5)
WBC: 5.1 10*3/uL (ref 3.8–10.6)

## 2017-08-28 LAB — TROPONIN I: Troponin I: <0.03 ng/mL (ref <0.03)

## 2017-08-28 NOTE — ED Provider Notes (Signed)
[facility] Emergency Department Provider Note  Time seen: 6:48 PM  I have reviewed the triage vital signs and the nursing notes.   HISTORY  Chief Complaint Leg Swelling    HPI Gary Grant is a 84 y.o. male with a past medical history of CAD, atrial fibrillation on Eliquis, COPD, hypertension, presents to the emergency department for lab abnormality.  According to the patient he has peripheral edema at baseline, states it has been worse over the past 2 days although admits that his much improved today compared to yesterday.  States he has been feeling more weak and short of breath.  Went to his doctor yesterday, had lab work performed and was called back today saying that his kidney function had declined and for him to go to the emergency department.  We are unable to see the patient's outpatient labs at this time.  Patient denies any leg pain currently does state some discomfort if he is standing for prolonged time.  States increased weakness over the past 2 days.   Past Medical History:  Diagnosis Date  . CAD (coronary artery disease)   . Chronic a-fib ([place])   . CKD (chronic kidney disease)   . COPD (chronic obstructive pulmonary disease) ([place])   . GERD (gastroesophageal reflux disease)   . HTN (hypertension)   . Hypothyroidism   . MI (myocardial infarction) ([place])   . PAD (peripheral artery disease) ([place])   . Prostate cancer ([place])   . SSS (sick sinus syndrome) ([place])   . Venous insufficiency     Patient Active Problem List   Diagnosis Date Noted  . Atrial fibrillation with RVR ([place]) 04/17/2017  . NSTEMI (non-ST elevated myocardial infarction) ([place]) 12/20/2015  . Atrial fibrillation, rapid ([place]) 12/19/2015  . Leg weakness, bilateral 11/29/2015  . Falls 11/29/2015  . Degenerative disc disease, lumbar 11/29/2015  . Back pain 11/29/2015  . Bradycardia, drug induced 11/29/2015  . Dehydration 11/29/2015  . Anemia 11/29/2015  . Hypothyroidism  11/29/2015  . Weakness 11/26/2015  . Nephrolithiasis 05/07/2015  . Constipation 05/07/2015    Past Surgical History:  Procedure Laterality Date  . CARDIAC SURGERY     bypass  . EYE SURGERY    . FRACTURE SURGERY      Prior to Admission medications   Medication Sig Start Date End Date Taking? Authorizing Provider  acetaminophen (TYLENOL) 325 MG tablet Take 325-650 mg by mouth every 6 (six) hours as needed for moderate pain or fever.    [provider]  allopurinol (ZYLOPRIM) 100 MG tablet Take 200 mg by mouth daily.     [provider]  amiodarone (PACERONE) 400 MG tablet Take 1 tablet (400 mg total) by mouth daily. 04/23/17   [name], MD  aspirin EC 81 MG tablet Take 81 mg by mouth daily.    [provider]  atorvastatin (LIPITOR) 20 MG tablet Take 20 mg by mouth at bedtime.     [provider]  Cholecalciferol 1000 units tablet Take 1,000 Units by mouth daily.     [provider]  diltiazem (CARDIZEM CD) 240 MG 24 hr capsule Take 1 capsule (240 mg total) by mouth daily. 04/22/17   [name], MD  furosemide (LASIX) 40 MG tablet Take 40 mg by mouth.    [provider]  levothyroxine (SYNTHROID, LEVOTHROID) 75 MCG tablet Take 75 mcg by mouth daily before breakfast.    [provider]  metoprolol tartrate (LOPRESSOR) 25 MG tablet Take 1  tablet (25 mg total) by mouth 2 (two) times daily. 04/22/17   [name], MD  Multiple Vitamins-Minerals (MULTIVITAMIN ADULTS 50+) TABS Take 1 tablet by mouth daily.    [provider]  pantoprazole (PROTONIX) 40 MG tablet Take 1 tablet (40 mg total) by mouth 2 (two) times daily before a meal. Patient not taking: Reported on 04/17/2017 05/07/15   Theodoro Grist, MD  tamsulosin (FLOMAX) 0.4 MG CAPS capsule Take 0.4 mg by mouth daily.    [provider]    No Known Allergies  Family History  Problem Relation Age of Onset  . Osteoporosis Mother   . Heart Problems Father   .  Stroke Maternal Grandfather   . Stroke Paternal Grandfather   . Stroke Paternal Grandmother     Social History Social History   Tobacco Use  . Smoking status: Former Research scientist (life sciences)  . Smokeless tobacco: Never Used  Substance Use Topics  . Alcohol use: No    Alcohol/week: 0.0 oz  . Drug use: No    Review of Systems Constitutional: Negative for fever. Eyes: Negative for visual complaints ENT: Negative for recent illness/congestion Cardiovascular: Negative for chest pain. Respiratory: Mild increase in shortness of breath especially with exertion per patient. Gastrointestinal: Negative for abdominal pain, vomiting  Genitourinary: Negative for urinary compaints Musculoskeletal: Increased lower extremity edema bilaterally. Skin: Negative for skin complaints  Neurological: Negative for headache All other ROS negative  ____________________________________________   PHYSICAL EXAM:  VITAL SIGNS: ED Triage Vitals  Enc Vitals Group     BP 08/28/17 1818 (!) 141/48     Pulse Rate 08/28/17 1818 60     Resp 08/28/17 1818 18     Temp 08/28/17 1818 98.7 F (37.1 C)     Temp Source 08/28/17 1818 Oral     SpO2 08/28/17 1818 93 %     Weight 08/28/17 1818 250 lb (113.4 kg)     Height 08/28/17 1818 6' (1.829 m)     Head Circumference --      Peak Flow --      Pain Score 08/28/17 1828 0     Pain Loc --      Pain Edu? --      Excl. in [place]? --     Constitutional: Alert and oriented. Well appearing and in no distress. Eyes: Normal exam ENT   Head: Normocephalic and atraumatic.   Mouth/Throat: Mucous membranes are moist. Cardiovascular: Irregular rhythm rate around 50 to 60 bpm. Respiratory: Normal respiratory effort without tachypnea nor retractions. Breath sounds are clear.  No obvious wheeze rales or rhonchi. Gastrointestinal: Soft and nontender. No distention. Musculoskeletal: 3+ lower extremity edema bilaterally, nontender, pitting. Neurologic:  Normal speech and language. No  gross focal neurologic deficits Skin:  Skin is warm, dry and intact.  Psychiatric: Mood and affect are normal.   ____________________________________________    RADIOLOGY  X-rays negative  ____________________________________________   INITIAL IMPRESSION / ASSESSMENT AND PLAN / ED COURSE  Pertinent labs & imaging results that were available during my care of the patient were reviewed by me and considered in my medical decision making (see chart for details).  Patient presents to the emergency department for lab abnormality called by his PCP.  Patient does state increased lower extremity weakness and edema.  We will check labs, chest x-ray and continue to closely monitor.  Overall the patient appears well, no distress.  Patient is labs are resulted largely at baseline.  Patient's renal function is declined although however looking  over the patient's past records it is largely unchanged from prior labs.  We will increase the patient's Lasix from 40 mg daily to 60 mg daily for the next 5 days have the patient follow back up with his doctor early next week for recheck.  Patient agreeable to plan of care. ____________________________________________   FINAL CLINICAL IMPRESSION(S) / ED DIAGNOSES  Peripheral edema     Harvest Dark, MD 08/28/17 1943

## 2017-08-28 NOTE — ED Notes (Signed)
FIRST NURSE NOTE:  Pt here with daughter, states PCP told pt to come to the hospital to be admitted. Pt having increased swelling and decreased kidney function.

## 2017-08-28 NOTE — Discharge Instructions (Addendum)
As we discussed please increase your Lasix from 40 mg (1 tablet) daily to 60 mg (1.5 tablets) daily for the next 5 days.  Please follow-up with your doctor in 5 days for recheck/reevaluation.  Return to the emergency department for any shortness of breath, chest pain, or any other symptom personally concerning to yourself.

## 2017-08-28 NOTE — ED Notes (Signed)
Reports swelling to bilat LE for couple months, worse over last 3 weeks. Went to PCP yesterday and they called pt and sent to ED for "kidney problems." Pt reports SOB with exertion, denies CP or N/V/D. +3 edema to bilat LE. On lasix at home. Family at bedside.

## 2017-08-28 NOTE — ED Triage Notes (Signed)
Pt presents to ED via POV after being called by PCP for follow up from [place] done in officer yesterday. Per PCP kidney function decreased, pt also having increased swelling to BLE.

## 2017-09-24 ENCOUNTER — Ambulatory Visit: Payer: Medicare HMO | Attending: Family | Admitting: Family

## 2017-09-24 ENCOUNTER — Encounter: Payer: Self-pay | Admitting: Family

## 2017-09-24 VITALS — BP 125/55 | HR 61 | Resp 18 | Ht 72.0 in | Wt 255.4 lb

## 2017-09-24 DIAGNOSIS — I130 Hypertensive heart and chronic kidney disease with heart failure and stage 1 through stage 4 chronic kidney disease, or unspecified chronic kidney disease: Secondary | ICD-10-CM | POA: Insufficient documentation

## 2017-09-24 DIAGNOSIS — I482 Chronic atrial fibrillation: Secondary | ICD-10-CM | POA: Insufficient documentation

## 2017-09-24 DIAGNOSIS — Z7901 Long term (current) use of anticoagulants: Secondary | ICD-10-CM | POA: Diagnosis not present

## 2017-09-24 DIAGNOSIS — Z79890 Hormone replacement therapy (postmenopausal): Secondary | ICD-10-CM | POA: Diagnosis not present

## 2017-09-24 DIAGNOSIS — I5032 Chronic diastolic (congestive) heart failure: Secondary | ICD-10-CM | POA: Diagnosis not present

## 2017-09-24 DIAGNOSIS — I739 Peripheral vascular disease, unspecified: Secondary | ICD-10-CM | POA: Diagnosis not present

## 2017-09-24 DIAGNOSIS — Z87891 Personal history of nicotine dependence: Secondary | ICD-10-CM | POA: Diagnosis not present

## 2017-09-24 DIAGNOSIS — I252 Old myocardial infarction: Secondary | ICD-10-CM | POA: Insufficient documentation

## 2017-09-24 DIAGNOSIS — I890 Lymphedema, not elsewhere classified: Secondary | ICD-10-CM | POA: Diagnosis not present

## 2017-09-24 DIAGNOSIS — I495 Sick sinus syndrome: Secondary | ICD-10-CM | POA: Insufficient documentation

## 2017-09-24 DIAGNOSIS — I872 Venous insufficiency (chronic) (peripheral): Secondary | ICD-10-CM | POA: Diagnosis not present

## 2017-09-24 DIAGNOSIS — R0602 Shortness of breath: Secondary | ICD-10-CM | POA: Diagnosis present

## 2017-09-24 DIAGNOSIS — I4891 Unspecified atrial fibrillation: Secondary | ICD-10-CM

## 2017-09-24 DIAGNOSIS — Z79899 Other long term (current) drug therapy: Secondary | ICD-10-CM | POA: Insufficient documentation

## 2017-09-24 DIAGNOSIS — J449 Chronic obstructive pulmonary disease, unspecified: Secondary | ICD-10-CM | POA: Insufficient documentation

## 2017-09-24 DIAGNOSIS — Z7982 Long term (current) use of aspirin: Secondary | ICD-10-CM | POA: Diagnosis not present

## 2017-09-24 DIAGNOSIS — K219 Gastro-esophageal reflux disease without esophagitis: Secondary | ICD-10-CM | POA: Insufficient documentation

## 2017-09-24 DIAGNOSIS — N189 Chronic kidney disease, unspecified: Secondary | ICD-10-CM | POA: Insufficient documentation

## 2017-09-24 DIAGNOSIS — Z8546 Personal history of malignant neoplasm of prostate: Secondary | ICD-10-CM | POA: Insufficient documentation

## 2017-09-24 DIAGNOSIS — I10 Essential (primary) hypertension: Secondary | ICD-10-CM

## 2017-09-24 DIAGNOSIS — I2510 Atherosclerotic heart disease of native coronary artery without angina pectoris: Secondary | ICD-10-CM | POA: Insufficient documentation

## 2017-09-24 DIAGNOSIS — E039 Hypothyroidism, unspecified: Secondary | ICD-10-CM | POA: Diagnosis not present

## 2017-09-24 IMAGING — CT CT ABD-PELV W/O CM
1 of 2 series · 15 of 32 positions shown, 19 images · non-contrast
Comparison: 02/04/2007

CLINICAL DATA: Epigastric pain and generalized body aches for 1
week. Prostate carcinoma.

EXAM:
CT ABDOMEN AND PELVIS WITHOUT CONTRAST
TECHNIQUE: Multidetector CT imaging of the abdomen and pelvis was performed
following the standard protocol without IV contrast.

[Series 2: routine abd pel without · axial · non-contrast · 0.89mm/px · z∈[-508,-74]mm · 15 of 95 slices shown, 19 images]
[im 4/95  soft-tissue]
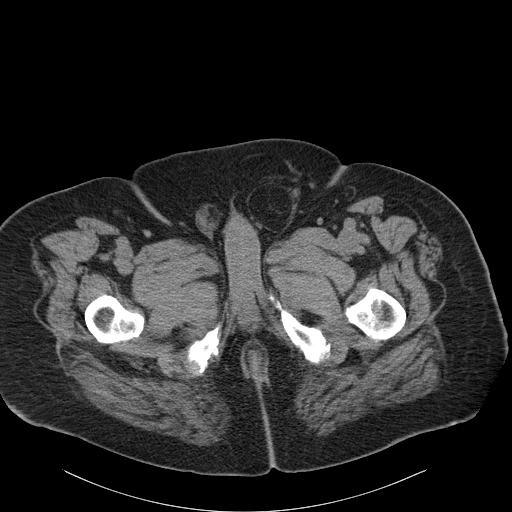
[im 4/95  bone]
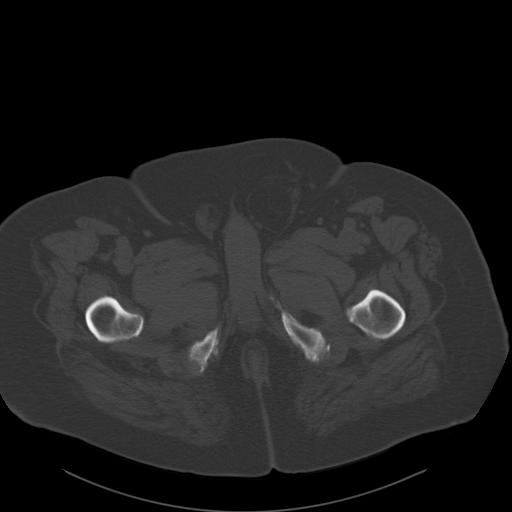
[im 12/95  soft-tissue]
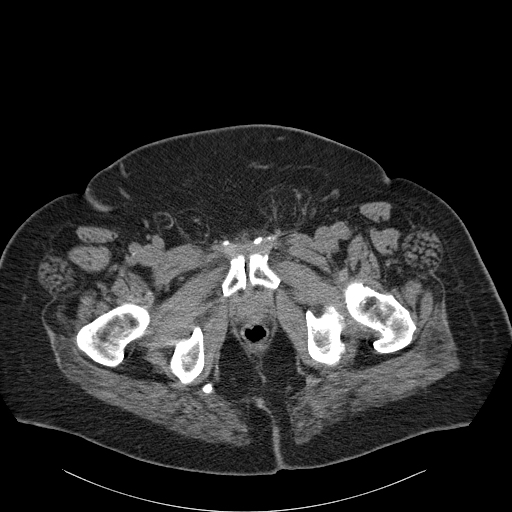
[im 20/95  soft-tissue]
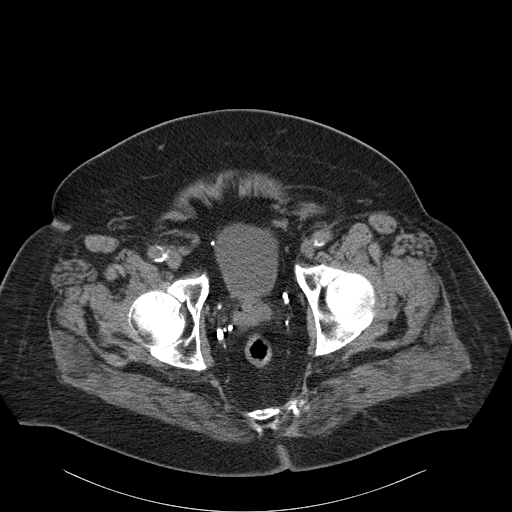
[im 28/95  soft-tissue]
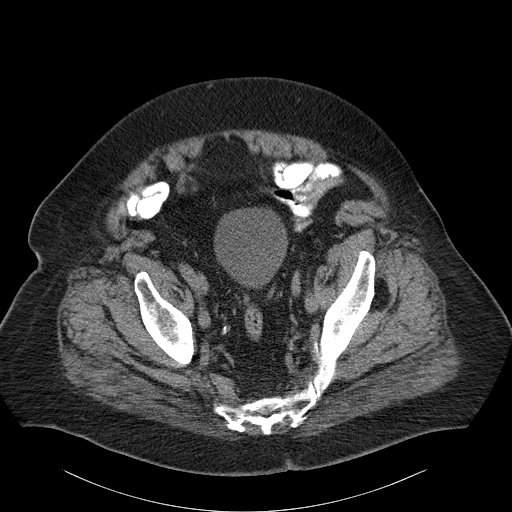
[im 32/95  soft-tissue]
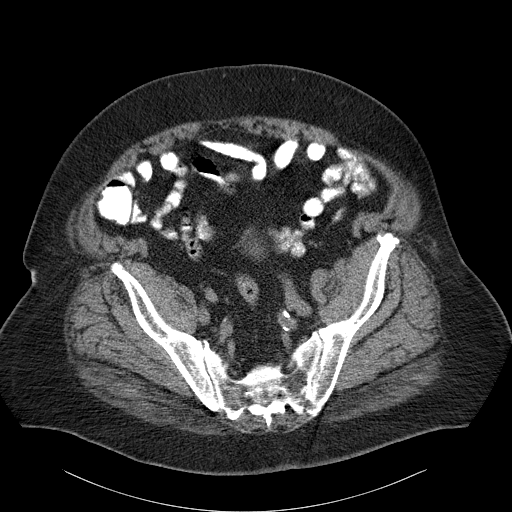
[im 40/95  soft-tissue]
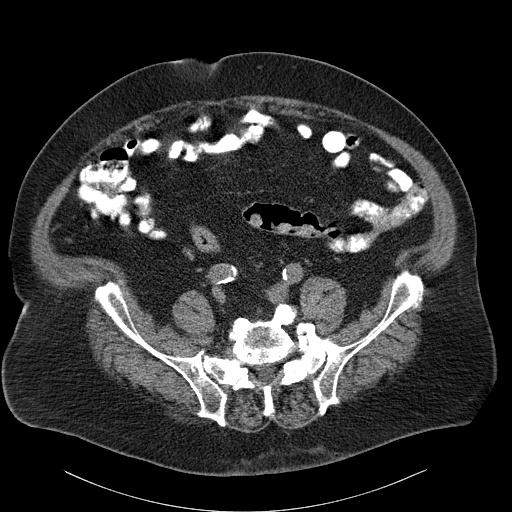
[im 48/95  soft-tissue]
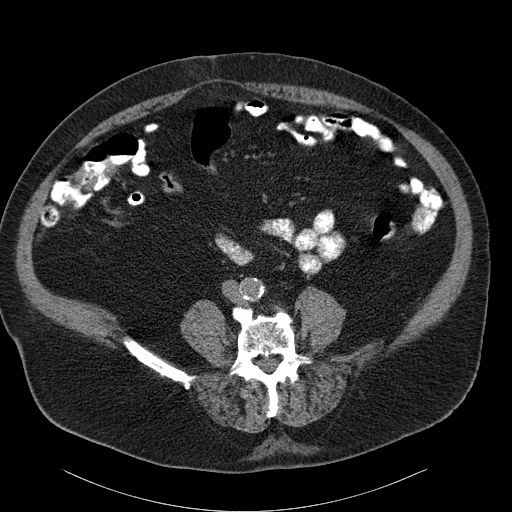
[im 55/95  soft-tissue]
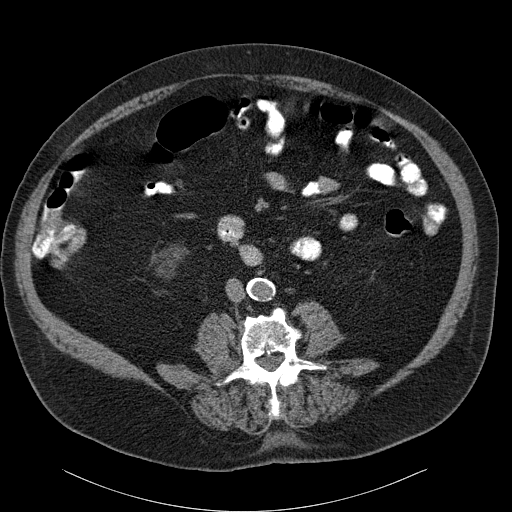
[im 63/95  soft-tissue]
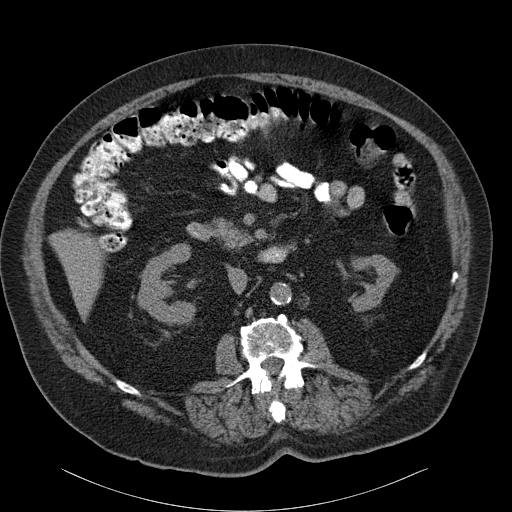
[im 63/95  bone]
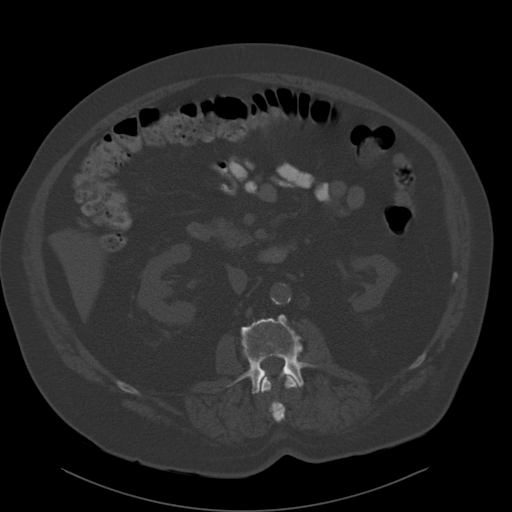
[im 67/95  soft-tissue]
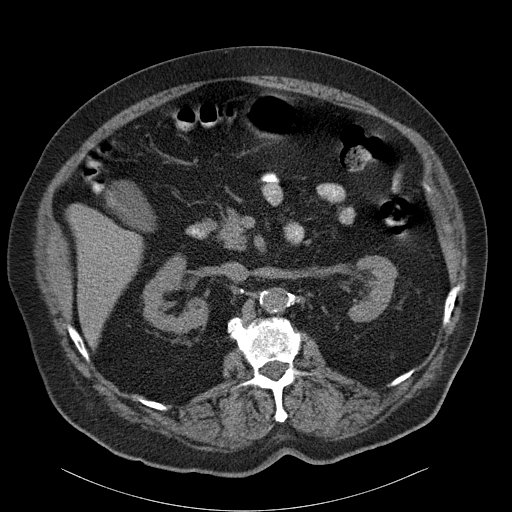
[im 75/95  soft-tissue]
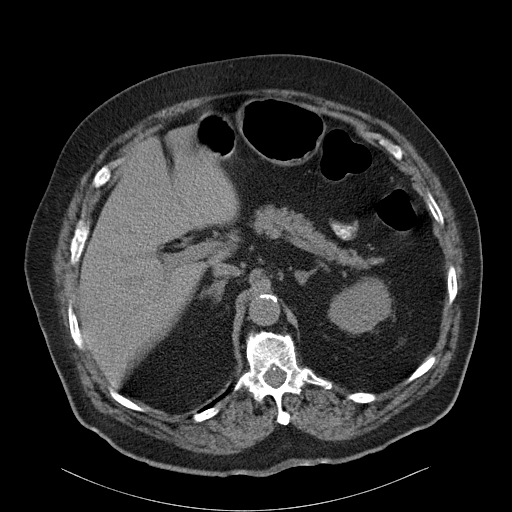
[im 79/95  lung]
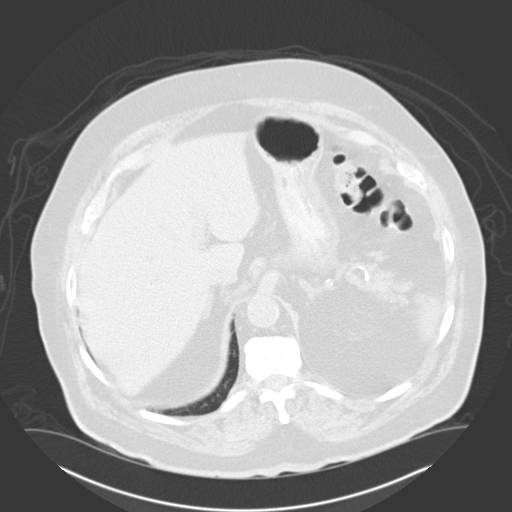
[im 83/95  soft-tissue]
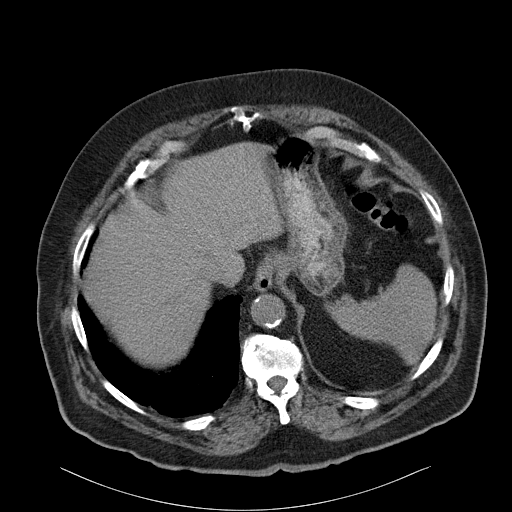
[im 83/95  lung]
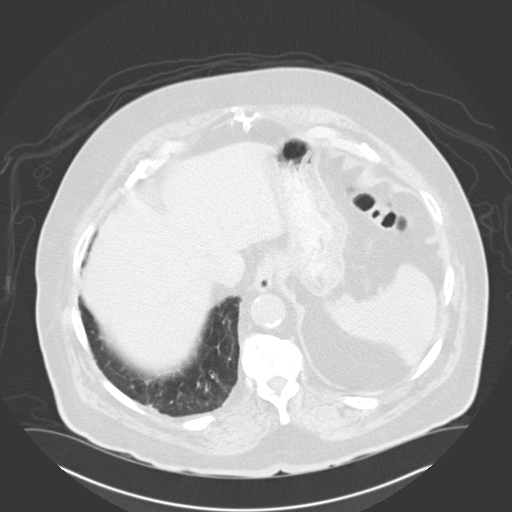
[im 87/95  lung]
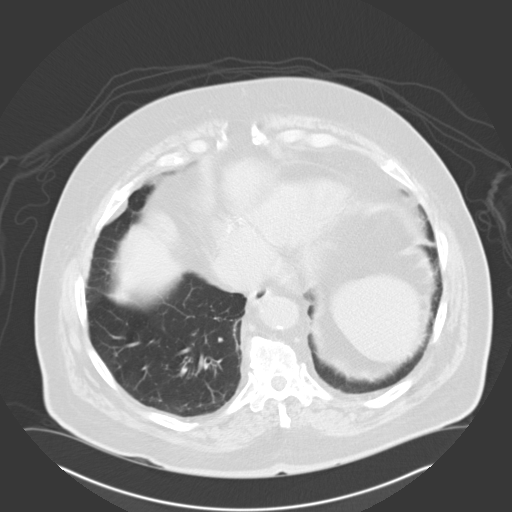
[im 91/95  soft-tissue]
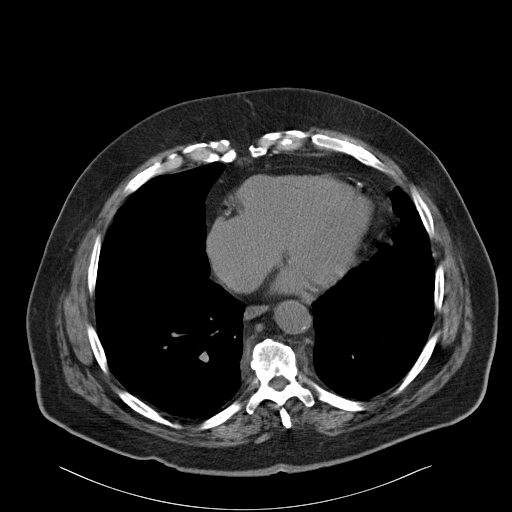
[im 91/95  lung]
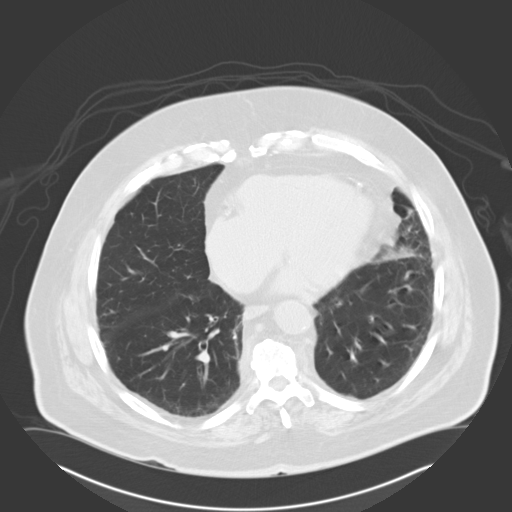

[15 of 32 positions shown; findings below may reference images not displayed]

FINDINGS: Lower chest:  No acute findings.

Hepatobiliary: No mass visualized on this un-enhanced exam.
Gallbladder is unremarkable.

Pancreas: No mass or inflammatory process identified on this
un-enhanced exam.

Spleen: Within normal limits in size.

Adrenals/Urinary Tract: No adrenal masses identified. Tiny less than
5 mm intrarenal calculi are seen in both kidneys. No evidence of
hydronephrosis. No evidence of ureteral calculi or dilatation. No
bladder calculi identified.

Stomach/Bowel: No evidence of obstruction, inflammatory process, or
abnormal fluid collections. Normal appendix visualized.

Vascular/Lymphatic: No pathologically enlarged lymph nodes. No
evidence of abdominal aortic aneurysm.

Reproductive: No mass or other significant abnormality.

Other: Small bilateral inguinal hernias are seen both containing
only fat which are unchanged. A small paraumbilical hernia is also
seen which contains only fat. No evidence herniated bowel loops.

Musculoskeletal:  No suspicious bone lesions identified.
IMPRESSION: Bilateral nonobstructive nephrolithiasis. No evidence of ureteral
calculi, hydronephrosis, or other acute findings.

No evidence of abdominal or pelvic metastatic disease.

Stable small fat containing bilateral inguinal and paraumbilical
hernias.

## 2017-09-24 NOTE — Progress Notes (Signed)
Patient ID: Gary Grant, male    DOB: 1923-12-13, 84 y.o.   MRN: [id number]  HPI  Gary Grant is a 84 y/o male with a history of CAD, HTN, CKD, thyroid disease, chronic atrial fibrillation, GERD, PAD, COPD, MI, previous tobacco use and chronic heart failure.   Echo report from 04/17/17 reviewed and showed an EF of 55-60% along with mild AR/ Gary.   Was in the ED 08/28/17 due to pedal edema. Medications and he was released the same day.  He presents today for his initial visit with a chief complaint of minimal shortness of breath upon moderate exertion. He describes this as chronic in nature having been present for several years. He has associated fatigue and pedal edema along with this. He denies any abdominal distention, palpitations, chest pain, cough, dizziness or weight gain. Feels like he's been doing well since he's been home.   Past Medical History:  Diagnosis Date  . CAD (coronary artery disease)   . CHF (congestive heart failure) ([place])   . Chronic a-fib ([place])   . CKD (chronic kidney disease)   . COPD (chronic obstructive pulmonary disease) ([place])   . GERD (gastroesophageal reflux disease)   . HTN (hypertension)   . Hypothyroidism   . MI (myocardial infarction) ([place])   . PAD (peripheral artery disease) ([place])   . Prostate cancer ([place])   . SSS (sick sinus syndrome) ([place])   . Venous insufficiency    Past Surgical History:  Procedure Laterality Date  . CARDIAC SURGERY     bypass  . EYE SURGERY    . FRACTURE SURGERY     Family History  Problem Relation Age of Onset  . Osteoporosis Mother   . Heart Problems Father   . Stroke Maternal Grandfather   . Stroke Paternal Grandfather   . Stroke Paternal Grandmother    Social History   Tobacco Use  . Smoking status: Former Research scientist (life sciences)  . Smokeless tobacco: Never Used  Substance Use Topics  . Alcohol use: No    Alcohol/week: 0.0 oz   No Known Allergies Prior to Admission medications   Medication Sig Start Date End Date  Taking? Authorizing Provider  allopurinol (ZYLOPRIM) 100 MG tablet Take 200 mg by mouth daily.    Yes [provider]  amiodarone (PACERONE) 400 MG tablet Take 1 tablet (400 mg total) by mouth daily. Patient taking differently: Take 200 mg by mouth daily.  04/23/17  Yes [name], MD  apixaban (ELIQUIS) 2.5 MG TABS tablet Take 2.5 mg by mouth 2 (two) times daily.   Yes [provider]  atorvastatin (LIPITOR) 20 MG tablet Take 20 mg by mouth at bedtime.    Yes [provider]  Cholecalciferol 1000 units tablet Take 1,000 Units by mouth daily.    Yes [provider]  diltiazem (CARDIZEM CD) 240 MG 24 hr capsule Take 1 capsule (240 mg total) by mouth daily. 04/22/17  Yes [name], MD  furosemide (LASIX) 40 MG tablet Take 40 mg by mouth.   Yes [provider]  levothyroxine (SYNTHROID, LEVOTHROID) 75 MCG tablet Take 100 mcg by mouth daily before breakfast.    Yes [provider]  metoprolol tartrate (LOPRESSOR) 25 MG tablet Take 1 tablet (25 mg total) by mouth 2 (two) times daily. Patient taking differently: Take 50 mg by mouth 2 (two) times daily.  04/22/17  Yes [name], MD  Multiple Vitamins-Minerals (MULTIVITAMIN ADULTS 50+) TABS Take 1 tablet by mouth daily.  Yes [provider]  tamsulosin (FLOMAX) 0.4 MG CAPS capsule Take 0.4 mg by mouth daily.   Yes [provider]  acetaminophen (TYLENOL) 325 MG tablet Take 325-650 mg by mouth every 6 (six) hours as needed for moderate pain or fever.    [provider]  aspirin EC 81 MG tablet Take 81 mg by mouth daily.    [provider]  pantoprazole (PROTONIX) 40 MG tablet Take 1 tablet (40 mg total) by mouth 2 (two) times daily before a meal. Patient not taking: Reported on 04/17/2017 05/07/15   Theodoro Grist, MD    Review of Systems  Constitutional: Positive for fatigue. Negative for appetite change.  HENT: Positive for hearing loss. Negative for congestion,  postnasal drip and sore throat.   Eyes: Negative.   Respiratory: Positive for shortness of breath. Negative for cough.   Cardiovascular: Positive for leg swelling. Negative for chest pain and palpitations.  Gastrointestinal: Negative for abdominal distention and abdominal pain.  Endocrine: Negative.   Genitourinary: Negative.   Musculoskeletal: Negative for back pain and neck pain.  Skin: Negative.   Allergic/Immunologic: Negative.   Neurological: Negative for dizziness and light-headedness.  Hematological: Negative for adenopathy. Does not bruise/bleed easily.  Psychiatric/Behavioral: Negative for dysphoric mood and sleep disturbance (sleeping on 1 pillow). The patient is not nervous/anxious.    Vitals:   09/24/17 1137  BP: (!) 125/55  Pulse: 61  Resp: 18  SpO2: 95%  Weight: 255 lb 6 oz (115.8 kg)  Height: 6' (1.829 m)   Wt Readings from Last 3 Encounters:  09/24/17 255 lb 6 oz (115.8 kg)  08/28/17 250 lb (113.4 kg)  04/22/17 237 lb 11.2 oz (107.8 kg)   Lab Results  Component Value Date   CREATININE 2.69 (H) 08/28/2017   CREATININE 2.74 (H) 04/22/2017   CREATININE 2.84 (H) 04/21/2017    Physical Exam  Constitutional: He is oriented to person, place, and time. He appears well-developed and well-nourished.  HENT:  Head: Normocephalic and atraumatic.  Right Ear: Decreased hearing is noted.  Left Ear: Decreased hearing is noted.  Neck: Normal range of motion. Neck supple. No thyromegaly present.  Cardiovascular: Normal rate and regular rhythm.  Pulmonary/Chest: Effort normal. No respiratory distress. He has no wheezes. He has no rales.  Abdominal: Soft. He exhibits no distension. There is no tenderness.  Musculoskeletal: He exhibits edema (2+ pitting R>L). He exhibits no tenderness.  Neurological: He is alert and oriented to person, place, and time.  Skin: Skin is warm and dry.  Psychiatric: He has a normal mood and affect. His behavior is normal. Thought content normal.   Nursing note and vitals reviewed.   Assessment & Plan:  1: Chronic heart failure with preserved ejection fraction- - NYHA class II - mildly fluid overloaded today with pedal edema - weighing daily and he was instructed to call for an overnight weight gain of >2 pounds or a weekly weight gain of >5 pounds - does add "little salt" to a few things like tomatoes. Discussed the importance of not adding salt to anything. Does live alone but family grocery shops for him and reading food labels was discussed with family (patient can not read). Written dietary information was given to the family - drinking ~ 3 bottles of water daily - BNP 04/17/17 was 417.0  2: Atrial fibrillation- - currently rate controlled - saw cardiology Nehemiah Massed) 06/26/17 - taking amiodarone, apixaban, dilitiazem & metoprolol  3: HTN- - BP looks good today - goes  to [facility] for primary care - BMP from 08/28/17 reviewed and showed sodium 143, potassium 4.2 and GFR 22  4: Lymphedema- - stage 2 - limited in ability to exercise due to symptoms - does elevate his legs when he's sitting for long periods of time - does not wear TED hose as he says that they are too difficult to put on as he lives by himself; encouraged him to have family help him put them on when they can - discussed lymphapress compression boots with patient and daughter and they are interested; will see how edema looks at next appointment and make referral at that time  Medication bottles reviewed.  Return in 6 weeks or sooner for any questions/problems before then.

## 2017-09-25 ENCOUNTER — Encounter: Payer: Self-pay | Admitting: Family

## 2017-09-25 DIAGNOSIS — I5032 Chronic diastolic (congestive) heart failure: Secondary | ICD-10-CM | POA: Insufficient documentation

## 2017-09-25 DIAGNOSIS — I890 Lymphedema, not elsewhere classified: Secondary | ICD-10-CM | POA: Insufficient documentation

## 2017-09-25 DIAGNOSIS — I10 Essential (primary) hypertension: Secondary | ICD-10-CM | POA: Insufficient documentation

## 2017-09-27 IMAGING — CR DG HAND COMPLETE 3+V*R*
1 series · 3 of 3 positions shown · non-contrast
Comparison: None.

CLINICAL DATA: Right hand swelling and redness since 05/02/2015. No
known injury. Initial encounter.

EXAM:
RIGHT HAND - COMPLETE 3+ VIEW

[Series 1: dg hand complete right · 0.14mm/px · 3 of 3 slices shown]
[im 1/3]
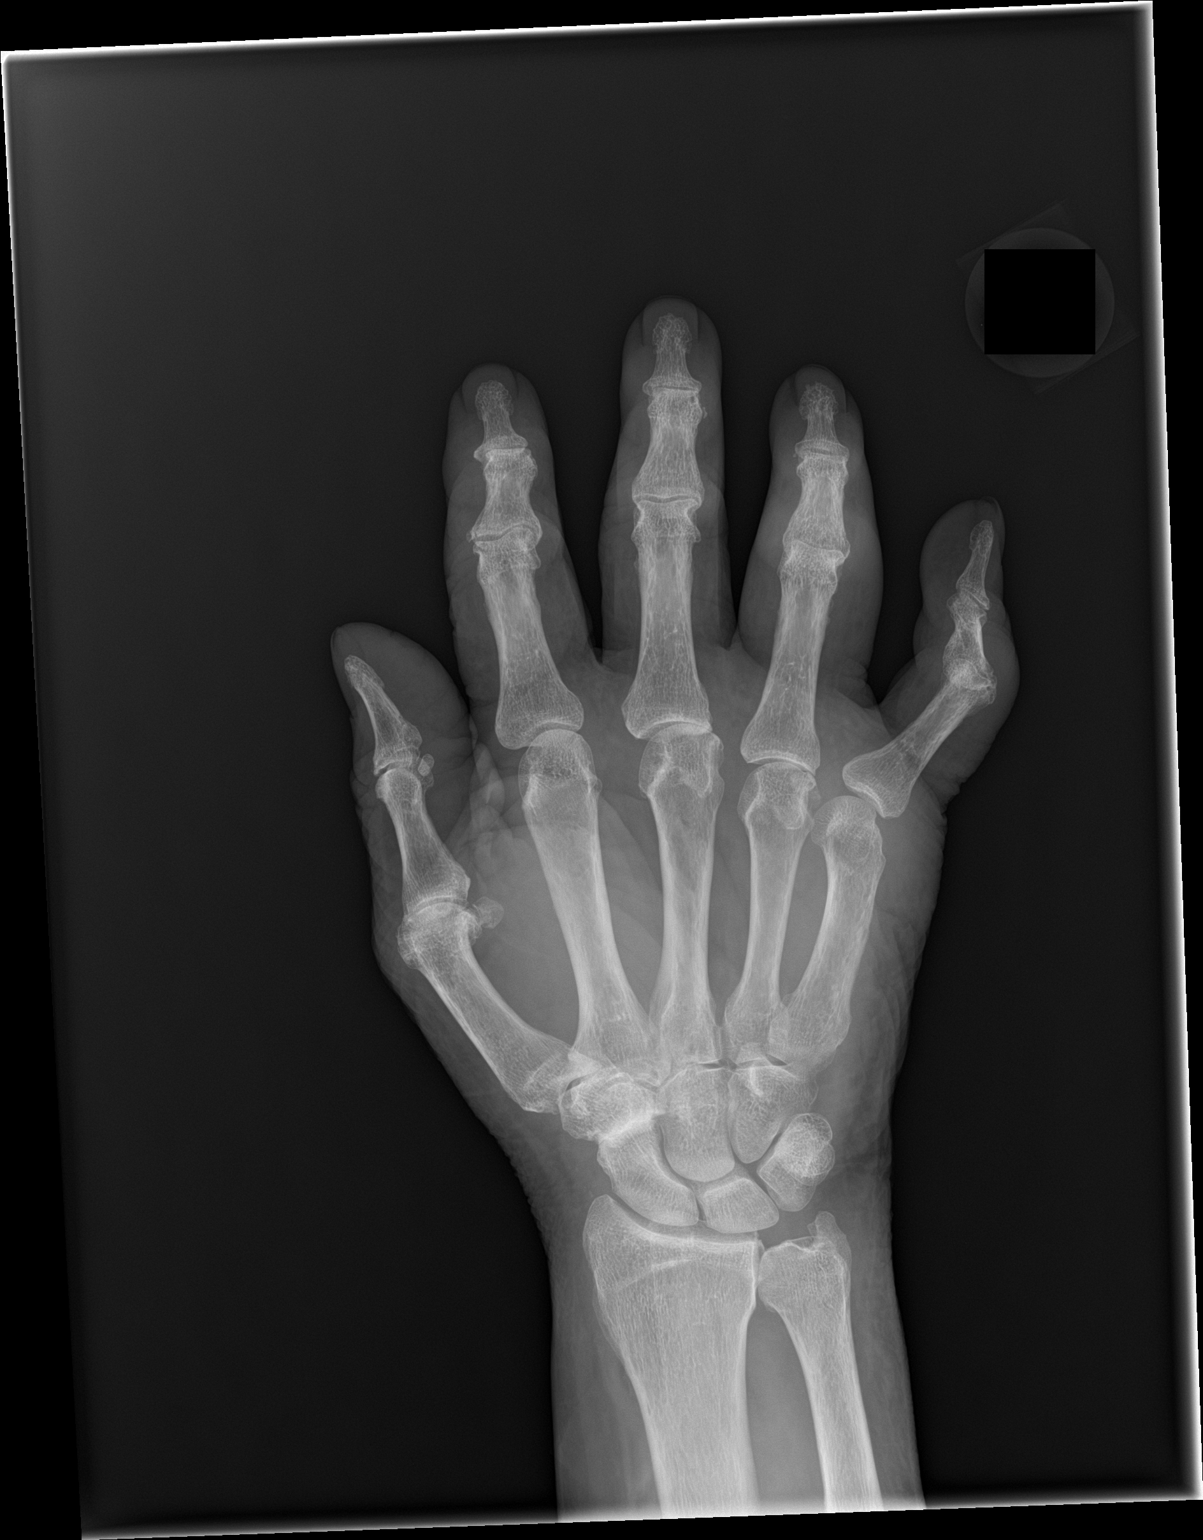
[im 2/3]
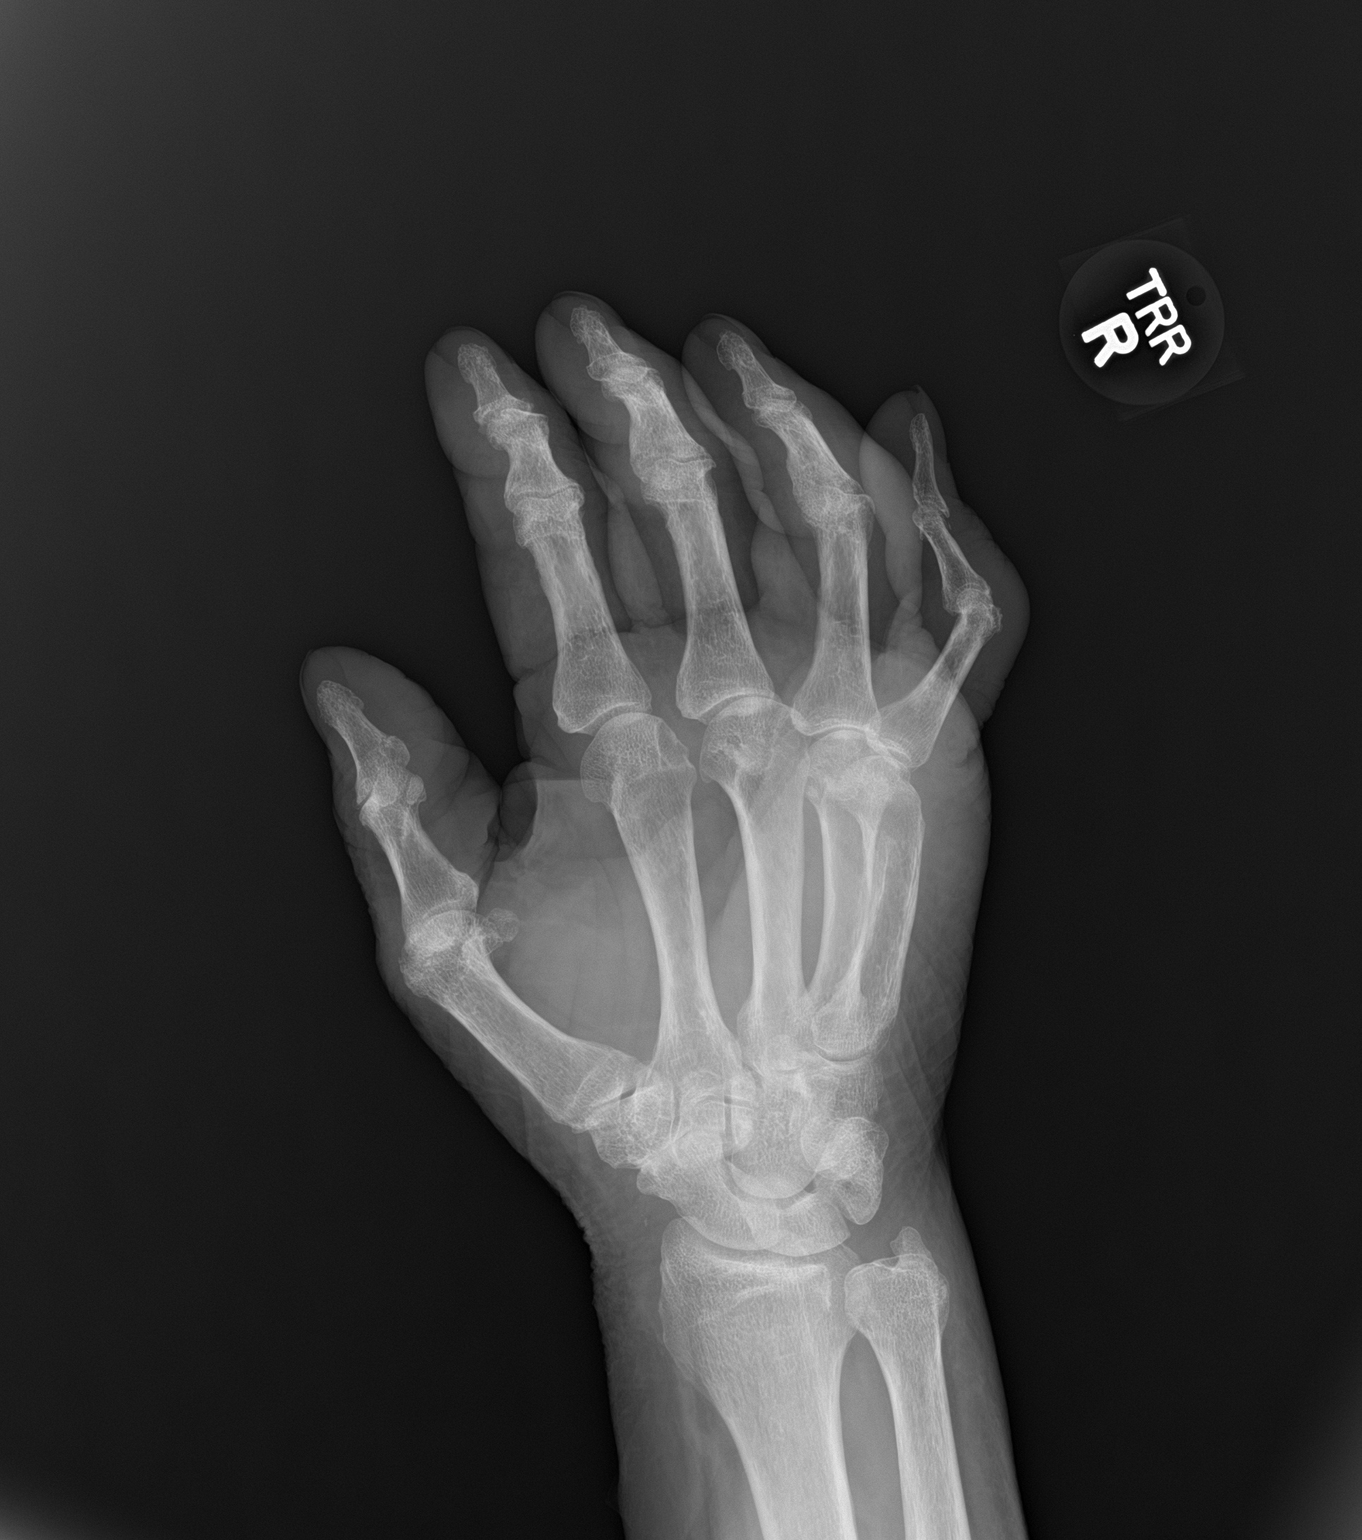
[im 3/3]
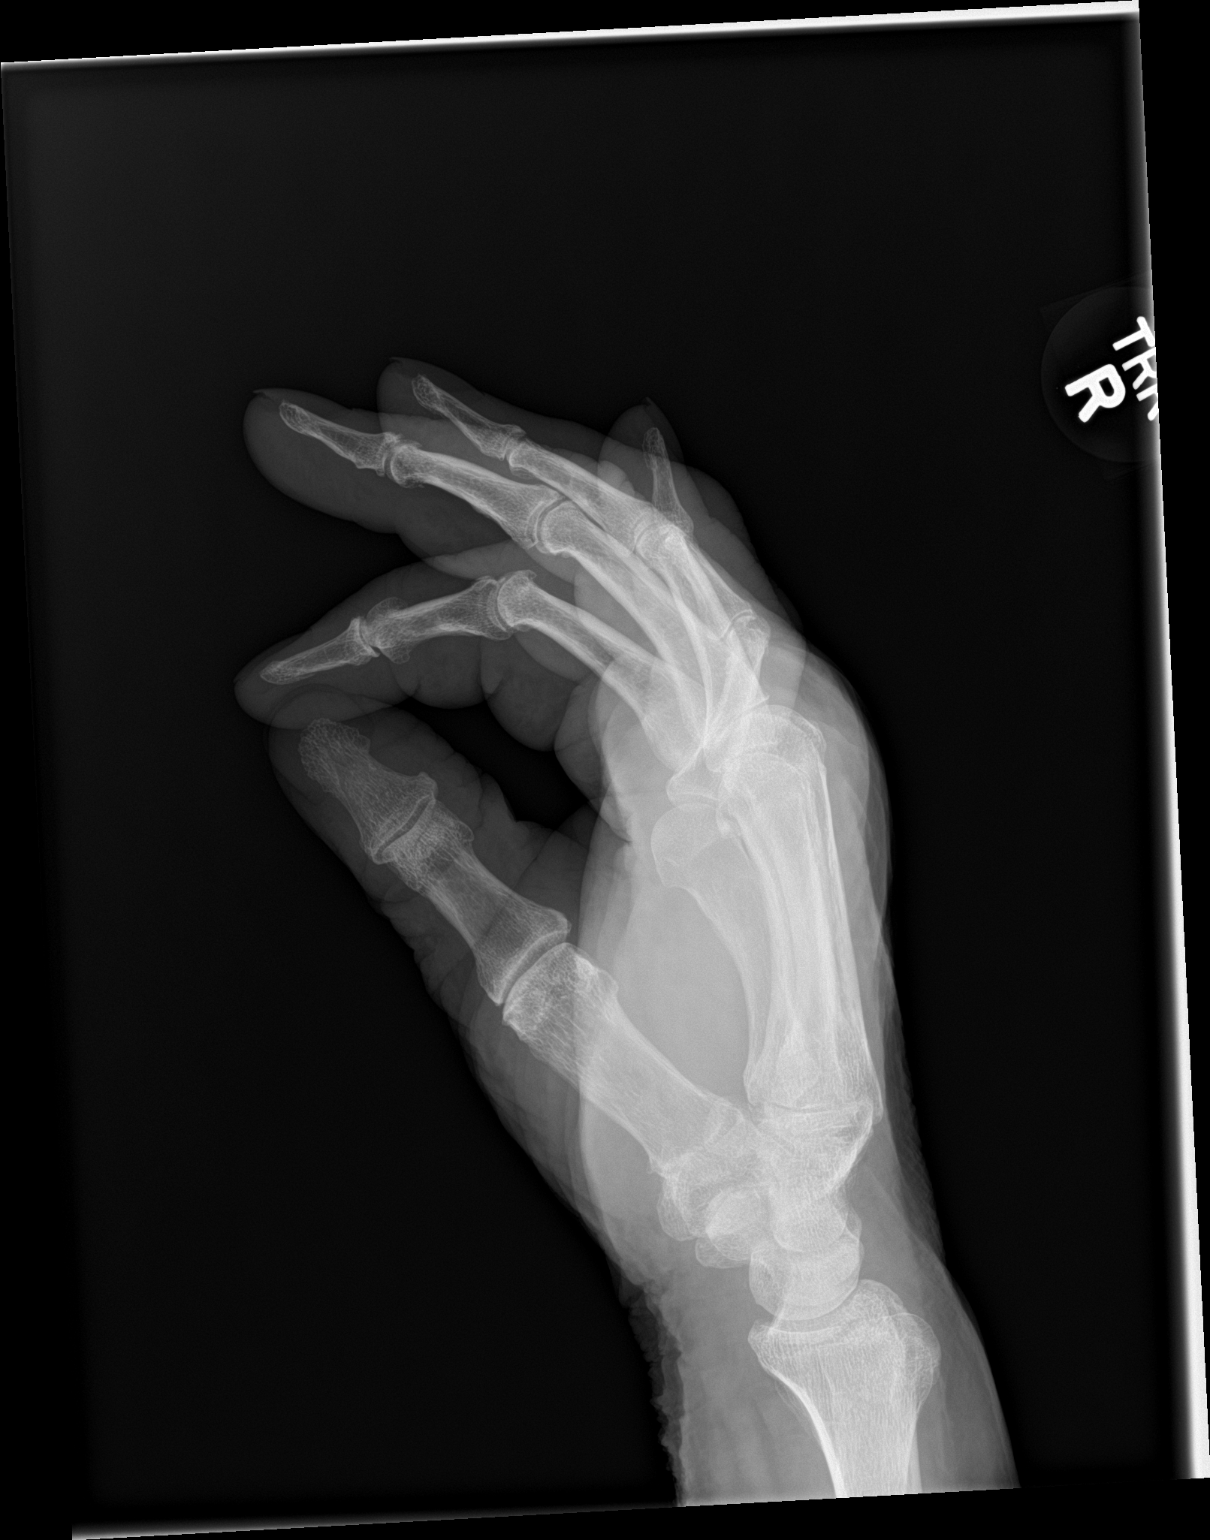

[3 of 3 positions shown; findings below may reference images not displayed]

FINDINGS: No acute bony or joint abnormality is identified. Osteoarthritis
about the fingers appears worst at the DIP joint of the index
finger. Marked joint space narrowing is also seen scaphoid trapezium
trapezoid joint. No radiopaque foreign body or soft tissue gas
collection is identified. No abnormal soft tissue calcification is
seen.
IMPRESSION: No acute abnormality.

Multifocal osteoarthritis.

## 2017-11-13 ENCOUNTER — Ambulatory Visit: Payer: Medicare HMO | Attending: Family | Admitting: Family

## 2017-11-13 ENCOUNTER — Encounter: Payer: Self-pay | Admitting: Family

## 2017-11-13 VITALS — BP 120/40 | HR 60 | Resp 18 | Ht 72.0 in | Wt 256.2 lb

## 2017-11-13 DIAGNOSIS — Z8262 Family history of osteoporosis: Secondary | ICD-10-CM | POA: Insufficient documentation

## 2017-11-13 DIAGNOSIS — Z7982 Long term (current) use of aspirin: Secondary | ICD-10-CM | POA: Insufficient documentation

## 2017-11-13 DIAGNOSIS — I890 Lymphedema, not elsewhere classified: Secondary | ICD-10-CM | POA: Insufficient documentation

## 2017-11-13 DIAGNOSIS — I482 Chronic atrial fibrillation: Secondary | ICD-10-CM | POA: Insufficient documentation

## 2017-11-13 DIAGNOSIS — Z8249 Family history of ischemic heart disease and other diseases of the circulatory system: Secondary | ICD-10-CM | POA: Diagnosis not present

## 2017-11-13 DIAGNOSIS — Z9889 Other specified postprocedural states: Secondary | ICD-10-CM | POA: Insufficient documentation

## 2017-11-13 DIAGNOSIS — Z7901 Long term (current) use of anticoagulants: Secondary | ICD-10-CM | POA: Insufficient documentation

## 2017-11-13 DIAGNOSIS — Z79890 Hormone replacement therapy (postmenopausal): Secondary | ICD-10-CM | POA: Insufficient documentation

## 2017-11-13 DIAGNOSIS — Z87891 Personal history of nicotine dependence: Secondary | ICD-10-CM | POA: Diagnosis not present

## 2017-11-13 DIAGNOSIS — Z823 Family history of stroke: Secondary | ICD-10-CM | POA: Diagnosis not present

## 2017-11-13 DIAGNOSIS — I130 Hypertensive heart and chronic kidney disease with heart failure and stage 1 through stage 4 chronic kidney disease, or unspecified chronic kidney disease: Secondary | ICD-10-CM | POA: Diagnosis not present

## 2017-11-13 DIAGNOSIS — Z09 Encounter for follow-up examination after completed treatment for conditions other than malignant neoplasm: Secondary | ICD-10-CM | POA: Insufficient documentation

## 2017-11-13 DIAGNOSIS — E039 Hypothyroidism, unspecified: Secondary | ICD-10-CM | POA: Diagnosis not present

## 2017-11-13 DIAGNOSIS — I2510 Atherosclerotic heart disease of native coronary artery without angina pectoris: Secondary | ICD-10-CM | POA: Diagnosis not present

## 2017-11-13 DIAGNOSIS — Z79899 Other long term (current) drug therapy: Secondary | ICD-10-CM | POA: Insufficient documentation

## 2017-11-13 DIAGNOSIS — I10 Essential (primary) hypertension: Secondary | ICD-10-CM

## 2017-11-13 DIAGNOSIS — N189 Chronic kidney disease, unspecified: Secondary | ICD-10-CM | POA: Insufficient documentation

## 2017-11-13 DIAGNOSIS — I4891 Unspecified atrial fibrillation: Secondary | ICD-10-CM

## 2017-11-13 DIAGNOSIS — I5032 Chronic diastolic (congestive) heart failure: Secondary | ICD-10-CM | POA: Insufficient documentation

## 2017-11-13 DIAGNOSIS — Z8546 Personal history of malignant neoplasm of prostate: Secondary | ICD-10-CM | POA: Insufficient documentation

## 2017-11-13 DIAGNOSIS — I495 Sick sinus syndrome: Secondary | ICD-10-CM | POA: Diagnosis not present

## 2017-11-13 DIAGNOSIS — I739 Peripheral vascular disease, unspecified: Secondary | ICD-10-CM | POA: Diagnosis not present

## 2017-11-13 DIAGNOSIS — I252 Old myocardial infarction: Secondary | ICD-10-CM | POA: Diagnosis not present

## 2017-11-13 NOTE — Patient Instructions (Signed)
Continue weighing daily and call for an overnight weight gain of > 2 pounds or a weekly weight gain of >5 pounds. 

## 2017-11-13 NOTE — Progress Notes (Signed)
Patient ID: Gary Grant, male    DOB: Jun 02, 1923, 83 y.o.   MRN: [id number]  HPI  Gary Grant is a 84 y/o male with a history of CAD, HTN, CKD, thyroid disease, chronic atrial fibrillation, GERD, PAD, COPD, MI, previous tobacco use and chronic heart failure.   Echo report from 04/17/17 reviewed and showed an EF of 55-60% along with mild AR/ Gary.   Was in the ED 08/28/17 due to pedal edema. Medications and he was released the same day.  He presents today for a follow-up visit with a chief complaint of minimal fatigue upon moderate exertion. He describes this as chronic in nature having been present for several years. He has associated shortness of breath and pedal edema along with this. He denies any difficulty sleeping, abdominal distention, palpitations, chest pain, cough, dizziness or weight gain.   Past Medical History:  Diagnosis Date  . CAD (coronary artery disease)   . CHF (congestive heart failure) ([place])   . Chronic a-fib ([place])   . CKD (chronic kidney disease)   . COPD (chronic obstructive pulmonary disease) ([place])   . GERD (gastroesophageal reflux disease)   . HTN (hypertension)   . Hypothyroidism   . MI (myocardial infarction) ([place])   . PAD (peripheral artery disease) ([place])   . Prostate cancer ([place])   . SSS (sick sinus syndrome) ([place])   . Venous insufficiency    Past Surgical History:  Procedure Laterality Date  . CARDIAC SURGERY     bypass  . EYE SURGERY    . FRACTURE SURGERY     Family History  Problem Relation Age of Onset  . Osteoporosis Mother   . Heart Problems Father   . Stroke Maternal Grandfather   . Stroke Paternal Grandfather   . Stroke Paternal Grandmother    Social History   Tobacco Use  . Smoking status: Former Research scientist (life sciences)  . Smokeless tobacco: Never Used  Substance Use Topics  . Alcohol use: No    Alcohol/week: 0.0 standard drinks   No Known Allergies  Prior to Admission medications   Medication Sig Start Date End Date Taking? Authorizing  Provider  acetaminophen (TYLENOL) 325 MG tablet Take 325-650 mg by mouth every 6 (six) hours as needed for moderate pain or fever.   Yes [provider]  allopurinol (ZYLOPRIM) 100 MG tablet Take 200 mg by mouth daily.    Yes [provider]  amiodarone (PACERONE) 400 MG tablet Take 1 tablet (400 mg total) by mouth daily. 04/23/17  Yes [name], MD  apixaban (ELIQUIS) 2.5 MG TABS tablet Take 2.5 mg by mouth 2 (two) times daily.   Yes [provider]  aspirin EC 81 MG tablet Take 81 mg by mouth daily.   Yes [provider]  Cholecalciferol 1000 units tablet Take 1,000 Units by mouth daily.    Yes [provider]  diltiazem (CARDIZEM CD) 240 MG 24 hr capsule Take 1 capsule (240 mg total) by mouth daily. 04/22/17  Yes [name], MD  furosemide (LASIX) 40 MG tablet Take 40 mg by mouth.   Yes [provider]  levothyroxine (SYNTHROID, LEVOTHROID) 75 MCG tablet Take 100 mcg by mouth daily before breakfast.    Yes [provider]  metoprolol tartrate (LOPRESSOR) 25 MG tablet Take 1 tablet (25 mg total) by mouth 2 (two) times daily. 04/22/17  Yes [name], MD  Multiple Vitamins-Minerals (MULTIVITAMIN ADULTS 50+) TABS Take 1 tablet by mouth daily.   Yes [provider]  pantoprazole (PROTONIX) 40 MG tablet Take 1 tablet (40 mg total) by mouth 2 (two) times daily before a meal. 05/07/15  Yes Theodoro Grist, MD  tamsulosin (FLOMAX) 0.4 MG CAPS capsule Take 0.4 mg by mouth daily.   Yes [provider]    Review of Systems  Constitutional: Positive for fatigue. Negative for appetite change.  HENT: Positive for hearing loss. Negative for congestion, postnasal drip and sore throat.   Eyes: Negative.   Respiratory: Positive for shortness of breath. Negative for cough.   Cardiovascular: Positive for leg swelling. Negative for chest pain and palpitations.  Gastrointestinal: Negative for abdominal distention and abdominal pain.   Endocrine: Negative.   Genitourinary: Negative.   Musculoskeletal: Negative for back pain and neck pain.  Skin: Negative.   Allergic/Immunologic: Negative.   Neurological: Negative for dizziness and light-headedness.  Hematological: Negative for adenopathy. Does not bruise/bleed easily.  Psychiatric/Behavioral: Negative for dysphoric mood and sleep disturbance (sleeping on 1 pillow). The patient is not nervous/anxious.    Vitals:   11/13/17 1300  BP: (!) 120/40  Pulse: 60  Resp: 18  SpO2: 100%  Weight: 256 lb 4 oz (116.2 kg)  Height: 6' (1.829 m)   Wt Readings from Last 3 Encounters:  11/13/17 256 lb 4 oz (116.2 kg)  09/24/17 255 lb 6 oz (115.8 kg)  08/28/17 250 lb (113.4 kg)   Lab Results  Component Value Date   CREATININE 2.69 (H) 08/28/2017   CREATININE 2.74 (H) 04/22/2017   CREATININE 2.84 (H) 04/21/2017    Physical Exam  Constitutional: He is oriented to person, place, and time. He appears well-developed and well-nourished.  HENT:  Head: Normocephalic and atraumatic.  Right Ear: Decreased hearing is noted.  Left Ear: Decreased hearing is noted.  Neck: Normal range of motion. Neck supple. No thyromegaly present.  Cardiovascular: Normal rate and regular rhythm.  Pulmonary/Chest: Effort normal. No respiratory distress. He has no wheezes. He has no rales.  Abdominal: Soft. He exhibits no distension. There is no tenderness.  Musculoskeletal: He exhibits edema (2+ pitting R>L). He exhibits no tenderness.  Neurological: He is alert and oriented to person, place, and time.  Skin: Skin is warm and dry.  Psychiatric: He has a normal mood and affect. His behavior is normal. Thought content normal.  Nursing note and vitals reviewed.   Assessment & Plan:  1: Chronic heart failure with preserved ejection fraction- - NYHA class II - mildly fluid overloaded today with pedal edema although stable - weighing daily and he was instructed to call for an overnight weight gain of  >2 pounds or a weekly weight gain of >5 pounds - weight stable from last time he was here - does add "little salt" to a few things like tomatoes. Discussed the importance of not adding salt to anything. Does live alone but family grocery shops for him and reading food labels was discussed with family (patient can not read).  - drinking ~ 3 bottles of water daily - BNP 04/17/17 was 417.0  2: Atrial fibrillation- - currently rate controlled - saw cardiology Nehemiah Massed) 10/24/17 - taking amiodarone, apixaban, dilitiazem & metoprolol  3: HTN- - BP looks good today - goes to [facility] - BMP from 08/28/17 reviewed and showed sodium 143, potassium 4.2 and GFR 22  4: Lymphedema- - stage 2 - limited in ability to exercise due to symptoms - does elevate his legs when he's sitting for long periods of time - failed compression  sock therapy because he is unable to put them on - lymphapress compress boot referral placed  Medication bottles reviewed.  Return in 2 months or sooner for any questions/problems before then.

## 2018-01-15 ENCOUNTER — Telehealth: Payer: Self-pay | Admitting: Family

## 2018-01-15 ENCOUNTER — Ambulatory Visit: Payer: Medicare HMO | Admitting: Family

## 2018-01-15 NOTE — Telephone Encounter (Signed)
Patient did not show for his [facility] appointment on 01/15/18. Will attempt to reschedule.

## 2018-09-10 ENCOUNTER — Emergency Department
Admission: EM | Admit: 2018-09-10 | Discharge: 2018-09-10 | Disposition: A | Discharge status: Home / Self Care | Payer: Medicare HMO | Attending: Emergency Medicine | Admitting: Emergency Medicine

## 2018-09-10 ENCOUNTER — Emergency Department: Payer: Medicare HMO

## 2018-09-10 ENCOUNTER — Other Ambulatory Visit: Payer: Self-pay

## 2018-09-10 ENCOUNTER — Encounter: Payer: Self-pay | Admitting: *Deleted

## 2018-09-10 DIAGNOSIS — Z79899 Other long term (current) drug therapy: Secondary | ICD-10-CM | POA: Insufficient documentation

## 2018-09-10 DIAGNOSIS — Z8546 Personal history of malignant neoplasm of prostate: Secondary | ICD-10-CM | POA: Diagnosis not present

## 2018-09-10 DIAGNOSIS — I5032 Chronic diastolic (congestive) heart failure: Secondary | ICD-10-CM | POA: Diagnosis not present

## 2018-09-10 DIAGNOSIS — E039 Hypothyroidism, unspecified: Secondary | ICD-10-CM | POA: Insufficient documentation

## 2018-09-10 DIAGNOSIS — Z7901 Long term (current) use of anticoagulants: Secondary | ICD-10-CM | POA: Diagnosis not present

## 2018-09-10 DIAGNOSIS — D649 Anemia, unspecified: Secondary | ICD-10-CM

## 2018-09-10 DIAGNOSIS — J449 Chronic obstructive pulmonary disease, unspecified: Secondary | ICD-10-CM | POA: Diagnosis not present

## 2018-09-10 DIAGNOSIS — R600 Localized edema: Secondary | ICD-10-CM

## 2018-09-10 DIAGNOSIS — R609 Edema, unspecified: Secondary | ICD-10-CM | POA: Insufficient documentation

## 2018-09-10 DIAGNOSIS — N189 Chronic kidney disease, unspecified: Secondary | ICD-10-CM | POA: Diagnosis not present

## 2018-09-10 DIAGNOSIS — I130 Hypertensive heart and chronic kidney disease with heart failure and stage 1 through stage 4 chronic kidney disease, or unspecified chronic kidney disease: Secondary | ICD-10-CM | POA: Diagnosis not present

## 2018-09-10 DIAGNOSIS — M7989 Other specified soft tissue disorders: Secondary | ICD-10-CM | POA: Diagnosis present

## 2018-09-10 DIAGNOSIS — Z87891 Personal history of nicotine dependence: Secondary | ICD-10-CM | POA: Insufficient documentation

## 2018-09-10 DIAGNOSIS — I252 Old myocardial infarction: Secondary | ICD-10-CM | POA: Diagnosis not present

## 2018-09-10 LAB — BASIC METABOLIC PANEL
Anion gap: 8 (ref 5–15)
BUN: 42 mg/dL — ABNORMAL HIGH (ref 8–23)
CO2: 28 mmol/L (ref 22–32)
Calcium: 8.3 mg/dL — ABNORMAL LOW (ref 8.9–10.3)
Chloride: 100 mmol/L (ref 98–111)
Creatinine, Ser: 2.75 mg/dL — ABNORMAL HIGH (ref 0.61–1.24)
GFR calc Af Amer: 22 mL/min — ABNORMAL LOW (ref >60)
GFR calc non Af Amer: 19 mL/min — ABNORMAL LOW (ref >60)
Glucose, Bld: 114 mg/dL — ABNORMAL HIGH (ref 70–99)
Potassium: 3.9 mmol/L (ref 3.5–5.1)
Sodium: 136 mmol/L (ref 135–145)

## 2018-09-10 LAB — CBC
HCT: 29.8 % — ABNORMAL LOW (ref 39.0–52.0)
Hemoglobin: 9 g/dL — ABNORMAL LOW (ref 13.0–17.0)
MCH: 28.6 pg (ref 26.0–34.0)
MCHC: 30.2 g/dL (ref 30.0–36.0)
MCV: 94.6 fL (ref 80.0–100.0)
Platelets: 160 10*3/uL (ref 150–400)
RBC: 3.15 MIL/uL — ABNORMAL LOW (ref 4.22–5.81)
RDW: 15.7 % — ABNORMAL HIGH (ref 11.5–15.5)
WBC: 6.2 10*3/uL (ref 4.0–10.5)
nRBC: 0 % (ref 0.0–0.2)

## 2018-09-10 MED ORDER — FUROSEMIDE 10 MG/ML IJ SOLN
60.0000 mg | Freq: Once | INTRAMUSCULAR | Status: AC
  Administered 2018-09-10: 19:00:00 60 mg via INTRAVENOUS
  Filled 2018-09-10: qty 8

## 2018-09-10 NOTE — ED Notes (Signed)
Hemoccult negative.

## 2018-09-10 NOTE — ED Notes (Signed)
ED Provider at bedside. 

## 2018-09-10 NOTE — ED Notes (Signed)
Pt reports bilat leg swelling for "some time"- reports not being able to stand for long periods of time and cannot walk more than about 10 feet- pt also has swelling in his right hand that he states he thinks may be related to a fall

## 2018-09-10 NOTE — ED Notes (Signed)
Family updated.

## 2018-09-10 NOTE — Discharge Instructions (Addendum)
Please seek medical attention for any high fevers, chest pain, shortness of breath, change in behavior, persistent vomiting, bloody stool or any other new or concerning symptoms.  

## 2018-09-10 NOTE — ED Provider Notes (Signed)
[facility] Emergency Department Provider Note  ____________________________________________   I have reviewed the triage vital signs and the nursing notes.   HISTORY  Chief Complaint Leg Swelling and Congestive Heart Failure   History limited by: Not Limited   HPI Gary Grant is a 85 y.o. male who presents to the emergency department today because of concern for increasing leg swelling and discomfort. The patient states that he has had problems with leg swelling and pain for years. For the past few days he has noticed it has gotten worse. The pain will cause him to have a hard time ambulating. The patient states that he also has felt some shortness of breath. Denies any fevers. Denies any chest pain. Denies any bloody stool or black or tarry stool.    Records reviewed. Per medical record review patient has a history of CAD, CHF.   Past Medical History:  Diagnosis Date  . CAD (coronary artery disease)   . CHF (congestive heart failure) ([place])   . Chronic a-fib   . CKD (chronic kidney disease)   . COPD (chronic obstructive pulmonary disease) ([place])   . GERD (gastroesophageal reflux disease)   . HTN (hypertension)   . Hypothyroidism   . MI (myocardial infarction) ([place])   . PAD (peripheral artery disease) ([place])   . Prostate cancer ([place])   . SSS (sick sinus syndrome) ([place])   . Venous insufficiency     Patient Active Problem List   Diagnosis Date Noted  . Chronic diastolic heart failure ([place]) 09/25/2017  . HTN (hypertension) 09/25/2017  . Lymphedema 09/25/2017  . Atrial fibrillation with RVR ([place]) 04/17/2017  . NSTEMI (non-ST elevated myocardial infarction) ([place]) 12/20/2015  . Leg weakness, bilateral 11/29/2015  . Falls 11/29/2015  . Degenerative disc disease, lumbar 11/29/2015  . Back pain 11/29/2015  . Bradycardia, drug induced 11/29/2015  . Dehydration 11/29/2015  . Anemia 11/29/2015  . Hypothyroidism 11/29/2015  . Weakness 11/26/2015  .  Nephrolithiasis 05/07/2015  . Constipation 05/07/2015    Past Surgical History:  Procedure Laterality Date  . CARDIAC SURGERY     bypass  . EYE SURGERY    . FRACTURE SURGERY      Prior to Admission medications   Medication Sig Start Date End Date Taking? Authorizing Provider  acetaminophen (TYLENOL) 325 MG tablet Take 325-650 mg by mouth every 6 (six) hours as needed for moderate pain or fever.    [provider]  allopurinol (ZYLOPRIM) 100 MG tablet Take 200 mg by mouth daily.     [provider]  amiodarone (PACERONE) 400 MG tablet Take 1 tablet (400 mg total) by mouth daily. 04/23/17   [name], MD  apixaban (ELIQUIS) 2.5 MG TABS tablet Take 2.5 mg by mouth 2 (two) times daily.    [provider]  aspirin EC 81 MG tablet Take 81 mg by mouth daily.    [provider]  Cholecalciferol 1000 units tablet Take 1,000 Units by mouth daily.     [provider]  diltiazem (CARDIZEM CD) 240 MG 24 hr capsule Take 1 capsule (240 mg total) by mouth daily. 04/22/17   [name], MD  furosemide (LASIX) 40 MG tablet Take 40 mg by mouth.    [provider]  levothyroxine (SYNTHROID, LEVOTHROID) 75 MCG tablet Take 100 mcg by mouth daily before breakfast.     [provider]  metoprolol tartrate (LOPRESSOR) 25 MG tablet Take 1 tablet (25 mg total) by mouth 2 (two) times  daily. 04/22/17   [name], MD  Multiple Vitamins-Minerals (MULTIVITAMIN ADULTS 50+) TABS Take 1 tablet by mouth daily.    [provider]  pantoprazole (PROTONIX) 40 MG tablet Take 1 tablet (40 mg total) by mouth 2 (two) times daily before a meal. 05/07/15   Theodoro Grist, MD  tamsulosin (FLOMAX) 0.4 MG CAPS capsule Take 0.4 mg by mouth daily.    [provider]    Allergies Patient has no known allergies.  Family History  Problem Relation Age of Onset  . Osteoporosis Mother   . Heart Problems Father   . Stroke Maternal Grandfather   . Stroke  Paternal Grandfather   . Stroke Paternal Grandmother     Social History Social History   Tobacco Use  . Smoking status: Former Research scientist (life sciences)  . Smokeless tobacco: Never Used  Substance Use Topics  . Alcohol use: No    Alcohol/week: 0.0 standard drinks  . Drug use: No    Review of Systems Constitutional: No fever/chills Eyes: No visual changes. ENT: No sore throat. Cardiovascular: Denies chest pain. Respiratory: Positive for shortness of breath. Gastrointestinal: No abdominal pain.  No nausea, no vomiting.  No diarrhea.   Genitourinary: Negative for dysuria. Musculoskeletal: Positive for leg swelling and pain. Skin: Negative for rash. Neurological: Negative for headaches, focal weakness or numbness.  ____________________________________________   PHYSICAL EXAM:  VITAL SIGNS: ED Triage Vitals [09/10/18 1416]  Enc Vitals Group     BP 131/66     Pulse Rate (!) 54     Resp 16     Temp 99.3 F (37.4 C)     Temp Source Oral     SpO2 96 %     Weight      Height      Head Circumference      Peak Flow      Pain Score 0   Constitutional: Alert and oriented.  Eyes: Conjunctivae are normal.  ENT      Head: Normocephalic and atraumatic.      Nose: No congestion/rhinnorhea.      Mouth/Throat: Mucous membranes are moist.      Neck: No stridor. Hematological/Lymphatic/Immunilogical: No cervical lymphadenopathy. Cardiovascular: Normal rate, regular rhythm.  No murmurs, rubs, or gallops.  Respiratory: Normal respiratory effort without tachypnea nor retractions. Breath sounds are clear and equal bilaterally. No wheezes/rales/rhonchi. Gastrointestinal: Soft and non tender. No rebound. No guarding.  Genitourinary: Deferred Musculoskeletal: Normal range of motion in all extremities. 2+ bilateral edema.  Neurologic:  Normal speech and language. No gross focal neurologic deficits are appreciated.  Skin:  Skin is warm, dry and intact. No rash noted. Psychiatric: Mood and affect are  normal. Speech and behavior are normal. Patient exhibits appropriate insight and judgment.  ____________________________________________    LABS (pertinent positives/negatives)  BMP na 136, k 3.9, glu 114, cr 2.75 CBC wbc 6.2, hgb 9.0, plt 160  ____________________________________________   EKG  I, Nance Pear, attending physician, personally viewed and interpreted this EKG  EKG Time: 1419 Rate: 53 Rhythm: sinus bradycardia with 1st degree av block Axis: normal Intervals: qtc 456 QRS: narrow ST changes: no st elevation Impression: abnormal ekg  ____________________________________________    RADIOLOGY  CXR No acute disease  ____________________________________________   PROCEDURES  Procedures  ____________________________________________   INITIAL IMPRESSION / ASSESSMENT AND PLAN / ED COURSE  Pertinent labs & imaging results that were available during my care of the patient were reviewed by me and considered in my medical decision making (see chart  for details).   Patient presented to the emergency department today because of concern for lower extremity edema. On exam he does have lower extremity edema. Also complaining of some shortness of breath. Patient was found to be anemic here, last blood work roughly 1 year ago. GUIAC negative. Discussed this finding with the patient. Given lasix IV  Here with some output. Patient did want discharge with I think is reasonable. Did discuss with patient importance of follow up with primary care.    ____________________________________________   FINAL CLINICAL IMPRESSION(S) / ED DIAGNOSES  Final diagnoses:  Peripheral edema  Anemia, unspecified type     Note: This dictation was prepared with Dragon dictation. Any transcriptional errors that result from this process are unintentional     Nance Pear, MD 09/10/18 2057

## 2018-09-10 NOTE — ED Triage Notes (Addendum)
Pt to ED with bilateral leg swelling and left hand swelling. Intermittent SOB with hx of CHF. Pt verbalized having been taking his medications as prescribed. Pt was given an extra 20 mg PO lasix Friday. Saturday and Sunday with no change in swelling.

## 2018-09-13 NOTE — Progress Notes (Deleted)
Patient ID: Gary Grant, male    DOB: Oct 16, 1923, 85 y.o.   MRN: [id number]  HPI  Gary Grant is a 85 y/o male with a history of CAD, HTN, CKD, thyroid disease, chronic atrial fibrillation, GERD, PAD, COPD, MI, previous tobacco use and chronic heart failure.   Echo report from 04/17/17 reviewed and showed an EF of 55-60% along with mild AR/ Gary.   Was in the ED 09/10/2018 due to pedal edema. IV lasix given and Gary Grant was released.   Gary Grant presents today for a follow-up visit with a chief complaint of   Past Medical History:  Diagnosis Date  . CAD (coronary artery disease)   . CHF (congestive heart failure) ([place])   . Chronic a-fib   . CKD (chronic kidney disease)   . COPD (chronic obstructive pulmonary disease) ([place])   . GERD (gastroesophageal reflux disease)   . HTN (hypertension)   . Hypothyroidism   . MI (myocardial infarction) ([place])   . PAD (peripheral artery disease) ([place])   . Prostate cancer ([place])   . SSS (sick sinus syndrome) ([place])   . Venous insufficiency    Past Surgical History:  Procedure Laterality Date  . CARDIAC SURGERY     bypass  . EYE SURGERY    . FRACTURE SURGERY     Family History  Problem Relation Age of Onset  . Osteoporosis Mother   . Heart Problems Father   . Stroke Maternal Grandfather   . Stroke Paternal Grandfather   . Stroke Paternal Grandmother    Social History   Tobacco Use  . Smoking status: Former Research scientist (life sciences)  . Smokeless tobacco: Never Used  Substance Use Topics  . Alcohol use: No    Alcohol/week: 0.0 standard drinks   No Known Allergies    Review of Systems  Constitutional: Positive for fatigue. Negative for appetite change.  HENT: Positive for hearing loss. Negative for congestion, postnasal drip and sore throat.   Eyes: Negative.   Respiratory: Positive for shortness of breath. Negative for cough.   Cardiovascular: Positive for leg swelling. Negative for chest pain and palpitations.  Gastrointestinal: Negative for abdominal  distention and abdominal pain.  Endocrine: Negative.   Genitourinary: Negative.   Musculoskeletal: Negative for back pain and neck pain.  Skin: Negative.   Allergic/Immunologic: Negative.   Neurological: Negative for dizziness and light-headedness.  Hematological: Negative for adenopathy. Does not bruise/bleed easily.  Psychiatric/Behavioral: Negative for dysphoric mood and sleep disturbance (sleeping on 1 pillow). The patient is not nervous/anxious.      Physical Exam Vitals signs and nursing note reviewed.  Constitutional:      Appearance: Gary Grant is well-developed.  HENT:     Head: Normocephalic and atraumatic.     Right Ear: Decreased hearing noted.     Left Ear: Decreased hearing noted.  Neck:     Musculoskeletal: Normal range of motion and neck supple.     Thyroid: No thyromegaly.  Cardiovascular:     Rate and Rhythm: Normal rate and regular rhythm.  Pulmonary:     Effort: Pulmonary effort is normal. No respiratory distress.     Breath sounds: No wheezing or rales.  Abdominal:     General: There is no distension.     Palpations: Abdomen is soft.     Tenderness: There is no abdominal tenderness.  Musculoskeletal:        General: No tenderness.  Skin:    General: Skin is warm and dry.  Neurological:  Mental Status: Gary Grant is alert and oriented to person, place, and time.  Psychiatric:        Behavior: Behavior normal.        Thought Content: Thought content normal.     Assessment & Plan:  1: Chronic heart failure with preserved ejection fraction- - NYHA class II - mildly fluid overloaded today with pedal edema although stable - weighing daily and Gary Grant was instructed to call for an overnight weight gain of >2 pounds or a weekly weight gain of >5 pounds - weight 256.4 from last visit here 10 months ago - does add "little salt" to a few things like tomatoes. Discussed the importance of not adding salt to anything. Does live alone but family grocery shops for him and  reading food labels was discussed with family (patient can not read).  - drinking ~ 3 bottles of water daily - BNP 04/17/17 was 417.0  2: Atrial fibrillation- - currently rate controlled - saw cardiology Nehemiah Massed) 10/24/17 - taking amiodarone, apixaban, dilitiazem & metoprolol  3: HTN- - BP looks good today - goes to [facility] for primary care - BMP from 09/10/2018 reviewed and showed sodium 136, potassium 3.9, creatinine 2.75 and GFR 22  4: Lymphedema- - stage 2 - limited in ability to exercise due to symptoms - does elevate his legs when Gary Grant's sitting for long periods of time - failed compression sock therapy because Gary Grant is unable to put them on - lymphapress compress boot referral placed  Medication bottles reviewed.

## 2018-09-16 ENCOUNTER — Ambulatory Visit: Payer: Medicare HMO | Admitting: Family

## 2018-11-01 ENCOUNTER — Ambulatory Visit: Payer: Self-pay | Admitting: Urology

## 2019-12-02 ENCOUNTER — Inpatient Hospital Stay
Admission: EM | Admit: 2019-12-02 | Discharge: 2019-12-29 | DRG: 682 | Disposition: E | Payer: Medicare HMO | Attending: Internal Medicine | Admitting: Internal Medicine

## 2019-12-02 ENCOUNTER — Emergency Department: Payer: Medicare HMO

## 2019-12-02 ENCOUNTER — Other Ambulatory Visit: Payer: Self-pay

## 2019-12-02 DIAGNOSIS — I480 Paroxysmal atrial fibrillation: Secondary | ICD-10-CM

## 2019-12-02 DIAGNOSIS — Z79899 Other long term (current) drug therapy: Secondary | ICD-10-CM

## 2019-12-02 DIAGNOSIS — N401 Enlarged prostate with lower urinary tract symptoms: Secondary | ICD-10-CM | POA: Diagnosis present

## 2019-12-02 DIAGNOSIS — L89312 Pressure ulcer of right buttock, stage 2: Secondary | ICD-10-CM | POA: Diagnosis present

## 2019-12-02 DIAGNOSIS — J449 Chronic obstructive pulmonary disease, unspecified: Secondary | ICD-10-CM | POA: Diagnosis present

## 2019-12-02 DIAGNOSIS — I132 Hypertensive heart and chronic kidney disease with heart failure and with stage 5 chronic kidney disease, or end stage renal disease: Secondary | ICD-10-CM | POA: Diagnosis present

## 2019-12-02 DIAGNOSIS — Z515 Encounter for palliative care: Secondary | ICD-10-CM | POA: Diagnosis not present

## 2019-12-02 DIAGNOSIS — W19XXXA Unspecified fall, initial encounter: Secondary | ICD-10-CM | POA: Diagnosis present

## 2019-12-02 DIAGNOSIS — Z7982 Long term (current) use of aspirin: Secondary | ICD-10-CM

## 2019-12-02 DIAGNOSIS — B9620 Unspecified Escherichia coli [E. coli] as the cause of diseases classified elsewhere: Secondary | ICD-10-CM | POA: Diagnosis present

## 2019-12-02 DIAGNOSIS — G9341 Metabolic encephalopathy: Secondary | ICD-10-CM | POA: Diagnosis present

## 2019-12-02 DIAGNOSIS — Z951 Presence of aortocoronary bypass graft: Secondary | ICD-10-CM

## 2019-12-02 DIAGNOSIS — D6859 Other primary thrombophilia: Secondary | ICD-10-CM | POA: Diagnosis present

## 2019-12-02 DIAGNOSIS — R7989 Other specified abnormal findings of blood chemistry: Secondary | ICD-10-CM

## 2019-12-02 DIAGNOSIS — Z8262 Family history of osteoporosis: Secondary | ICD-10-CM

## 2019-12-02 DIAGNOSIS — N184 Chronic kidney disease, stage 4 (severe): Secondary | ICD-10-CM

## 2019-12-02 DIAGNOSIS — Z823 Family history of stroke: Secondary | ICD-10-CM

## 2019-12-02 DIAGNOSIS — I5032 Chronic diastolic (congestive) heart failure: Secondary | ICD-10-CM | POA: Diagnosis present

## 2019-12-02 DIAGNOSIS — E871 Hypo-osmolality and hyponatremia: Secondary | ICD-10-CM | POA: Diagnosis present

## 2019-12-02 DIAGNOSIS — K759 Inflammatory liver disease, unspecified: Secondary | ICD-10-CM

## 2019-12-02 DIAGNOSIS — Z20822 Contact with and (suspected) exposure to covid-19: Secondary | ICD-10-CM | POA: Diagnosis present

## 2019-12-02 DIAGNOSIS — I959 Hypotension, unspecified: Secondary | ICD-10-CM | POA: Diagnosis present

## 2019-12-02 DIAGNOSIS — R748 Abnormal levels of other serum enzymes: Secondary | ICD-10-CM | POA: Diagnosis present

## 2019-12-02 DIAGNOSIS — R531 Weakness: Secondary | ICD-10-CM

## 2019-12-02 DIAGNOSIS — D6489 Other specified anemias: Secondary | ICD-10-CM | POA: Diagnosis present

## 2019-12-02 DIAGNOSIS — I2510 Atherosclerotic heart disease of native coronary artery without angina pectoris: Secondary | ICD-10-CM | POA: Diagnosis present

## 2019-12-02 DIAGNOSIS — I739 Peripheral vascular disease, unspecified: Secondary | ICD-10-CM | POA: Diagnosis present

## 2019-12-02 DIAGNOSIS — Z66 Do not resuscitate: Secondary | ICD-10-CM | POA: Diagnosis present

## 2019-12-02 DIAGNOSIS — Z7189 Other specified counseling: Secondary | ICD-10-CM | POA: Diagnosis not present

## 2019-12-02 DIAGNOSIS — E860 Dehydration: Secondary | ICD-10-CM | POA: Diagnosis present

## 2019-12-02 DIAGNOSIS — R338 Other retention of urine: Secondary | ICD-10-CM

## 2019-12-02 DIAGNOSIS — N185 Chronic kidney disease, stage 5: Secondary | ICD-10-CM | POA: Diagnosis present

## 2019-12-02 DIAGNOSIS — K219 Gastro-esophageal reflux disease without esophagitis: Secondary | ICD-10-CM | POA: Diagnosis present

## 2019-12-02 DIAGNOSIS — I10 Essential (primary) hypertension: Secondary | ICD-10-CM | POA: Diagnosis present

## 2019-12-02 DIAGNOSIS — N189 Chronic kidney disease, unspecified: Principal | ICD-10-CM

## 2019-12-02 DIAGNOSIS — N390 Urinary tract infection, site not specified: Secondary | ICD-10-CM | POA: Diagnosis present

## 2019-12-02 DIAGNOSIS — E039 Hypothyroidism, unspecified: Secondary | ICD-10-CM | POA: Diagnosis present

## 2019-12-02 DIAGNOSIS — N19 Unspecified kidney failure: Secondary | ICD-10-CM

## 2019-12-02 DIAGNOSIS — Z87891 Personal history of nicotine dependence: Secondary | ICD-10-CM

## 2019-12-02 DIAGNOSIS — L89322 Pressure ulcer of left buttock, stage 2: Secondary | ICD-10-CM | POA: Diagnosis present

## 2019-12-02 DIAGNOSIS — Z79890 Hormone replacement therapy (postmenopausal): Secondary | ICD-10-CM

## 2019-12-02 DIAGNOSIS — D631 Anemia in chronic kidney disease: Secondary | ICD-10-CM | POA: Diagnosis present

## 2019-12-02 DIAGNOSIS — R296 Repeated falls: Secondary | ICD-10-CM | POA: Diagnosis present

## 2019-12-02 DIAGNOSIS — Z09 Encounter for follow-up examination after completed treatment for conditions other than malignant neoplasm: Secondary | ICD-10-CM

## 2019-12-02 DIAGNOSIS — N289 Disorder of kidney and ureter, unspecified: Secondary | ICD-10-CM | POA: Diagnosis not present

## 2019-12-02 DIAGNOSIS — L8990 Pressure ulcer of unspecified site, unspecified stage: Secondary | ICD-10-CM | POA: Insufficient documentation

## 2019-12-02 DIAGNOSIS — I252 Old myocardial infarction: Secondary | ICD-10-CM

## 2019-12-02 DIAGNOSIS — Z8546 Personal history of malignant neoplasm of prostate: Secondary | ICD-10-CM

## 2019-12-02 DIAGNOSIS — Z7901 Long term (current) use of anticoagulants: Secondary | ICD-10-CM

## 2019-12-02 DIAGNOSIS — I509 Heart failure, unspecified: Secondary | ICD-10-CM

## 2019-12-02 DIAGNOSIS — N179 Acute kidney failure, unspecified: Secondary | ICD-10-CM

## 2019-12-02 DIAGNOSIS — R7401 Elevation of levels of liver transaminase levels: Secondary | ICD-10-CM | POA: Diagnosis present

## 2019-12-02 LAB — CBC WITH DIFFERENTIAL/PLATELET
Abs Immature Granulocytes: 0.17 10*3/uL — ABNORMAL HIGH (ref 0.00–0.07)
Basophils Absolute: 0 10*3/uL (ref 0.0–0.1)
Basophils Relative: 0 %
Eosinophils Absolute: 0.3 10*3/uL (ref 0.0–0.5)
Eosinophils Relative: 3 %
HCT: 26.5 % — ABNORMAL LOW (ref 39.0–52.0)
Hemoglobin: 7.6 g/dL — ABNORMAL LOW (ref 13.0–17.0)
Immature Granulocytes: 2 %
Lymphocytes Relative: 4 %
Lymphs Abs: 0.3 10*3/uL — ABNORMAL LOW (ref 0.7–4.0)
MCH: 19.6 pg — ABNORMAL LOW (ref 26.0–34.0)
MCHC: 28.7 g/dL — ABNORMAL LOW (ref 30.0–36.0)
MCV: 68.3 fL — ABNORMAL LOW (ref 80.0–100.0)
Monocytes Absolute: 0.8 10*3/uL (ref 0.1–1.0)
Monocytes Relative: 9 %
Neutro Abs: 6.8 10*3/uL (ref 1.7–7.7)
Neutrophils Relative %: 82 %
Platelets: 296 10*3/uL (ref 150–400)
RBC: 3.88 MIL/uL — ABNORMAL LOW (ref 4.22–5.81)
RDW: 20.8 % — ABNORMAL HIGH (ref 11.5–15.5)
Smear Review: NORMAL
WBC: 8.3 10*3/uL (ref 4.0–10.5)
nRBC: 0.5 % — ABNORMAL HIGH (ref 0.0–0.2)

## 2019-12-02 LAB — COMPREHENSIVE METABOLIC PANEL
ALT: 162 U/L — ABNORMAL HIGH (ref 0–44)
AST: 128 U/L — ABNORMAL HIGH (ref 15–41)
Albumin: 2.8 g/dL — ABNORMAL LOW (ref 3.5–5.0)
Alkaline Phosphatase: 108 U/L (ref 38–126)
Anion gap: 10 (ref 5–15)
BUN: 119 mg/dL — ABNORMAL HIGH (ref 8–23)
CO2: 24 mmol/L (ref 22–32)
Calcium: 8.1 mg/dL — ABNORMAL LOW (ref 8.9–10.3)
Chloride: 96 mmol/L — ABNORMAL LOW (ref 98–111)
Creatinine, Ser: 4.84 mg/dL — ABNORMAL HIGH (ref 0.61–1.24)
GFR calc non Af Amer: 10 mL/min — ABNORMAL LOW (ref >60)
Glucose, Bld: 131 mg/dL — ABNORMAL HIGH (ref 70–99)
Potassium: 4.4 mmol/L (ref 3.5–5.1)
Sodium: 130 mmol/L — ABNORMAL LOW (ref 135–145)
Total Bilirubin: 0.8 mg/dL (ref 0.3–1.2)
Total Protein: 5.8 g/dL — ABNORMAL LOW (ref 6.5–8.1)

## 2019-12-02 LAB — URINALYSIS, COMPLETE (UACMP) WITH MICROSCOPIC
Bilirubin Urine: NEGATIVE
Glucose, UA: NEGATIVE mg/dL
Hgb urine dipstick: NEGATIVE
Ketones, ur: NEGATIVE mg/dL
Nitrite: NEGATIVE
Protein, ur: NEGATIVE mg/dL
Specific Gravity, Urine: 1.01 (ref 1.005–1.030)
pH: 5 (ref 5.0–8.0)

## 2019-12-02 LAB — BRAIN NATRIURETIC PEPTIDE: B Natriuretic Peptide: 131.2 pg/mL — ABNORMAL HIGH (ref 0.0–100.0)

## 2019-12-02 LAB — TROPONIN I (HIGH SENSITIVITY): Troponin I (High Sensitivity): 19 ng/L — ABNORMAL HIGH (ref <18)

## 2019-12-02 MED ORDER — ONDANSETRON HCL 4 MG/2ML IJ SOLN: 4.0000 mg | Freq: Four times a day (QID) | INTRAMUSCULAR | Status: DC | PRN

## 2019-12-02 MED ORDER — ACETAMINOPHEN 650 MG RE SUPP: 650.0000 mg | Freq: Four times a day (QID) | RECTAL | Status: DC | PRN

## 2019-12-02 MED ORDER — SODIUM CHLORIDE 0.9 % IV BOLUS
500.0000 mL | Freq: Once | INTRAVENOUS | Status: AC
  Administered 2019-12-03: 500 mL via INTRAVENOUS

## 2019-12-02 MED ORDER — ACETAMINOPHEN 325 MG PO TABS: 650.0000 mg | ORAL_TABLET | Freq: Four times a day (QID) | ORAL | Status: DC | PRN

## 2019-12-02 MED ORDER — ONDANSETRON HCL 4 MG PO TABS: 4.0000 mg | ORAL_TABLET | Freq: Four times a day (QID) | ORAL | Status: DC | PRN

## 2019-12-02 MED ORDER — SODIUM CHLORIDE 0.9 % IV SOLN: INTRAVENOUS | Status: AC

## 2019-12-02 NOTE — ED Triage Notes (Addendum)
Pt arrived via POV with daughter, reports recent fall, pt states he feels like his bladder is overflowing as well.  Pt has generalized swelling to upper and lower extremities.  Per daughter, pt was seen at PCP on Tuesday, was told Thursday he was anemic and kidney function was worsening. Pt also had recent change in medications for BP and thyroid.   Pt takes furosemide per daughter and has been taking daily.  Pt has hx of HF, daughter states it has not been a problem since his surgery.    Pt has had multiple falls over the past week.

## 2019-12-02 NOTE — ED Provider Notes (Signed)
[facility] Emergency Department Provider Note  ____________________________________________  Time seen: Approximately 9:19 PM  I have reviewed the triage vital signs and the nursing notes.   HISTORY  Chief Complaint Leg Swelling and Weakness    HPI Gary Grant is a 86 y.o. male with a history of CAD CHF COPD CKD hypertension who comes ED complaining of urinary frequency and incontinence.  Has been taking furosemide daily for his CHF.  Denies any significant shortness of breath or dyspnea on exertion.  He is currently lying supine in the treatment bed, breathing comfortably.  Has chronic bilateral lower extremity edema which she says is better than usual currently.   His main concern today is about the urinary symptoms.  Family member at bedside is worried he might have a UTI.  Has had a recent fall, but denies head injury or acute pain.     Past Medical History:  Diagnosis Date  . CAD (coronary artery disease)   . CHF (congestive heart failure) ([place])   . Chronic a-fib   . CKD (chronic kidney disease)   . COPD (chronic obstructive pulmonary disease) ([place])   . GERD (gastroesophageal reflux disease)   . HTN (hypertension)   . Hypothyroidism   . MI (myocardial infarction) ([place])   . PAD (peripheral artery disease) ([place])   . Prostate cancer ([place])   . SSS (sick sinus syndrome) ([place])   . Venous insufficiency      Patient Active Problem List   Diagnosis Date Noted  . CKD (chronic kidney disease), stage IV ([place]) 12/14/2019  . Acute urinary retention 12/01/2019  . Hypotension 12/25/2019  . Chronic diastolic heart failure ([place]) 09/25/2017  . HTN (hypertension) 09/25/2017  . Lymphedema 09/25/2017  . Atrial fibrillation with RVR ([place]) 04/17/2017  . NSTEMI (non-ST elevated myocardial infarction) ([place]) 12/20/2015  . Leg weakness, bilateral 11/29/2015  . Falls 11/29/2015  . Degenerative disc disease, lumbar 11/29/2015  . Back pain 11/29/2015  .  Bradycardia, drug induced 11/29/2015  . Dehydration 11/29/2015  . Anemia 11/29/2015  . Hypothyroidism 11/29/2015  . Weakness 11/26/2015  . Nephrolithiasis 05/07/2015  . Constipation 05/07/2015  . AKI (acute kidney injury) ([place]) 05/05/2015     Past Surgical History:  Procedure Laterality Date  . CARDIAC SURGERY     bypass  . EYE SURGERY    . FRACTURE SURGERY       Prior to Admission medications   Medication Sig Start Date End Date Taking? Authorizing Provider  acetaminophen (TYLENOL) 325 MG tablet Take 325-650 mg by mouth every 6 (six) hours as needed for moderate pain or fever.    [provider]  allopurinol (ZYLOPRIM) 100 MG tablet Take 200 mg by mouth daily.     [provider]  amiodarone (PACERONE) 400 MG tablet Take 1 tablet (400 mg total) by mouth daily. 04/23/17   [name], MD  apixaban (ELIQUIS) 2.5 MG TABS tablet Take 2.5 mg by mouth 2 (two) times daily.    [provider]  aspirin EC 81 MG tablet Take 81 mg by mouth daily.    [provider]  Cholecalciferol 1000 units tablet Take 1,000 Units by mouth daily.     [provider]  diltiazem (CARDIZEM CD) 240 MG 24 hr capsule Take 1 capsule (240 mg total) by mouth daily. 04/22/17   [name], MD  furosemide (LASIX) 40 MG tablet Take 40 mg by mouth.    [provider]  levothyroxine (SYNTHROID, LEVOTHROID) 75 MCG tablet  Take 100 mcg by mouth daily before breakfast.     [provider]  metoprolol tartrate (LOPRESSOR) 25 MG tablet Take 1 tablet (25 mg total) by mouth 2 (two) times daily. 04/22/17   [name], MD  Multiple Vitamins-Minerals (MULTIVITAMIN ADULTS 50+) TABS Take 1 tablet by mouth daily.    [provider]  pantoprazole (PROTONIX) 40 MG tablet Take 1 tablet (40 mg total) by mouth 2 (two) times daily before a meal. 05/07/15   Theodoro Grist, MD  tamsulosin (FLOMAX) 0.4 MG CAPS capsule Take 0.4 mg by mouth daily.    [provider]      Allergies Patient has no known allergies.   Family History  Problem Relation Age of Onset  . Osteoporosis Mother   . Heart Problems Father   . Stroke Maternal Grandfather   . Stroke Paternal Grandfather   . Stroke Paternal Grandmother     Social History Social History   Tobacco Use  . Smoking status: Former Research scientist (life sciences)  . Smokeless tobacco: Never Used  Substance Use Topics  . Alcohol use: No    Alcohol/week: 0.0 standard drinks  . Drug use: No    Review of Systems  Constitutional:   No fever or chills.  ENT:   No sore throat. No rhinorrhea. Cardiovascular:   No chest pain or syncope. Respiratory:   No dyspnea or cough. Gastrointestinal:   Negative for abdominal pain, vomiting and diarrhea.  Musculoskeletal:   Chronic lower extremity edema All other systems reviewed and are negative except as documented above in ROS and HPI.  ____________________________________________   PHYSICAL EXAM:  VITAL SIGNS: ED Triage Vitals [12/27/2019 1845]  Enc Vitals Group     BP (!) 94/44     Pulse Rate 80     Resp 20     Temp 98.6 F (37 C)     Temp Source Oral     SpO2 98 %     Weight      Height      Head Circumference      Peak Flow      Pain Score 0     Pain Loc      Pain Edu?      Excl. in [place]?     Vital signs reviewed, nursing assessments reviewed.   Constitutional:   Alert and oriented. Non-toxic appearance. Eyes:   Conjunctivae are normal. EOMI. PERRL. ENT      Head:   Normocephalic and atraumatic.      Nose:   Wearing a mask.      Mouth/Throat:   Wearing a mask.      Neck:   No meningismus. Full ROM.  No JVD Hematological/Lymphatic/Immunilogical:   No cervical lymphadenopathy. Cardiovascular:   RRR. Symmetric bilateral radial and DP pulses.  No murmurs. Cap refill less than 2 seconds. Respiratory:   Normal respiratory effort without tachypnea/retractions. Breath sounds are clear and equal bilaterally. No wheezes/rales/rhonchi. Gastrointestinal:   Soft and  nontender. Non distended. There is no CVA tenderness.  No rebound, rigidity, or guarding.  There is a 4 cm umbilical hernia, soft and easily reducible, nontender.  Musculoskeletal:   Normal range of motion in all extremities. No joint effusions.  No lower extremity tenderness.  2+ pitting edema bilateral lower extremities, symmetric.  No erythema.  No palpable cords. Neurologic:   Normal speech and language.  Motor grossly intact. No acute focal neurologic deficits are appreciated.  Skin:    Skin is warm, dry and intact.  No rash noted.  No petechiae, purpura, or bullae.  ____________________________________________    LABS (pertinent positives/negatives) (all labs ordered are listed, but only abnormal results are displayed) Labs Reviewed  COMPREHENSIVE METABOLIC PANEL - Abnormal; Notable for the following components:      Result Value   Sodium 130 (*)    Chloride 96 (*)    Glucose, Bld 131 (*)    BUN 119 (*)    Creatinine, Ser 4.84 (*)    Calcium 8.1 (*)    Total Protein 5.8 (*)    Albumin 2.8 (*)    AST 128 (*)    ALT 162 (*)    GFR calc non Af Amer 10 (*)    All other components within normal limits  CBC WITH DIFFERENTIAL/PLATELET - Abnormal; Notable for the following components:   RBC 3.88 (*)    Hemoglobin 7.6 (*)    HCT 26.5 (*)    MCV 68.3 (*)    MCH 19.6 (*)    MCHC 28.7 (*)    RDW 20.8 (*)    nRBC 0.5 (*)    Lymphs Abs 0.3 (*)    Abs Immature Granulocytes 0.17 (*)    All other components within normal limits  BRAIN NATRIURETIC PEPTIDE - Abnormal; Notable for the following components:   B Natriuretic Peptide 131.2 (*)    All other components within normal limits  URINALYSIS, COMPLETE (UACMP) WITH MICROSCOPIC - Abnormal; Notable for the following components:   Color, Urine YELLOW (*)    APPearance HAZY (*)    Leukocytes,Ua SMALL (*)    Bacteria, UA FEW (*)    All other components within normal limits  TROPONIN I (HIGH SENSITIVITY) - Abnormal; Notable for the  following components:   Troponin I (High Sensitivity) 19 (*)    All other components within normal limits  URINE CULTURE  RESPIRATORY PANEL BY RT PCR (FLU A&B, COVID)  TROPONIN I (HIGH SENSITIVITY)   ____________________________________________   EKG  Interpreted by me Sinus rhythm rate of 78, normal axis and intervals.  Normal QRS ST segments and T waves.  No ischemic changes.  ____________________________________________    SWNIOEVOJ  DG Chest Portable 1 View  Result Date: 12/07/2019 CLINICAL DATA:  Shortness of breath.  Extremity swelling. EXAM: PORTABLE CHEST 1 VIEW COMPARISON:  Radiograph 09/10/2018 FINDINGS: Post median sternotomy and CABG. Mild cardiomegaly. There is increasing interstitial thickening suspicious for pulmonary edema. No large pleural effusion. No evidence of focal airspace disease. No pneumothorax. Degenerative change of both shoulders. IMPRESSION: Cardiomegaly with increasing interstitial thickening suspicious for pulmonary edema. Electronically Signed   By: Keith Rake M.D.   On: 12/09/2019 21:08    ____________________________________________   PROCEDURES Procedures  ____________________________________________  DIFFERENTIAL DIAGNOSIS   Urinary retention, UTI, AKI, electrolyte abnormality, volume overload  CLINICAL IMPRESSION / ASSESSMENT AND PLAN / ED COURSE  Medications ordered in the ED: Medications  sodium chloride 0.9 % bolus 500 mL (has no administration in time range)    Pertinent labs & imaging results that were available during my care of the patient were reviewed by me and considered in my medical decision making (see chart for details).  Gary Grant was evaluated in Emergency Department on 12/06/2019 for the symptoms described in the history of present illness. He was evaluated in the context of the global COVID-19 pandemic, which necessitated consideration that the patient might be at risk for infection with the SARS-CoV-2  virus that causes COVID-19. Institutional protocols and algorithms that pertain to  the evaluation of patients at risk for COVID-19 are in a state of rapid change based on information released by regulatory bodies including the CDC and federal and state organizations. These policies and algorithms were followed during the patient's care in the ED.   Patient presents with urinary frequency and incontinence in the setting of heart failure on Lasix.  Bladder scan almost 400 mL, will place Foley, check labs.  Clinical Course as of Dec 01 2252  Tue Dec 02, 2019  2123 Chest x-ray reviewed, shows possible increase in vascular congestion, but overall unremarkable.   [PS]  2235 Additional history obtained from daughter at bedside who notes that patient has been confused for the last 2 days and exhibiting some visual hallucinations.Labs show AKI on CKD, with creatinine of 4.8 compared to baseline of about 2.5.  Also has acute uremia with a BUN of 119, explaining recent confusion symptoms of metabolic encephalopathy.   [PS]  2237 We will give a small fluid bolus for hydration   [PS]    Clinical Course User Index [PS] Carrie Mew, MD     ____________________________________________   FINAL CLINICAL IMPRESSION(S) / ED DIAGNOSES    Final diagnoses:  Acute on chronic renal insufficiency  Uremic encephalopathy  Chronic congestive heart failure, unspecified heart failure type [facility])     ED Discharge Orders    None      Portions of this note were generated with dragon dictation software. Dictation errors may occur despite best attempts at proofreading.   Carrie Mew, MD 12/27/2019 2255

## 2019-12-02 NOTE — H&P (Addendum)
History and Physical    Gary Grant:[id number] DOB: February 07, 1924 DOA: 12/19/2019  [facility]   Patient coming from: Home  I have personally briefly reviewed patient's old medical records in [place]  Chief Complaint: Weakness, fall, urinary frequency, lower extremity swelling  HPI: [name] is a 86 y.o. male with medical history significant for CAD status post CABG, paroxysmal A. fib, diastolic heart failure, HTN, hypothyroidism, BPH, who was brought to the emergency room mainly with concern for urinary frequency, generalized weakness and a recent fall.  Patient was seen by his PCP who did blood work and he was told he was anemic and his renal function was worsening.  Most of the history is given by daughter at the bedside who brought patient in because of frequency and concern for UTI.  He has had no recent fever, cough or shortness of breath beyond his baseline.  Has bilateral lower extremity edema but this is mostly at his baseline.  Denies abdominal pain, nausea vomiting or change in bowel habits. ED Course: On arrival, he was hypotensive at 94/44 with otherwise normal vitals.  Blood work significant for creatinine of 4.84 above baseline of 2.75, hemoglobin of 7.6, elevated AST/ALT of 128/162.  WBC normal at 8300.  Troponin 19, BNP 131.  Also had mild hyponatremia of 130.  Urinalysis with few bacteria and small leukocyte esterase EKG as reviewed by me : Sinus rhythm with no acute ST-T wave changes Chest x-ray: Cardiomegaly with increasing interstitial thickening suspicious for pulmonary edema.   Bladder scan in the ER was 400.  Foley catheter placed.  Patient was given a gentle fluid bolus of 500 for suspected dehydration.  Hospitalist consulted for admission.   Review of Systems: As per HPI otherwise all other systems on review of systems negative.  History probably unreliable as patient is only oriented to person and place  Past Medical  History:  Diagnosis Date  . CAD (coronary artery disease)   . CHF (congestive heart failure) ([place])   . Chronic a-fib   . CKD (chronic kidney disease)   . COPD (chronic obstructive pulmonary disease) ([place])   . GERD (gastroesophageal reflux disease)   . HTN (hypertension)   . Hypothyroidism   . MI (myocardial infarction) ([place])   . PAD (peripheral artery disease) ([place])   . Prostate cancer ([place])   . SSS (sick sinus syndrome) ([place])   . Venous insufficiency     Past Surgical History:  Procedure Laterality Date  . CARDIAC SURGERY     bypass  . EYE SURGERY    . FRACTURE SURGERY       reports that he has quit smoking. He has never used smokeless tobacco. He reports that he does not drink alcohol and does not use drugs.  No Known Allergies  Family History  Problem Relation Age of Onset  . Osteoporosis Mother   . Heart Problems Father   . Stroke Maternal Grandfather   . Stroke Paternal Grandfather   . Stroke Paternal Grandmother       Prior to Admission medications   Medication Sig Start Date End Date Taking? Authorizing Provider  acetaminophen (TYLENOL) 325 MG tablet Take 325-650 mg by mouth every 6 (six) hours as needed for moderate pain or fever.    [provider]  allopurinol (ZYLOPRIM) 100 MG tablet Take 200 mg by mouth daily.     [provider]  amiodarone (PACERONE) 400 MG tablet Take 1 tablet (400  mg total) by mouth daily. 04/23/17   [name], MD  apixaban (ELIQUIS) 2.5 MG TABS tablet Take 2.5 mg by mouth 2 (two) times daily.    [provider]  aspirin EC 81 MG tablet Take 81 mg by mouth daily.    [provider]  Cholecalciferol 1000 units tablet Take 1,000 Units by mouth daily.     [provider]  diltiazem (CARDIZEM CD) 240 MG 24 hr capsule Take 1 capsule (240 mg total) by mouth daily. 04/22/17   [name], MD  furosemide (LASIX) 40 MG tablet Take 40 mg by mouth.    [provider]  levothyroxine (SYNTHROID,  LEVOTHROID) 75 MCG tablet Take 100 mcg by mouth daily before breakfast.     [provider]  metoprolol tartrate (LOPRESSOR) 25 MG tablet Take 1 tablet (25 mg total) by mouth 2 (two) times daily. 04/22/17   [name], MD  Multiple Vitamins-Minerals (MULTIVITAMIN ADULTS 50+) TABS Take 1 tablet by mouth daily.    [provider]  pantoprazole (PROTONIX) 40 MG tablet Take 1 tablet (40 mg total) by mouth 2 (two) times daily before a meal. 05/07/15   Theodoro Grist, MD  tamsulosin (FLOMAX) 0.4 MG CAPS capsule Take 0.4 mg by mouth daily.    [provider]    Physical Exam: Vitals:   12/01/2019 1845 12/18/2019 1940 12/01/2019 2100  BP: (!) 94/44 116/63 128/67  Pulse: 80 74 72  Resp: 20 (!) 22 (!) 24  Temp: 98.6 F (37 C)    TempSrc: Oral    SpO2: 98% 99% 98%     Vitals:   12/22/2019 1845 12/19/2019 1940 12/05/2019 2100  BP: (!) 94/44 116/63 128/67  Pulse: 80 74 72  Resp: 20 (!) 22 (!) 24  Temp: 98.6 F (37 C)    TempSrc: Oral    SpO2: 98% 99% 98%      Constitutional:  Somewhat ill-appearing.  Oriented to person and place only. Not in any apparent distress HEENT:      Head: Normocephalic and atraumatic.         Eyes: PERLA, EOMI, Conjunctivae are normal. Sclera is non-icteric.       Mouth/Throat: Mucous membranes are moist.       Neck: Supple with no signs of meningismus. Cardiovascular: Regular rate and rhythm. No murmurs, gallops, or rubs. 2+ symmetrical distal pulses are present . No JVD. No 2+ rightLE edema Respiratory: Respiratory effort normal .Lungs sounds clear bilaterally. No wheezes, crackles, or rhonchi.  Gastrointestinal: Soft, non tender, and non distended with positive bowel sounds. No rebound or guarding. Genitourinary: No CVA tenderness.  Foley catheter Musculoskeletal: Nontender with normal range of motion in all extremities. No cyanosis, or erythema of extremities. Neurologic:  Face is symmetric. Moving all extremities. No gross focal neurologic  deficits . Skin: Skin is warm, dry.  No rash or ulcers Psychiatric: Mood and affect are normal    Labs on Admission: I have personally reviewed following labs and imaging studies  CBC: Recent Labs  Lab 12/18/2019 2107  WBC 8.3  NEUTROABS 6.8  HGB 7.6*  HCT 26.5*  MCV 68.3*  PLT 308   Basic Metabolic Panel: Recent Labs  Lab 12/01/2019 2107  NA 130*  K 4.4  CL 96*  CO2 24  GLUCOSE 131*  BUN 119*  CREATININE 4.84*  CALCIUM 8.1*   GFR: CrCl cannot be calculated (Unknown ideal weight.). Liver Function Tests: Recent Labs  Lab 12/25/2019 2107  AST 128*  ALT  162*  ALKPHOS 108  BILITOT 0.8  PROT 5.8*  ALBUMIN 2.8*   No results for input(s): LIPASE, AMYLASE in the last 168 hours. No results for input(s): AMMONIA in the last 168 hours. Coagulation Profile: No results for input(s): INR, PROTIME in the last 168 hours. Cardiac Enzymes: No results for input(s): CKTOTAL, CKMB, CKMBINDEX, TROPONINI in the last 168 hours. BNP (last 3 results) No results for input(s): PROBNP in the last 8760 hours. HbA1C: No results for input(s): HGBA1C in the last 72 hours. CBG: No results for input(s): GLUCAP in the last 168 hours. Lipid Profile: No results for input(s): CHOL, HDL, LDLCALC, TRIG, CHOLHDL, LDLDIRECT in the last 72 hours. Thyroid Function Tests: No results for input(s): TSH, T4TOTAL, FREET4, T3FREE, THYROIDAB in the last 72 hours. Anemia Panel: No results for input(s): VITAMINB12, FOLATE, FERRITIN, TIBC, IRON, RETICCTPCT in the last 72 hours. Urine analysis:    Component Value Date/Time   COLORURINE YELLOW (A) 12/27/2019 2107   APPEARANCEUR HAZY (A) 12/15/2019 2107   APPEARANCEUR Clear 05/30/2013 2025   LABSPEC 1.010 12/20/2019 2107   LABSPEC 1.010 05/30/2013 2025   PHURINE 5.0 12/20/2019 2107   GLUCOSEU NEGATIVE 12/09/2019 2107   GLUCOSEU Negative 05/30/2013 2025   HGBUR NEGATIVE 12/07/2019 2107   BILIRUBINUR NEGATIVE 12/20/2019 2107   BILIRUBINUR Negative  05/30/2013 2025   [place] 12/22/2019 2107   PROTEINUR NEGATIVE 12/01/2019 2107   NITRITE NEGATIVE 12/23/2019 2107   LEUKOCYTESUR SMALL (A) 12/03/2019 2107   LEUKOCYTESUR Negative 05/30/2013 2025    Radiological Exams on Admission: DG Chest Portable 1 View  Result Date: 11/30/2019 CLINICAL DATA:  Shortness of breath.  Extremity swelling. EXAM: PORTABLE CHEST 1 VIEW COMPARISON:  Radiograph 09/10/2018 FINDINGS: Post median sternotomy and CABG. Mild cardiomegaly. There is increasing interstitial thickening suspicious for pulmonary edema. No large pleural effusion. No evidence of focal airspace disease. No pneumothorax. Degenerative change of both shoulders. IMPRESSION: Cardiomegaly with increasing interstitial thickening suspicious for pulmonary edema. Electronically Signed   By: Keith Rake M.D.   On: 12/06/2019 21:08     Assessment/Plan 86 year old male with history of CAD status post CABG, paroxysmal A. fib, diastolic heart failure, HTN, hypothyroidism, BPH, who was brought to the emergency room mainly with concern for urinary frequency, generalized weakness and a recent fall.    AKI (acute kidney injury) ([place])   CKD (chronic kidney disease), stage IV (HCC) -Creatinine 4.84 up from baseline of 2.75 -Suspect related in part to diuretic therapy -Gentle IV hydration and monitor kidney function -We will get CT abdomen and pelvis to evaluate for obstructive process in view of acute urinary retention  Acute metabolic encephalopathy Fall -Patient presented with mild confusion, improved with IV hydration in the ER -Likely related to dehydration in combination with worsening renal function -Treat acute etiology -Fall precautions, neurologic checks, aspiration precautions -No reported injury from recent fall    Acute urinary retention -Bladder scan with 400 mils of urine -Urinalysis with just few bacteria and small leukocyte esterase -Follow urine culture    Hypotension  with history hypertension -BP 94/44 on arrival but was responsive to IV fluids -Suspect related to home meds including diuretic therapy -Patient not meeting sepsis criteria -Continue cautious IV hydration -Hold home antihypertensives    Chronic anticoagulation   AF (paroxysmal atrial fibrillation) ([place]) -Patient in normal sinus rhythm -Continue home apixaban and amiodarone    Hypothyroidism -Continue levothyroxine    Chronic diastolic heart failure ([place]) -Patient appears euvolemic to dry in spite of mild interstitial  edema on chest x-ray and mildly elevated BNP of 131 -Monitor for fluid overload in view of IV hydration and treatment of suspected dehydration and hypotension    Anemia of chronic kidney failure, stage 4 (severe) (HCC) -Hemoglobin 7.6.  Continue to monitor -Patient typed and screened    CAD (coronary artery disease) -Troponins are negative, EKG nonacute -Continue home meds    Elevated LFTs -LFTs slightly elevated, possibly related to hepatic congestion from CHF ago acute exacerbation of CHF not suspected at this time -Continue to monitor    DVT prophylaxis: Lovenox  Code Status: full code  Family Communication: Daughter at bedside  disposition Plan: Back to previous home environment Consults called: Renal Status:At the time of admission, it appears that the appropriate admission status for this patient is INPATIENT. This is judged to be reasonable and necessary in order to provide the required intensity of service to ensure the patient's safety given the presenting symptoms, physical exam findings, and initial radiographic and laboratory data in the context of their  Comorbid conditions.   Patient requires inpatient status due to high intensity of service, high risk for further deterioration and high frequency of surveillance required.   I certify that at the point of admission it is my clinical judgment that the patient will require inpatient hospital care  spanning beyond 2 midnights That was ordered    Athena Masse MD [facility]     12/03/2019, 11:15 PM

## 2019-12-02 NOTE — ED Notes (Signed)
H&H noted. Dr Damita Dunnings notified and received verbal order for type and screen

## 2019-12-03 ENCOUNTER — Encounter: Payer: Self-pay | Admitting: Internal Medicine

## 2019-12-03 ENCOUNTER — Inpatient Hospital Stay: Payer: Medicare HMO

## 2019-12-03 DIAGNOSIS — N189 Chronic kidney disease, unspecified: Secondary | ICD-10-CM

## 2019-12-03 DIAGNOSIS — N289 Disorder of kidney and ureter, unspecified: Secondary | ICD-10-CM

## 2019-12-03 DIAGNOSIS — G9341 Metabolic encephalopathy: Secondary | ICD-10-CM | POA: Diagnosis not present

## 2019-12-03 DIAGNOSIS — N179 Acute kidney failure, unspecified: Secondary | ICD-10-CM | POA: Diagnosis not present

## 2019-12-03 DIAGNOSIS — R338 Other retention of urine: Secondary | ICD-10-CM | POA: Diagnosis not present

## 2019-12-03 DIAGNOSIS — I480 Paroxysmal atrial fibrillation: Secondary | ICD-10-CM

## 2019-12-03 DIAGNOSIS — Z7189 Other specified counseling: Secondary | ICD-10-CM

## 2019-12-03 DIAGNOSIS — Z515 Encounter for palliative care: Secondary | ICD-10-CM

## 2019-12-03 LAB — CBC
HCT: 22.3 % — ABNORMAL LOW (ref 39.0–52.0)
Hemoglobin: 6.8 g/dL — ABNORMAL LOW (ref 13.0–17.0)
MCH: 20.2 pg — ABNORMAL LOW (ref 26.0–34.0)
MCHC: 30.5 g/dL (ref 30.0–36.0)
MCV: 66.2 fL — ABNORMAL LOW (ref 80.0–100.0)
Platelets: 284 10*3/uL (ref 150–400)
RBC: 3.37 MIL/uL — ABNORMAL LOW (ref 4.22–5.81)
RDW: 20.8 % — ABNORMAL HIGH (ref 11.5–15.5)
WBC: 8.4 10*3/uL (ref 4.0–10.5)
nRBC: 0.4 % — ABNORMAL HIGH (ref 0.0–0.2)

## 2019-12-03 LAB — BASIC METABOLIC PANEL
Anion gap: 8 (ref 5–15)
BUN: 109 mg/dL — ABNORMAL HIGH (ref 8–23)
CO2: 24 mmol/L (ref 22–32)
Calcium: 7.8 mg/dL — ABNORMAL LOW (ref 8.9–10.3)
Chloride: 98 mmol/L (ref 98–111)
Creatinine, Ser: 4.64 mg/dL — ABNORMAL HIGH (ref 0.61–1.24)
GFR calc non Af Amer: 10 mL/min — ABNORMAL LOW (ref >60)
Glucose, Bld: 95 mg/dL (ref 70–99)
Potassium: 4.2 mmol/L (ref 3.5–5.1)
Sodium: 130 mmol/L — ABNORMAL LOW (ref 135–145)

## 2019-12-03 LAB — ABO/RH: ABO/RH(D): A POS

## 2019-12-03 LAB — RESPIRATORY PANEL BY RT PCR (FLU A&B, COVID)
Influenza A by PCR: NEGATIVE
Influenza B by PCR: NEGATIVE
SARS Coronavirus 2 by RT PCR: NEGATIVE

## 2019-12-03 LAB — TROPONIN I (HIGH SENSITIVITY): Troponin I (High Sensitivity): 17 ng/L (ref <18)

## 2019-12-03 LAB — PREPARE RBC (CROSSMATCH)

## 2019-12-03 MED ORDER — SODIUM CHLORIDE 0.9% IV SOLUTION
Freq: Once | INTRAVENOUS | Status: AC
  Filled 2019-12-03: qty 250

## 2019-12-03 NOTE — Progress Notes (Signed)
OT Cancellation Note  Patient Details Name: Gary Grant MRN: [id number] DOB: May 02, 1923   Cancelled Treatment:    Reason Eval/Treat Not Completed: Other (comment). OT order received and chart reviewed. Pt currently participating in PT evaluation. OT will follow up when pt is available.   Darleen Crocker, [place], OTR/L , CBIS ascom [phone]  12/03/19, 3:58 PM   12/03/2019, 3:58 PM

## 2019-12-03 NOTE — Progress Notes (Addendum)
PROGRESS NOTE    Gary Grant  PNT:[id number] DOB: 06/30/1923 DOA: 12/20/2019 PCP: [facility]   Chief Complaint  Patient presents with  . Leg Swelling  . Weakness    Brief Narrative:  86 year old gentleman with prior history of coronary artery disease s/p CABG, paroxysmal atrial fibrillation, chronic diastolic heart failure, hypertension, hypothyroidism, BPH brought to ED for evaluation of generalized weakness, multiple falls and urinary retention.  On arrival to ED he was found to have urinary retention and bladder scan revealed 400 mL urine Foley catheter was placed and he was found to be in AKI.   Assessment & Plan:   Principal Problem:   AKI (acute kidney injury) ([place]) Active Problems:   Falls   Hypothyroidism   Chronic diastolic heart failure (HCC)   HTN (hypertension)   CKD (chronic kidney disease), stage IV (HCC)   Acute urinary retention   Hypotension   Anemia of chronic kidney failure, stage 4 (severe) (HCC)   CAD (coronary artery disease)   Chronic anticoagulation   Acute metabolic encephalopathy   AF (paroxysmal atrial fibrillation) (HCC)   Elevated LFTs   Acute kidney injury superimposed on stage IV CKD.  Patient's baseline creatinine at 2.75 was admitted with a creatinine of 4.8 Gently hydrated and repeat renal parameters have improved to 4.6. CT of the abdomen and pelvis does not show any hydronephrosis. Foley catheter was placed.    Acute metabolic encephalopathy Appears to have resolved, probably secondary to AKI from dehydration.     Generalized weakness, multiple falls probably secondary to deconditioning and dehydration. PT evaluation ordered for further evaluation.    Acute urinary retention Unclear etiology, no hydronephrosis on the CT, Foley catheter placed.  Urine analysis shows few bacteria and small leukocyte esterase. Cultures, patient currently denies any dysuria.   Mild hypotension on admission which has  resolved with IV fluids. Hold diuretics at this time.   Atrial fibrillation, paroxysmal Currently patient is in normal sinus rhythm, rate controlled with amiodarone.  Continue with Eliquis for anticoagulation.   Hypothyroidism:  Resume levothyroxine.    Anemia of acute illness superimposed on chronic disease Patient's hemoglobin on admission was around 7.6 and after fluid bolus dropped to 6.8 probably from hemodilution.   Recommend to transfuse 1 unit of PRBC and recheck hemoglobin posttransfusion.  We will also order 20 mg of IV Lasix posttransfusion. Will get stool for occult blood and anemia panel for further evaluation.  Hyponatremia:  BNP IS 131.  ? From diuresis. Recommend holding lasix, recheck sodium in am.    Elevated liver enzymes:  Unclear etiology.  CT of the abdomen pelvis did not reveal any abnormalities.  We will get ultrasound liver for further evaluation.    In view of multiple falls, multiple comorbidities, anemia stage V CKD palliative care consulted for goals of care.    DVT prophylaxis: eliquis.  code Status: DNR Family Communication: None at bedside Disposition:   Status is: Inpatient  Remains inpatient appropriate because:Ongoing diagnostic testing needed not appropriate for outpatient work up, Unsafe d/c plan, IV treatments appropriate due to intensity of illness or inability to take PO and Inpatient level of care appropriate due to severity of illness   Dispo: The patient is from: Home              Anticipated d/c is to: Pending              Anticipated d/c date is: 2 days  Patient currently is not medically stable to d/c.       Consultants:   Palliative consult  Procedures: None Antimicrobials: None  Subjective: Patient denies any new complaints right now.  He reports he had multiple falls at home without any loss of consciousness.  He reports his legs gave away.  Objective: Vitals:   12/03/19 1046 12/03/19 1100  12/03/19 1130 12/03/19 1200  BP: (!) 122/54 (!) 126/57 (!) 125/54 (!) 107/55  Pulse: 79 77 76 74  Resp: (!) 24 (!) 26 (!) 21 19  Temp: 98 F (36.7 C) 98.1 F (36.7 C) 97.9 F (36.6 C) 98.4 F (36.9 C)  TempSrc: Oral Oral Oral Oral  SpO2: 92% 95% 94% 94%    Intake/Output Summary (Last 24 hours) at 12/03/2019 1235 Last data filed at 12/03/2019 1020 Gross per 24 hour  Intake 1341.51 ml  Output --  Net 1341.51 ml   There were no vitals filed for this visit.  Examination:  General exam: Appears calm and comfortable  Respiratory system: Clear to auscultation. Respiratory effort normal. Cardiovascular system: S1 & S2 heard, RRR. No JVD,  Gastrointestinal system: Abdomen is nondistended, soft and nontender.Normal bowel sounds heard. Central nervous system: Alert and oriented. No focal neurological deficits. Extremities: Bilateral lower extremity edema Skin: stage 2 pressure injury on the sacrum.  Psychiatry: Mood & affect appropriate.     Data Reviewed: I have personally reviewed following labs and imaging studies  CBC: Recent Labs  Lab 12/20/2019 2107 12/03/19 0615  WBC 8.3 8.4  NEUTROABS 6.8  --   HGB 7.6* 6.8*  HCT 26.5* 22.3*  MCV 68.3* 66.2*  PLT 296 998    Basic Metabolic Panel: Recent Labs  Lab 12/19/2019 2107 12/03/19 0615  NA 130* 130*  K 4.4 4.2  CL 96* 98  CO2 24 24  GLUCOSE 131* 95  BUN 119* 109*  CREATININE 4.84* 4.64*  CALCIUM 8.1* 7.8*    GFR: CrCl cannot be calculated (Unknown ideal weight.).  Liver Function Tests: Recent Labs  Lab 12/23/2019 2107  AST 128*  ALT 162*  ALKPHOS 108  BILITOT 0.8  PROT 5.8*  ALBUMIN 2.8*    CBG: No results for input(s): GLUCAP in the last 168 hours.   Recent Results (from the past 240 hour(s))  Respiratory Panel by RT PCR (Flu A&B, Covid) - Nasopharyngeal Swab     Status: None   Collection Time: 11/28/2019 10:36 PM   Specimen: Nasopharyngeal Swab  Result Value Ref Range Status   SARS Coronavirus 2 by  RT PCR NEGATIVE NEGATIVE Final    Comment: (NOTE) SARS-CoV-2 target nucleic acids are NOT DETECTED.  The SARS-CoV-2 RNA is generally detectable in upper respiratoy specimens during the acute phase of infection. The lowest concentration of SARS-CoV-2 viral copies this assay can detect is 131 copies/mL. A negative result does not preclude SARS-Cov-2 infection and should not be used as the sole basis for treatment or other patient management decisions. A negative result may occur with  improper specimen collection/handling, submission of specimen other than nasopharyngeal swab, presence of viral mutation(s) within the areas targeted by this assay, and inadequate number of viral copies (<131 copies/mL). A negative result must be combined with clinical observations, patient history, and epidemiological information. The expected result is Negative.  Fact Sheet for Patients:  PinkCheek.be  Fact Sheet for Healthcare Providers:  GravelBags.it  This test is no t yet approved or cleared by the [place] FDA and  has been authorized for detection and/or  diagnosis of SARS-CoV-2 by FDA under an Emergency Use Authorization (EUA). This EUA will remain  in effect (meaning this test can be used) for the duration of the COVID-19 declaration under Section 564(b)(1) of the Act, 21 U.S.C. section 360bbb-3(b)(1), unless the authorization is terminated or revoked sooner.     Influenza A by PCR NEGATIVE NEGATIVE Final   Influenza B by PCR NEGATIVE NEGATIVE Final    Comment: (NOTE) The Xpert Xpress SARS-CoV-2/FLU/RSV assay is intended as an aid in  the diagnosis of influenza from Nasopharyngeal swab specimens and  should not be used as a sole basis for treatment. Nasal washings and  aspirates are unacceptable for Xpert Xpress SARS-CoV-2/FLU/RSV  testing.  Fact Sheet for Patients: PinkCheek.be  Fact Sheet for  Healthcare Providers: GravelBags.it  This test is not yet approved or cleared by the [place] FDA and  has been authorized for detection and/or diagnosis of SARS-CoV-2 by  FDA under an Emergency Use Authorization (EUA). This EUA will remain  in effect (meaning this test can be used) for the duration of the  Covid-19 declaration under Section 564(b)(1) of the Act, 21  U.S.C. section 360bbb-3(b)(1), unless the authorization is  terminated or revoked. Performed at [facility], [address] [place], [place] [address]          Radiology Studies: CT ABDOMEN PELVIS WO CONTRAST  Result Date: 12/03/2019 CLINICAL DATA:  Urinary retention. EXAM: CT ABDOMEN AND PELVIS WITHOUT CONTRAST TECHNIQUE: Multidetector CT imaging of the abdomen and pelvis was performed following the standard protocol without IV contrast. COMPARISON:  May 02, 2015 FINDINGS: Lower chest: There is chronic scarring versus atelectasis at the lung bases bilaterally.The heart is enlarged. The intracardiac blood pool is hypodense relative to the adjacent myocardium consistent with anemia. Coronary artery calcifications are noted. Hepatobiliary: The liver is normal. Normal gallbladder.There is no biliary ductal dilation. Pancreas: Normal contours without ductal dilatation. No peripancreatic fluid collection. Spleen: Unremarkable. Adrenals/Urinary Tract: --Adrenal glands: Unremarkable. --Right kidney/ureter: There is a punctate nonobstructing stone in the upper pole the right kidney. --Left kidney/ureter: Multiple punctate nonobstructing stones are noted. --Urinary bladder: Bladder is decompressed by Foley catheter and therefore is not well evaluated. Stomach/Bowel: --Stomach/Duodenum: No hiatal hernia or other gastric abnormality. Normal duodenal course and caliber. --Small bowel: Unremarkable. --Colon: Unremarkable. --Appendix: Normal. Vascular/Lymphatic: Atherosclerotic calcification is  present within the non-aneurysmal abdominal aorta, without hemodynamically significant stenosis. --No retroperitoneal lymphadenopathy. --No mesenteric lymphadenopathy. --No pelvic or inguinal lymphadenopathy. Reproductive: Unremarkable Other: No ascites or free air. There are bilateral fat containing inguinal hernias, left greater than right. There is a fat containing periumbilical hernia. Musculoskeletal. No acute displaced fractures. IMPRESSION: 1. No acute abdominopelvic abnormality. 2. Bilateral fat containing inguinal hernias, left greater than right. 3. Fat containing periumbilical hernia. 4. Cardiomegaly and coronary artery disease. 5. Anemia. Aortic Atherosclerosis (ICD10-I70.0). Electronically Signed   By: Constance Holster M.D.   On: 12/03/2019 02:49   DG Chest Portable 1 View  Result Date: 12/15/2019 CLINICAL DATA:  Shortness of breath.  Extremity swelling. EXAM: PORTABLE CHEST 1 VIEW COMPARISON:  Radiograph 09/10/2018 FINDINGS: Post median sternotomy and CABG. Mild cardiomegaly. There is increasing interstitial thickening suspicious for pulmonary edema. No large pleural effusion. No evidence of focal airspace disease. No pneumothorax. Degenerative change of both shoulders. IMPRESSION: Cardiomegaly with increasing interstitial thickening suspicious for pulmonary edema. Electronically Signed   By: Keith Rake M.D.   On: 12/10/2019 21:08        Scheduled Meds: . sodium chloride  Intravenous Once   Continuous Infusions:   LOS: 1 day        Hosie Poisson, MD [facility]   To contact the attending provider between 7A-7P or the covering provider during after hours 7P-7A, please log into the web site [email or web address] and access using universal [place] password for that web site. If you do not have the password, please call the hospital operator.  12/03/2019, 12:35 PM

## 2019-12-03 NOTE — Consult Note (Addendum)
Consultation Note Date: 12/03/2019   Patient Name: Gary Grant  DOB: 08-Feb-1924  MRN: [id number]  Age / Sex: 86 y.o., male  PCP: [facility] [place] Referring Physician: Hosie Poisson, MD  Reason for Consultation: Establishing goals of care  HPI/Patient Profile: Gary Grant is a 86 y.o. male with medical history significant for CAD status post CABG, paroxysmal A. fib, diastolic heart failure, HTN, hypothyroidism, BPH, who was brought to the emergency room mainly with concern for urinary frequency, generalized weakness and a recent fall.  Clinical Assessment and Goals of Care: Patient is resting in bed in the ED.  He is able to tell me his name, date of birth, that he is at the hospital, and the reason he is here because of falling, weakness, and urinary frequency.  He is unable to tell me the year or the president.  He states that he lives with his daughter.   Functionally, he tells me that he is mostly bed and chair bound due to leg weakness.  He states on a routine day he will " sit and watch TV. I don't do anything".  He discusses a bike that he states he rides to go where he needs to go, and then explains that he used to ride it, but that it is "tired and rotted down".  He states that he has not driven in years.  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   We discussed his diagnoses, prognosis, GOC, EOL wishes disposition and options.  A detailed discussion was had today regarding advanced directives.  Concepts specific to code status, artifical feeding and hydration, IV antibiotics and rehospitalization were discussed.  The difference between an aggressive medical intervention path and a comfort care path was discussed.  Values and goals of care important to patient and family were attempted to be elicited.  Discussed  limitations of medical interventions to prolong quality of life in some situations and discussed the concept of human mortality.  Gary Grant states that he wants to try to live as long as he can, and would love to live to be 86 years old, if he can continue to live as he has been living.  He states he wants to see his children continue to grow up and have children of their own.  He states he does not like change, and he wants to be at home watching TV.  As far as medical interventions he states " if I'm going, let me go".  He tells me that he would not want CPR, he would not want to be put on a breathing machine, would not want attempts to restart his heart or breathing (CPR) if his heart and breathing stops, and would not want dialysis.  He states "my time is running out".  He tells me that he does want to try to continue to treat the treatable at this time including blood transfusion.  He states  his daughter helps him with decision-making.  Called to speak to his daughter Gary Grant.  She states that he does not keep up with the year or any politics anymore. Discussed the information above with her. She states that she has a brother, but she has lived with her father for the past 2 years and helps with decision making, ADL's and day-to-day life.  Gary Grant tells me that Gary Grant was married over 75 years before he was widowed in 2013.   Functionally, she states he moves from the recliner to the [facility] to the bed.  A year ago he was able to stand long enough and pull up his depends but since then, and particularly in the last 6 to 8 months he has been too weak to stand long enough to pull his depends up.  She becomes tearful and states that she is coming to a point of no longer being able to manage his care needs at home.  She states she does not want to be selfish and that she wants her father to be happy and comfortable.  She states there would be no benefit to CPR if his heart or breathing  stopped, she would not want him placed on a ventilator, and she would not want him to have dialysis.  She would like to treat the treatable and medically optimize him.  Plans to remeet tomorrow to discuss status and care moving forward, hospice versus continued treatment. She states although it is hard for her, she would like him to come home at D/C.     I completed a MOST form today through [employer].  Patient has outlined the wishes below, and daughter has agreed with the wishes. A photocopy was placed in the chart to be scanned into EMR. The patient outlined their wishes for the following treatment decisions:  Cardiopulmonary Resuscitation: Do Not Attempt Resuscitation (DNR/No CPR)  Medical Interventions: Limited Additional Interventions: Use medical treatment, IV fluids and cardiac monitoring as indicated, DO NOT USE intubation or mechanical ventilation. May consider use of less invasive airway support such as BiPAP or CPAP. Also provide comfort measures. Transfer to the hospital if indicated. Avoid intensive care.   Antibiotics: Antibiotics if indicated  IV Fluids: IV fluids if indicated  Feeding Tube: Left Blank     SUMMARY OF RECOMMENDATIONS   DNR/DNI. No dialysis. Treat the treatable at this time, will discuss care moving forward in meeting at 1:00 tomorrow.  Patient needs HPOA papers as he prefers we speak with his daughter, who is the only person listed in his contact information.   Prognosis:  Poor overall.       Primary Diagnoses: Present on Admission: . Hypothyroidism . CAD (coronary artery disease) . Falls . HTN (hypertension) . Chronic diastolic heart failure ([place])   I have reviewed the medical record, interviewed the patient and family, and examined the patient. The following aspects are pertinent.  Past Medical History:  Diagnosis Date  . CAD (coronary artery disease)   . CHF (congestive heart failure) ([place])   . Chronic a-fib   . CKD (chronic kidney disease)   .  COPD (chronic obstructive pulmonary disease) ([place])   . GERD (gastroesophageal reflux disease)   . HTN (hypertension)   . Hypothyroidism   . MI (myocardial infarction) ([place])   . PAD (peripheral artery disease) ([place])   . Prostate cancer ([place])   . SSS (sick sinus syndrome) ([place])   . Venous insufficiency    Social History   Socioeconomic  History  . Marital status: Widowed    Spouse name: Not on file  . Number of children: Not on file  . Years of education: Not on file  . Highest education level: Not on file  Occupational History  . Not on file  Tobacco Use  . Smoking status: Former Research scientist (life sciences)  . Smokeless tobacco: Never Used  Substance and Sexual Activity  . Alcohol use: No    Alcohol/week: 0.0 standard drinks  . Drug use: No  . Sexual activity: Not on file  Other Topics Concern  . Not on file  Social History Narrative   Lives at home independently, uses a cane to ambulate. Also has a walker.   Social Determinants of Health   Financial Resource Strain:   . Difficulty of Paying Living Expenses: Not on file  Food Insecurity:   . Worried About Charity fundraiser in the Last Year: Not on file  . Ran Out of Food in the Last Year: Not on file  Transportation Needs:   . Lack of Transportation (Medical): Not on file  . Lack of Transportation (Non-Medical): Not on file  Physical Activity:   . Days of Exercise per Week: Not on file  . Minutes of Exercise per Session: Not on file  Stress:   . Feeling of Stress : Not on file  Social Connections:   . Frequency of Communication with Friends and Family: Not on file  . Frequency of Social Gatherings with Friends and Family: Not on file  . Attends Religious Services: Not on file  . Active Member of Clubs or Organizations: Not on file  . Attends Archivist Meetings: Not on file  . Marital Status: Not on file   Family History  Problem Relation Age of Onset  . Osteoporosis Mother   . Heart Problems Father   . Stroke  Maternal Grandfather   . Stroke Paternal Grandfather   . Stroke Paternal Grandmother    Scheduled Meds: . sodium chloride   Intravenous Once   Continuous Infusions: PRN Meds:.acetaminophen **OR** acetaminophen, ondansetron **OR** ondansetron (ZOFRAN) IV Medications Prior to Admission:  Prior to Admission medications   Medication Sig Start Date End Date Taking? Authorizing Provider  acetaminophen (TYLENOL) 325 MG tablet Take 325-650 mg by mouth every 6 (six) hours as needed for moderate pain or fever.   Yes [provider]  amiodarone (PACERONE) 200 MG tablet Take 200 mg by mouth daily. 09/17/19  Yes [provider]  apixaban (ELIQUIS) 2.5 MG TABS tablet Take 2.5 mg by mouth 2 (two) times daily.   Yes [provider]  atorvastatin (LIPITOR) 20 MG tablet Take 20 mg by mouth daily. 10/13/19  Yes [provider]  Cholecalciferol 1000 units tablet Take 1,000 Units by mouth daily.    Yes [provider]  DILT-XR 240 MG 24 hr capsule Take 240 mg by mouth daily. 11/04/19  Yes [provider]  Ferrous Sulfate (IRON) 325 (65 Fe) MG TABS Take 1 tablet by mouth every other day. 11/26/19  Yes [provider]  furosemide (LASIX) 40 MG tablet Take 40 mg by mouth daily.    Yes [provider]  levothyroxine (SYNTHROID) 100 MCG tablet Take 100 mcg by mouth daily. 11/26/19  Yes [provider]  Multiple Vitamins-Minerals (MULTIVITAMIN ADULTS 50+) TABS Take 1 tablet by mouth daily.   Yes [provider]  tamsulosin (FLOMAX) 0.4 MG CAPS capsule Take 0.4 mg by mouth daily.   Yes [provider]  No Known Allergies Review of Systems  Constitutional: Positive for fatigue.  Neurological: Positive for weakness.    Physical Exam Pulmonary:     Effort: Pulmonary effort is normal.  Skin:    General: Skin is warm and dry.  Neurological:     Mental Status: He is alert.     Vital Signs: BP (!) 126/57   Pulse 77    Temp 98.1 F (36.7 C) (Oral)   Resp (!) 26   SpO2 95%  Pain Scale: 0-10   Pain Score: 0-No pain   SpO2: SpO2: 95 % O2 Device:SpO2: 95 % O2 Flow Rate: .   IO: Intake/output summary:   Intake/Output Summary (Last 24 hours) at 12/03/2019 1151 Last data filed at 12/03/2019 1020 Gross per 24 hour  Intake 1341.51 ml  Output --  Net 1341.51 ml    LBM:   Baseline Weight:   Most recent weight:       Palliative Assessment/Data:     Time In: 10:35 Time Out: 11:45 Time Total: 70 min Greater than 50%  of this time was spent counseling and coordinating care related to the above assessment and plan.  Signed by: Asencion Gowda, NP   Please contact Palliative Medicine Team phone at [phone] for questions and concerns.  For individual provider: See Shea Evans

## 2019-12-03 NOTE — Evaluation (Signed)
Physical Therapy Evaluation Patient Details Name: Gary CRICKENBERGER MRN: [id number] DOB: 22-Jan-1924 Today's Date: 12/03/2019   History of Present Illness  Gary Grant is a 53yoM (PMH: CAD s/p CABG, PAF, dCHF, hypoTSH, BPH) comes to [facility] ED on 10/5 due to acute onset weakness, multiple falls, urinary retention. Pt noted to have anemia c post hydration Hb drop into 6s, received blood in ED. Per paliative note, baseline includes very minimal mobility at home due to chronic leg weakness. Per chart review, pt stopped driving in 7169, DTR helps with transportation. Pt has a history of chronic LE buckling, began using a RW for gait in 2019, but now reports to perform most mobility c power WC.  Clinical Impression  Pt admitted with above diagnosis. Pt currently with functional limitations due to the deficits listed below (see "PT Problem List"). Upon entry, pt in bed, awake and agreeable to participate. Blood infusion completed >1hr prior per RN.  The pt is alert and oriented x4, pleasant, conversational, and somewhat limited as a historian. Pt requires modA to comes to EOB, able to balance at Supervision level for several minutes. Pt agreeable to attempt STS transfer, but unable to fully rise due to legs giving out despite modA. After transfer attempt, pt has increase dyspnea, which does not improve much by end of session, even after modA return to recumbent sitting. Functional mobility assessment demonstrates increased effort/time requirements, poor tolerance, and absolute need for physical assistance, whereas the patient performed these at a higher level of independence PTA. Pt has no hypoxia this session, although sustained anemia in combination with rate control therapies are likely contributing to DOE. Pt will benefit from skilled PT intervention to increase independence and safety with basic mobility in preparation for discharge to the venue listed below.       Follow Up Recommendations SNF;Supervision  for mobility/OOB    Equipment Recommendations  None recommended by PT    Recommendations for Other Services       Precautions / Restrictions Precautions Precautions: Fall Precaution Comments: Low HB;      Mobility  Bed Mobility Overal bed mobility: Needs Assistance Bed Mobility: Supine to Sit;Sit to Supine     Supine to sit: Mod assist;HOB elevated Sit to supine: Mod assist;HOB elevated   General bed mobility comments: difficulty even after offered a hand; has trunk tremor upon sitting, pt reports a recent problem this week.  Transfers Overall transfer level: Needs assistance Equipment used: 1 person hand held assist Transfers: Sit to/from Stand Sit to Stand: Min assist;From elevated surface         General transfer comment: Pt unable to come to standing fully; 80% of knee extension phase, but unable to perform more than 25% of trunk/hip extension phase. "I'm falling"  Ambulation/Gait Ambulation/Gait assistance:  (not safe to attempt as patient is unable to come to uprght stance.) Gait Distance (Feet):  (Pt largely nonambulatory, performs mostly pivot transfers at baseline.)            Stairs            Wheelchair Mobility    Modified Rankin (Stroke Patients Only)       Balance Overall balance assessment: Needs assistance;History of Falls Sitting-balance support: Single extremity supported;Feet unsupported Sitting balance-Leahy Scale: Fair     Standing balance support: During functional activity Standing balance-Leahy Scale: Zero  Pertinent Vitals/Pain Pain Assessment: No/denies pain    Home Living Family/patient expects to be discharged to:: Private residence Living Arrangements: Alone Available Help at Discharge: Family [place]) Type of Home: House Home Access: Stairs to enter;Ramped entrance   Entrance Stairs-Number of Steps: 3 Home Layout: One level Home Equipment: [place] - 2  wheels;Cane - single point;Shower seat;Wheelchair - power Additional Comments: [place] for all mobility needs in home and out; reports to perform SPT bed to/from [facility] ad lib, no other device used.    Prior Function Level of Independence: Needs assistance   Gait / Transfers Assistance Needed: power WC in and out of house; SPT without dvice  ADL's / Homemaking Assistance Needed: Independnet with tolietting; DTR assists with bathing, dressing.        Hand Dominance        Extremity/Trunk Assessment   Upper Extremity Assessment Upper Extremity Assessment: Generalized weakness    Lower Extremity Assessment Lower Extremity Assessment: Generalized weakness       Communication      Cognition Arousal/Alertness: Awake/alert Behavior During Therapy: WFL for tasks assessed/performed Overall Cognitive Status: Within Functional Limits for tasks assessed                                 General Comments: Sometimes circumferential in questioning regarding baseline function or acute deficits.      General Comments      Exercises     Assessment/Plan    PT Assessment Patient needs continued PT services  PT Problem List Decreased strength;Decreased activity tolerance;Decreased balance;Decreased mobility       PT Treatment Interventions DME instruction;Therapeutic exercise;Wheelchair mobility training;Functional mobility training;Balance training;Therapeutic activities;Patient/family education    PT Goals (Current goals can be found in the Care Plan section)  Acute Rehab PT Goals Patient Stated Goal: Regain strength and independence with SPT PT Goal Formulation: With patient Time For Goal Achievement: 12/17/19 Potential to Achieve Goals: Fair    Frequency Min 2X/week   Barriers to discharge Decreased caregiver support DTR works at night    Co-evaluation               AM-PAC PT "6 Clicks" Mobility  Outcome Measure Help needed turning from your back to  your side while in a flat bed without using bedrails?: A Lot Help needed moving from lying on your back to sitting on the side of a flat bed without using bedrails?: A Little Help needed moving to and from a bed to a chair (including a wheelchair)?: A Lot Help needed standing up from a chair using your arms (e.g., wheelchair or bedside chair)?: A Lot Help needed to walk in hospital room?: Total Help needed climbing 3-5 steps with a railing? : Total 6 Click Score: 11    End of Session   Activity Tolerance: Patient limited by fatigue;Treatment limited secondary to medical complications (Comment) Patient left: in bed;with call bell/phone within reach Nurse Communication: Mobility status PT Visit Diagnosis: Unsteadiness on feet (R26.81);Other abnormalities of gait and mobility (R26.89);Muscle weakness (generalized) (M62.81);Repeated falls (R29.6);Difficulty in walking, not elsewhere classified (R26.2)    Time: 1884-1660 PT Time Calculation (min) (ACUTE ONLY): 27 min   Charges:   PT Evaluation $PT Eval Moderate Complexity: 1 Mod PT Treatments $Therapeutic Exercise: 8-22 mins        5:34 PM, 12/03/19 Etta Grandchild, PT, DPT Physical Therapist - [facility]  [phone] [facility])  Gary Grant C 12/03/2019, 5:30 PM

## 2019-12-03 NOTE — ED Notes (Signed)
Pt's daughter updated on patient's plan of care.  Denies any other current needs or concerns.

## 2019-12-03 NOTE — ED Notes (Signed)
Pt provided with breakfast tray and was assisted with sitting up in the bed to eat.

## 2019-12-03 NOTE — ED Notes (Signed)
Consent e signed and on chart. Lab at bedside for recollect of type and screen.

## 2019-12-03 NOTE — ED Notes (Signed)
Secure chat Hosie Poisson MD to update on AM CBC results

## 2019-12-04 ENCOUNTER — Encounter: Payer: Self-pay | Admitting: Internal Medicine

## 2019-12-04 ENCOUNTER — Inpatient Hospital Stay: Payer: Medicare HMO

## 2019-12-04 DIAGNOSIS — G9341 Metabolic encephalopathy: Secondary | ICD-10-CM | POA: Diagnosis not present

## 2019-12-04 DIAGNOSIS — L8990 Pressure ulcer of unspecified site, unspecified stage: Secondary | ICD-10-CM | POA: Insufficient documentation

## 2019-12-04 DIAGNOSIS — N289 Disorder of kidney and ureter, unspecified: Secondary | ICD-10-CM | POA: Diagnosis not present

## 2019-12-04 DIAGNOSIS — R338 Other retention of urine: Secondary | ICD-10-CM | POA: Diagnosis not present

## 2019-12-04 DIAGNOSIS — N179 Acute kidney failure, unspecified: Secondary | ICD-10-CM | POA: Diagnosis not present

## 2019-12-04 LAB — URINE CULTURE: Culture: >=100000 — AB

## 2019-12-04 LAB — CBC
HCT: 26.6 % — ABNORMAL LOW (ref 39.0–52.0)
Hemoglobin: 8 g/dL — ABNORMAL LOW (ref 13.0–17.0)
MCH: 21.1 pg — ABNORMAL LOW (ref 26.0–34.0)
MCHC: 30.1 g/dL (ref 30.0–36.0)
MCV: 70 fL — ABNORMAL LOW (ref 80.0–100.0)
Platelets: 277 10*3/uL (ref 150–400)
RBC: 3.8 MIL/uL — ABNORMAL LOW (ref 4.22–5.81)
RDW: 20.9 % — ABNORMAL HIGH (ref 11.5–15.5)
WBC: 8.7 10*3/uL (ref 4.0–10.5)
nRBC: 0.3 % — ABNORMAL HIGH (ref 0.0–0.2)

## 2019-12-04 LAB — BASIC METABOLIC PANEL
Anion gap: 7 (ref 5–15)
BUN: 95 mg/dL — ABNORMAL HIGH (ref 8–23)
CO2: 22 mmol/L (ref 22–32)
Calcium: 8 mg/dL — ABNORMAL LOW (ref 8.9–10.3)
Chloride: 100 mmol/L (ref 98–111)
Creatinine, Ser: 3.84 mg/dL — ABNORMAL HIGH (ref 0.61–1.24)
GFR calc non Af Amer: 13 mL/min — ABNORMAL LOW (ref >60)
Glucose, Bld: 92 mg/dL (ref 70–99)
Potassium: 4.6 mmol/L (ref 3.5–5.1)
Sodium: 129 mmol/L — ABNORMAL LOW (ref 135–145)

## 2019-12-04 LAB — TYPE AND SCREEN
ABO/RH(D): A POS
Antibody Screen: NEGATIVE
Unit division: 0

## 2019-12-04 LAB — FOLATE: Folate: 10.3 ng/mL (ref >5.9)

## 2019-12-04 LAB — IRON AND TIBC
Iron: 20 ug/dL — ABNORMAL LOW (ref 45–182)
Saturation Ratios: 8 % — ABNORMAL LOW (ref 17.9–39.5)
TIBC: 267 ug/dL (ref 250–450)
UIBC: 247 ug/dL

## 2019-12-04 LAB — VITAMIN B12: Vitamin B-12: 2199 pg/mL — ABNORMAL HIGH (ref 180–914)

## 2019-12-04 LAB — BPAM RBC
Blood Product Expiration Date: 202110102359
ISSUE DATE / TIME: 202110061024
Unit Type and Rh: 600

## 2019-12-04 LAB — OSMOLALITY, URINE: Osmolality, Ur: 432 mOsm/kg (ref 300–900)

## 2019-12-04 LAB — FERRITIN: Ferritin: 67 ng/mL (ref 24–336)

## 2019-12-04 LAB — RETICULOCYTES
Immature Retic Fract: 46.1 % — ABNORMAL HIGH (ref 2.3–15.9)
RBC.: 3.91 MIL/uL — ABNORMAL LOW (ref 4.22–5.81)
Retic Count, Absolute: 95.8 10*3/uL (ref 19.0–186.0)
Retic Ct Pct: 2.5 % (ref 0.4–3.1)

## 2019-12-04 LAB — SODIUM, URINE, RANDOM: Sodium, Ur: 11 mmol/L

## 2019-12-04 MED ORDER — AMIODARONE HCL 200 MG PO TABS
200.0000 mg | ORAL_TABLET | Freq: Every day | ORAL | Status: DC
  Administered 2019-12-04: 200 mg via ORAL
  Filled 2019-12-04: qty 1

## 2019-12-04 MED ORDER — MIRTAZAPINE 15 MG PO TABS
15.0000 mg | ORAL_TABLET | Freq: Every day | ORAL | Status: DC
  Administered 2019-12-04: 15 mg via ORAL
  Filled 2019-12-04: qty 1

## 2019-12-04 MED ORDER — SODIUM CHLORIDE 0.9 % IV SOLN
1.0000 g | INTRAVENOUS | Status: DC
  Administered 2019-12-04: 1 g via INTRAVENOUS
  Filled 2019-12-04 (×3): qty 10

## 2019-12-04 MED ORDER — SODIUM CHLORIDE 0.9 % IV SOLN: 1.0000 g | INTRAVENOUS | Status: DC

## 2019-12-04 MED ORDER — TAMSULOSIN HCL 0.4 MG PO CAPS
0.4000 mg | ORAL_CAPSULE | Freq: Every day | ORAL | Status: DC
  Administered 2019-12-04: 0.4 mg via ORAL
  Filled 2019-12-04: qty 1

## 2019-12-04 MED ORDER — FERROUS SULFATE 325 (65 FE) MG PO TABS
325.0000 mg | ORAL_TABLET | ORAL | Status: DC
  Administered 2019-12-04: 325 mg via ORAL
  Filled 2019-12-04: qty 1

## 2019-12-04 MED ORDER — FUROSEMIDE 40 MG PO TABS
40.0000 mg | ORAL_TABLET | Freq: Every day | ORAL | Status: DC
  Administered 2019-12-04: 40 mg via ORAL
  Filled 2019-12-04: qty 1

## 2019-12-04 MED ORDER — CHLORHEXIDINE GLUCONATE CLOTH 2 % EX PADS
6.0000 | MEDICATED_PAD | Freq: Every day | CUTANEOUS | Status: DC
  Administered 2019-12-04: 6 via TOPICAL

## 2019-12-04 MED ORDER — APIXABAN 2.5 MG PO TABS
2.5000 mg | ORAL_TABLET | Freq: Two times a day (BID) | ORAL | Status: DC
  Administered 2019-12-04: 2.5 mg via ORAL
  Filled 2019-12-04: qty 1

## 2019-12-04 MED ORDER — LEVOTHYROXINE SODIUM 100 MCG PO TABS
100.0000 ug | ORAL_TABLET | Freq: Every day | ORAL | Status: DC
  Administered 2019-12-04: 100 ug via ORAL
  Filled 2019-12-04: qty 1

## 2019-12-05 NOTE — Progress Notes (Signed)
[facility] called and family notified.

## 2019-12-05 NOTE — Progress Notes (Signed)
Pt's family leaving the bedside. Spoke with [facility] and notified her of the requested funeral home.

## 2019-12-05 NOTE — Discharge Summary (Signed)
Death Summary  Gary Grant RCV:[id number] DOB: 11/21/1923 DOA: December 16, 2019  PCP: [facility] [place] date: 12-16-2019 Date of Death: 2019-12-18 Time of Death: June 03, 2318   History of present illness:  86 year old gentleman with prior history of coronary artery disease s/p CABG, paroxysmal atrial fibrillation, chronic diastolic heart failure, hypertension, hypothyroidism, BPH brought to ED for evaluation of generalized weakness, multiple falls and urinary retention.  On arrival to ED he was found to have urinary retention and bladder scan revealed 400 mL urine Foley catheter was placed and he was found to be in AKI. He was started on IV fluids and nephrotoxin meds have been discontinued. Patient reports that  his legs give away when he tries to ambulate, he denies any loss of consciousness during any of the falls. He appears deconditioned . On 10/6, pt be BP was borderline and his meds were held, and diuretics are held. Earlier this am pts hemoglobin dropped from 7.6 to 6.8, unclear if it was hemodilution, 1 unit of PRBC transfusion ordered and his hemoglobin improved.  Over the next 24 hours  creatinine improved to 3.8.  In view of his chronic medical issues, palliative care consulted for goals of care.     Final Diagnoses:    AKI (acute kidney injury) ([place]) Active Problems:   Falls   Hypothyroidism   Chronic diastolic heart failure (HCC)   HTN (hypertension)   CKD (chronic kidney disease), stage IV (HCC)   Acute urinary retention   Hypotension   Anemia of chronic kidney failure, stage 4 (severe) (HCC)   CAD (coronary artery disease)   Chronic anticoagulation   Acute metabolic encephalopathy   AF (paroxysmal atrial fibrillation) (HCC)   Elevated LFTs   Pressure injury of skin  Acute kidney injury superimposed on stage IV CKD.  Patient's baseline creatinine at 2.75 was admitted with a creatinine of 4.8 Gently hydrated and repeat renal parameters have improved to  3.8. CT of the abdomen and pelvis does not show any hydronephrosis. Foley catheter was placed.    Acute metabolic encephalopathy probably secondary to dehydration and AKI.  Resolved after IV fluids.    Generalized weakness, multiple falls probably secondary to deconditioning and dehydration. PT evaluation ordered for further evaluation, recommending SNF, .    Acute urinary retention Unclear etiology, no hydronephrosis on the CT, Foley catheter placed.  Urine analysis shows few bacteria and small leukocyte esterase.    UTI from E coli: Started the patient on IV Rocephin   Mild hypotension on admission which has resolved with IV fluids. Resolved.    Atrial fibrillation, paroxysmal Currently patient is in normal sinus rhythm, rate controlled with amiodarone. Initially eliquis was held when hemoglobin dropped from 7.6 to 6.8, resumed the next day.  His CHAD2 VASC2 score is 5 , Stroke risk was 7.2% per year.   Acquired thrombophilia On eliquis for anticoagulation for prevention of stroke.     Hypothyroidism:  Resume levothyroxine.    Anemia of acute illness superimposed on anemia of chronic disease from CKD. Patient's hemoglobin on admission was around 7.6 and after fluid bolus dropped to 6.8 probably from hemodilution.   Transfused 1 unit of PRBC and recheck hemoglobin posttransfusion is 8.     Hyponatremia:  BNP IS 131, elevated,  Get urine sodium , urine osmo and serum osmo, TSH and am cortisol.  Suspect hypervolemic hyponatremia from CKD.  Pt was on oral lasix 40 mg daily at home. Pt is also reported some sob, but on  exam no crackles on exam. Ordered 40 mg IV lasix.   Elevated liver enzymes:  Unclear etiology.  CT of the abdomen pelvis did not reveal any abnormalities.   [place] abd showed Small amount of sludge seen within gallbladder lumen. No other abnormality seen in the right upper quadrant of the abdomen.    Pressure Injury:  Pressure  Injury 12/03/19 Buttocks Right;Left Stage 2 -  Partial thickness loss of dermis presenting as a shallow open injury with a red, pink wound bed without slough. (Active)  12/03/19 2130  Location: Buttocks  Location Orientation: Right;Left  Staging: Stage 2 -  Partial thickness loss of dermis presenting as a shallow open injury with a red, pink wound bed without slough.  Wound Description (Comments):   Present on Admission: Yes   Wound care consulted and recommendations given.      In view of multiple falls, multiple comorbidities, anemia,  stage V CKD palliative care consulted for goals of care.  On the night of 12-30-2019 around 2320 , pt was found unresponsive without any spontaneous breathing or pulse.       The results of significant diagnostics from this hospitalization (including imaging, microbiology, ancillary and laboratory) are listed below for reference.    Significant Diagnostic Studies: CT ABDOMEN PELVIS WO CONTRAST  Result Date: 12/03/2019 CLINICAL DATA:  Urinary retention. EXAM: CT ABDOMEN AND PELVIS WITHOUT CONTRAST TECHNIQUE: Multidetector CT imaging of the abdomen and pelvis was performed following the standard protocol without IV contrast. COMPARISON:  May 02, 2015 FINDINGS: Lower chest: There is chronic scarring versus atelectasis at the lung bases bilaterally.The heart is enlarged. The intracardiac blood pool is hypodense relative to the adjacent myocardium consistent with anemia. Coronary artery calcifications are noted. Hepatobiliary: The liver is normal. Normal gallbladder.There is no biliary ductal dilation. Pancreas: Normal contours without ductal dilatation. No peripancreatic fluid collection. Spleen: Unremarkable. Adrenals/Urinary Tract: --Adrenal glands: Unremarkable. --Right kidney/ureter: There is a punctate nonobstructing stone in the upper pole the right kidney. --Left kidney/ureter: Multiple punctate nonobstructing stones are noted. --Urinary bladder:  Bladder is decompressed by Foley catheter and therefore is not well evaluated. Stomach/Bowel: --Stomach/Duodenum: No hiatal hernia or other gastric abnormality. Normal duodenal course and caliber. --Small bowel: Unremarkable. --Colon: Unremarkable. --Appendix: Normal. Vascular/Lymphatic: Atherosclerotic calcification is present within the non-aneurysmal abdominal aorta, without hemodynamically significant stenosis. --No retroperitoneal lymphadenopathy. --No mesenteric lymphadenopathy. --No pelvic or inguinal lymphadenopathy. Reproductive: Unremarkable Other: No ascites or free air. There are bilateral fat containing inguinal hernias, left greater than right. There is a fat containing periumbilical hernia. Musculoskeletal. No acute displaced fractures. IMPRESSION: 1. No acute abdominopelvic abnormality. 2. Bilateral fat containing inguinal hernias, left greater than right. 3. Fat containing periumbilical hernia. 4. Cardiomegaly and coronary artery disease. 5. Anemia. Aortic Atherosclerosis (ICD10-I70.0). Electronically Signed   By: Constance Holster M.D.   On: 12/03/2019 02:49   DG Chest Port 1 View  Result Date: 12/30/2019 CLINICAL DATA:  86 year old male with acute renal insufficiency. EXAM: PORTABLE CHEST 1 VIEW COMPARISON:  Portable chest 12/09/2019 and earlier. FINDINGS: Portable AP semi upright view at 1009 hours. Stable cardiomegaly and mediastinal contours. Prior CABG. Diffuse increased bilateral pulmonary interstitial opacity has not significantly changed, and remains increased compared to 2020. No pneumothorax. Small pleural effusions would be difficult to exclude. No consolidation. Negative visible bowel gas pattern. No acute osseous abnormality identified. IMPRESSION: Cardiomegaly with suspected pulmonary edema not significantly changed. Questionable small pleural effusions. Electronically Signed   By: Genevie Ann M.D.   On: 12-30-2019 10:24  DG Chest Portable 1 View  Result Date:  12/03/2019 CLINICAL DATA:  Shortness of breath.  Extremity swelling. EXAM: PORTABLE CHEST 1 VIEW COMPARISON:  Radiograph 09/10/2018 FINDINGS: Post median sternotomy and CABG. Mild cardiomegaly. There is increasing interstitial thickening suspicious for pulmonary edema. No large pleural effusion. No evidence of focal airspace disease. No pneumothorax. Degenerative change of both shoulders. IMPRESSION: Cardiomegaly with increasing interstitial thickening suspicious for pulmonary edema. Electronically Signed   By: Keith Rake M.D.   On: 12/07/2019 21:08   US Abdomen Limited RUQ  Result Date: 12/03/2019 CLINICAL DATA:  Elevated liver function tests. EXAM: ULTRASOUND ABDOMEN LIMITED RIGHT UPPER QUADRANT COMPARISON:  December 02, 2019.  May 05, 2015. FINDINGS: Gallbladder: No gallstones or wall thickening visualized. No sonographic Murphy sign noted by sonographer. Small amount of sludge is noted within the gallbladder lumen. Common bile duct: Diameter: 3 mm which is within normal limits. Liver: No focal lesion identified. Within normal limits in parenchymal echogenicity. Portal vein is patent on color Doppler imaging with normal direction of blood flow towards the liver. Other: None. IMPRESSION: Small amount of sludge seen within gallbladder lumen. No other abnormality seen in the right upper quadrant of the abdomen. Electronically Signed   By: Marijo Conception M.D.   On: 12/03/2019 15:02    Microbiology: Recent Results (from the past 240 hour(s))  Urine culture     Status: Abnormal   Collection Time: 12/01/2019  9:08 PM   Specimen: Urine, Random  Result Value Ref Range Status   Specimen Description   Final    URINE, RANDOM Performed at [facility], [address] [place], [place] [address]    Special Requests   Final    NONE Performed at [facility], [address] [place], [place] [address]    Culture >=100,000 COLONIES/mL ESCHERICHIA COLI (A)  Final   Report Status  December 30, 2019 FINAL  Final   Organism ID, Bacteria ESCHERICHIA COLI (A)  Final      Susceptibility   Escherichia coli - MIC*    AMPICILLIN <=2 SENSITIVE Sensitive     CEFAZOLIN <=4 SENSITIVE Sensitive     CEFTRIAXONE <=0.25 SENSITIVE Sensitive     CIPROFLOXACIN <=0.25 SENSITIVE Sensitive     GENTAMICIN <=1 SENSITIVE Sensitive     IMIPENEM <=0.25 SENSITIVE Sensitive     NITROFURANTOIN <=16 SENSITIVE Sensitive     TRIMETH/SULFA <=20 SENSITIVE Sensitive     AMPICILLIN/SULBACTAM <=2 SENSITIVE Sensitive     PIP/TAZO <=4 SENSITIVE Sensitive     * >=100,000 COLONIES/mL ESCHERICHIA COLI  Respiratory Panel by RT PCR (Flu A&B, Covid) - Nasopharyngeal Swab     Status: None   Collection Time: 12/05/2019 10:36 PM   Specimen: Nasopharyngeal Swab  Result Value Ref Range Status   SARS Coronavirus 2 by RT PCR NEGATIVE NEGATIVE Final    Comment: (NOTE) SARS-CoV-2 target nucleic acids are NOT DETECTED.  The SARS-CoV-2 RNA is generally detectable in upper respiratoy specimens during the acute phase of infection. The lowest concentration of SARS-CoV-2 viral copies this assay can detect is 131 copies/mL. A negative result does not preclude SARS-Cov-2 infection and should not be used as the sole basis for treatment or other patient management decisions. A negative result may occur with  improper specimen collection/handling, submission of specimen other than nasopharyngeal swab, presence of viral mutation(s) within the areas targeted by this assay, and inadequate number of viral copies (<131 copies/mL). A negative result must be combined with clinical observations, patient history, and  epidemiological information. The expected result is Negative.  Fact Sheet for Patients:  PinkCheek.be  Fact Sheet for Healthcare Providers:  GravelBags.it  This test is no t yet approved or cleared by the [place] FDA and  has been authorized for detection  and/or diagnosis of SARS-CoV-2 by FDA under an Emergency Use Authorization (EUA). This EUA will remain  in effect (meaning this test can be used) for the duration of the COVID-19 declaration under Section 564(b)(1) of the Act, 21 U.S.C. section 360bbb-3(b)(1), unless the authorization is terminated or revoked sooner.     Influenza A by PCR NEGATIVE NEGATIVE Final   Influenza B by PCR NEGATIVE NEGATIVE Final    Comment: (NOTE) The Xpert Xpress SARS-CoV-2/FLU/RSV assay is intended as an aid in  the diagnosis of influenza from Nasopharyngeal swab specimens and  should not be used as a sole basis for treatment. Nasal washings and  aspirates are unacceptable for Xpert Xpress SARS-CoV-2/FLU/RSV  testing.  Fact Sheet for Patients: PinkCheek.be  Fact Sheet for Healthcare Providers: GravelBags.it  This test is not yet approved or cleared by the [place] FDA and  has been authorized for detection and/or diagnosis of SARS-CoV-2 by  FDA under an Emergency Use Authorization (EUA). This EUA will remain  in effect (meaning this test can be used) for the duration of the  Covid-19 declaration under Section 564(b)(1) of the Act, 21  U.S.C. section 360bbb-3(b)(1), unless the authorization is  terminated or revoked. Performed at [facility], [address] [place], [place] [address]      Labs: Basic Metabolic Panel: Recent Labs  Lab 12/26/2019 2107 12/06/2019 2107 12/03/19 0615 2019-12-12 0716  NA 130*  --  130* 129*  K 4.4   < > 4.2 4.6  CL 96*  --  98 100  CO2 24  --  24 22  GLUCOSE 131*  --  95 92  BUN 119*  --  109* 95*  CREATININE 4.84*  --  4.64* 3.84*  CALCIUM 8.1*  --  7.8* 8.0*   < > = values in this interval not displayed.   Liver Function Tests: Recent Labs  Lab 12/22/2019 2107  AST 128*  ALT 162*  ALKPHOS 108  BILITOT 0.8  PROT 5.8*  ALBUMIN 2.8*   No results for input(s): LIPASE, AMYLASE in the  last 168 hours. No results for input(s): AMMONIA in the last 168 hours. CBC: Recent Labs  Lab 12/25/2019 2107 12/03/19 0615 2019/12/12 0716  WBC 8.3 8.4 8.7  NEUTROABS 6.8  --   --   HGB 7.6* 6.8* 8.0*  HCT 26.5* 22.3* 26.6*  MCV 68.3* 66.2* 70.0*  PLT 296 284 277   Cardiac Enzymes: No results for input(s): CKTOTAL, CKMB, CKMBINDEX, TROPONINI in the last 168 hours. D-Dimer No results for input(s): DDIMER in the last 72 hours. BNP: Invalid input(s): POCBNP CBG: No results for input(s): GLUCAP in the last 168 hours. Anemia work up Recent Labs    Dec 12, 2019 1331 2019/12/12 1358  VITAMINB12 2,199*  --   FOLATE 10.3  --   FERRITIN 67  --   TIBC 267  --   IRON 20*  --   RETICCTPCT  --  2.5   Urinalysis    Component Value Date/Time   COLORURINE YELLOW (A) 12/03/2019 2107   APPEARANCEUR HAZY (A) 12/23/2019 2107   APPEARANCEUR Clear 05/30/2013 2025   LABSPEC 1.010 12/01/2019 2107   LABSPEC 1.010 05/30/2013 2025   PHURINE 5.0 12/03/2019 2107   GLUCOSEU  NEGATIVE 11/30/2019 2107   GLUCOSEU Negative 05/30/2013 2025   HGBUR NEGATIVE 12/01/2019 2107   BILIRUBINUR NEGATIVE 12/13/2019 2107   BILIRUBINUR Negative 05/30/2013 2025   [place] 12/17/2019 2107   PROTEINUR NEGATIVE 12/25/2019 2107   NITRITE NEGATIVE 12/23/2019 2107   LEUKOCYTESUR SMALL (A) 12/15/2019 2107   LEUKOCYTESUR Negative 05/30/2013 2025   Sepsis Labs Invalid input(s): PROCALCITONIN,  WBC,  LACTICIDVEN     SIGNED:  Hosie Poisson, MD  [facility] 12/05/2019, 8:58 AM

## 2019-12-29 NOTE — Progress Notes (Signed)
Cross Cover Note Brief death note Patient found by nurse unresponsive, no spontaneous breathing or pulse.  Patient had DNR status Death confirmed with absence of spontaneous breathing, neuro response or pulse.    Time of death 2318-05-24 on 12/08/2019

## 2019-12-29 NOTE — NC FL2 (Signed)
[place] LEVEL OF CARE SCREENING TOOL     IDENTIFICATION  Patient Name: Gary Grant Birthdate: Mar 21, 1923 Sex: male Admission Date (Current Location): 12/18/2019  [facility] and [place] Number:  Engineering geologist and Address:         Provider Number: [id number]  Attending Physician Name and Address:  Hosie Poisson, MD  Relative Name and Phone Number:       Current Level of Care: Hospital Recommended Level of Care: [place] Prior Approval Number:    Date Approved/Denied:   PASRR Number: [id number]  Discharge Plan: SNF    Current Diagnoses: Patient Active Problem List   Diagnosis Date Noted  . Pressure injury of skin 12-Dec-2019  . CKD (chronic kidney disease), stage IV ([place]) 12/18/2019  . Acute urinary retention 12/15/2019  . Hypotension 12/23/2019  . Anemia of chronic kidney failure, stage 4 (severe) ([place]) 12/15/2019  . Chronic anticoagulation 12/07/2019  . Acute metabolic encephalopathy 67/61/9509  . AF (paroxysmal atrial fibrillation) ([place]) 12/23/2019  . Elevated LFTs 12/08/2019  . Chronic diastolic heart failure ([place]) 09/25/2017  . HTN (hypertension) 09/25/2017  . Lymphedema 09/25/2017  . Atrial fibrillation with RVR ([place]) 04/17/2017  . NSTEMI (non-ST elevated myocardial infarction) ([place]) 12/20/2015  . Leg weakness, bilateral 11/29/2015  . Falls 11/29/2015  . Degenerative disc disease, lumbar 11/29/2015  . Back pain 11/29/2015  . Bradycardia, drug induced 11/29/2015  . Dehydration 11/29/2015  . Anemia 11/29/2015  . Hypothyroidism 11/29/2015  . Weakness 11/26/2015  . Nephrolithiasis 05/07/2015  . Constipation 05/07/2015  . AKI (acute kidney injury) ([place]) 05/05/2015  . CAD (coronary artery disease) 07/04/2013    Orientation RESPIRATION BLADDER Height & Weight     Self, Time, Situation, Place  Normal Incontinent, Indwelling catheter Weight: 97.2 kg Height:  6' (182.9 cm)  BEHAVIORAL SYMPTOMS/MOOD NEUROLOGICAL  BOWEL NUTRITION STATUS      Incontinent Diet (2 gram sodium)  AMBULATORY STATUS COMMUNICATION OF NEEDS Skin   Extensive Assist Verbally PU Stage and Appropriate Care                       Personal Care Assistance Level of Assistance              Functional Limitations Info             SPECIAL CARE FACTORS FREQUENCY  PT (By licensed PT), OT (By licensed OT)                    Contractures Contractures Info: Not present    Additional Factors Info  Code Status, Allergies Code Status Info: DNR Allergies Info: NKDA           Current Medications (12-Dec-2019):  This is the current hospital active medication list Current Facility-Administered Medications  Medication Dose Route Frequency Provider Last Rate Last Admin  . acetaminophen (TYLENOL) tablet 650 mg  650 mg Oral Q6H PRN Athena Masse, MD       Or  . acetaminophen (TYLENOL) suppository 650 mg  650 mg Rectal Q6H PRN Athena Masse, MD      . Chlorhexidine Gluconate Cloth 2 % PADS 6 each  6 each Topical Daily Hosie Poisson, MD   6 each at 12/12/19 0915  . ondansetron (ZOFRAN) tablet 4 mg  4 mg Oral Q6H PRN Athena Masse, MD       Or  . ondansetron [facility]) injection 4 mg  4 mg Intravenous Q6H PRN Judd Gaudier  V, MD         Discharge Medications: Please see discharge summary for a list of discharge medications.  Relevant Imaging Results:  Relevant Lab Results:   Additional Information ss [id number]  Beverly Sessions, RN

## 2019-12-29 NOTE — Progress Notes (Signed)
NP notified that pt did not have a pulse. NP on her way up to the floor.

## 2019-12-29 NOTE — Progress Notes (Signed)
Physical Therapy Treatment Patient Details Name: Gary Grant MRN: [id number] DOB: 20-May-1923 Today's Date: 2019/12/31    History of Present Illness Gary Grant is a 65yoM (PMH: CAD s/p CABG, PAF, dCHF, hypoTSH, BPH) comes to [facility] ED on 10/5 due to acute onset weakness, multiple falls, urinary retention. Pt noted to have anemia c post hydration Hb drop into 6s, received blood in ED. Per paliative note, baseline includes very minimal mobility at home due to chronic leg weakness. Per chart review, pt stopped driving in 8676, DTR helps with transportation. Pt has a history of chronic LE buckling, began using a RW for gait in 2019, but now reports to perform most mobility c power WC.    PT Comments    Patient agreeable to physical therapy. Patient needs Mod A for bed mobility with good sitting balance demonstrated when sitting upright. Patient was able to perform incremental scooting x 4 bouts along edge of bed with Min guard and cues for technique. Attempted sit to stand transfer and patient was unable to clear hips for standing despite maximal assistance. Patient was fatigued with minimal activity with mild shortness of breath after activity. Sp02 87% initially on room air after activity and increased into low 90's with 2 minute rest break and cues for breathing technique. Recommend to continue PT to maximize function and address functional limitations remaining. SNF remains an appropriate discharge plan at this time.     Follow Up Recommendations  SNF     Equipment Recommendations  None recommended by PT    Recommendations for Other Services       Precautions / Restrictions Precautions Precautions: Fall Precaution Comments: Low HB; Restrictions Weight Bearing Restrictions: No    Mobility  Bed Mobility Overal bed mobility: Needs Assistance Bed Mobility: Supine to Sit;Sit to Supine Rolling: Mod assist   Supine to sit: Mod assist Sit to supine: Mod assist   General bed  mobility comments: verbal cues for sequencing and technique. assistance for trunk and BLE to sit up on edge of bed   Transfers Overall transfer level: Needs assistance               General transfer comment: attempted sit to stand transfer. patient unable to clear hips from bed surface with maximal assistance. incrimental scooting to right side x 4 bouts with Min guard and verbal cues for technique. patient is fatigued with activity   Ambulation/Gait                 Stairs             Wheelchair Mobility    Modified Rankin (Stroke Patients Only)       Balance Overall balance assessment: Needs assistance;History of Falls Sitting-balance support: Feet supported Sitting balance-Leahy Scale: Fair                                      Cognition Arousal/Alertness: Awake/alert Behavior During Therapy: WFL for tasks assessed/performed Overall Cognitive Status: Within Functional Limits for tasks assessed                                        Exercises Other Exercises Other Exercises: Engaged in bed mobility, self-feeding, grooming. Educ pt re: role of OT, POC, breathing techniques to allievate SOB, DC possibilities, role of palliative care.  General Comments General comments (skin integrity, edema, etc.): pressure sore on R buttocks      Pertinent Vitals/Pain Pain Assessment: No/denies pain    Home Living Family/patient expects to be discharged to:: Private residence Living Arrangements: Children Available Help at Discharge: Family;Available PRN/intermittently (daughter available during day, works nights) Type of Home: House Home Access: Stairs to enter;Ramped entrance   [place]: One level Home Equipment: Environmental consultant - 2 wheels;Cane - single point;Shower seat;Wheelchair - power Additional Comments: [place] for all mobility needs in home and out; reports to perform SPT bed to/from [facility] ad lib, no other device used. WC does  not go through bathroom door, so pt walks from [facility] to toilet    Prior Function Level of Independence: Needs assistance  Gait / Transfers Assistance Needed: power WC in and out of house; SPT without device ADL's / Homemaking Assistance Needed: Pt reports he is IND w/ toileting, including walking from bathroom door to toilet. Daughter assists with dressing, bathing, cooking.     PT Goals (current goals can now be found in the care plan section) Acute Rehab PT Goals Patient Stated Goal: to go home and not to a SNF PT Goal Formulation: With patient Time For Goal Achievement: 12/17/19 Potential to Achieve Goals: Fair Progress towards PT goals: Progressing toward goals    Frequency    Min 2X/week      PT Plan Current plan remains appropriate    Co-evaluation              AM-PAC PT "6 Clicks" Mobility   Outcome Measure  Help needed turning from your back to your side while in a flat bed without using bedrails?: A Lot Help needed moving from lying on your back to sitting on the side of a flat bed without using bedrails?: A Lot Help needed moving to and from a bed to a chair (including a wheelchair)?: A Lot Help needed standing up from a chair using your arms (e.g., wheelchair or bedside chair)?: A Lot Help needed to walk in hospital room?: Total Help needed climbing 3-5 steps with a railing? : Total 6 Click Score: 10    End of Session   Activity Tolerance: Patient tolerated treatment well Patient left: in bed;with call bell/phone within reach;with bed alarm set   PT Visit Diagnosis: Unsteadiness on feet (R26.81);Other abnormalities of gait and mobility (R26.89);Muscle weakness (generalized) (M62.81);Repeated falls (R29.6);Difficulty in walking, not elsewhere classified (R26.2)     Time: 0981-1914 PT Time Calculation (min) (ACUTE ONLY): 25 min  Charges:  $Therapeutic Activity: 23-37 mins                     Minna Merritts, PT, MPT    Percell Locus 12/31/19,  1:34 PM

## 2019-12-29 NOTE — TOC Initial Note (Signed)
Transition of Care [facility]) - Initial/Assessment Note    Patient Details  Name: Gary Grant MRN: [id number] Date of Birth: 19-Jan-1924  [facility] CM/SW Contact:    Beverly Sessions, RN Phone Number: 12-16-19, 3:20 PM  Clinical Narrative:                 Patient admitted from home with AKI Patient lives at home with daughter  Pharmacy and PCP - [place] [facility]  Daughter provides transportation   Patient has Rw, cane, shower seat, and power WC in the home    Patient is fully covid vaccinated  PT has assessed patient and recommends SNF.  Daughter is in agreement.  Patient is in agreement to a bed search, but is not sure if he wants to go or not.  He will discuss with his family  Existing PASRR Fl2 sent for signature Bed search initiated   Expected Discharge Plan: Skilled Nursing Facility Barriers to Discharge: No Barriers Identified   Patient Goals and CMS Choice        Expected Discharge Plan and Services Expected Discharge Plan: [place] arrangements for the past 2 months: Single Family Home                                      Prior Living Arrangements/Services Living arrangements for the past 2 months: Single Family Home Lives with:: Adult Children Patient language and need for interpreter reviewed:: Yes Do you feel safe going back to the place where you live?: Yes      Need for Family Participation in Patient Care: Yes (Comment) Care giver support system in place?: Yes (comment) Current home services: DME Criminal Activity/Legal Involvement Pertinent to Current Situation/Hospitalization: No - Comment as needed  Activities of Daily Living Home Assistive Devices/Equipment: Wheelchair ADL Screening (condition at time of admission) Patient's cognitive ability adequate to safely complete daily activities?: No Is the patient deaf or have difficulty hearing?: Yes Does the patient have difficulty  seeing, even when wearing glasses/contacts?: No Does the patient have difficulty concentrating, remembering, or making decisions?: Yes Patient able to express need for assistance with ADLs?: Yes Does the patient have difficulty dressing or bathing?: Yes Independently performs ADLs?: No Does the patient have difficulty walking or climbing stairs?: Yes Weakness of Legs: Both Weakness of Arms/Hands: Both  Permission Sought/Granted                  Emotional Assessment Appearance:: Appears stated age     Orientation: : Oriented to Place, Oriented to Self, Oriented to  Time, Oriented to Situation      Admission diagnosis:  Hepatitis [K75.9] Uremic encephalopathy [G93.49, N19] Elevated LFTs [R79.89] Acute on chronic renal insufficiency [N28.9, N18.9] Generalized weakness [R53.1] AKI (acute kidney injury) ([place]) [N17.9] Chronic congestive heart failure, unspecified heart failure type ([place]) [I50.9] Patient Active Problem List   Diagnosis Date Noted  . Pressure injury of skin 12-16-2019  . CKD (chronic kidney disease), stage IV ([place]) 12/05/2019  . Acute urinary retention 12/26/2019  . Hypotension 12/19/2019  . Anemia of chronic kidney failure, stage 4 (severe) ([place]) 12/14/2019  . Chronic anticoagulation 12/15/2019  . Acute metabolic encephalopathy 37/90/2409  . AF (paroxysmal atrial fibrillation) ([place]) 12/22/2019  . Elevated LFTs 12/24/2019  . Chronic diastolic heart failure ([place]) 09/25/2017  . HTN (hypertension) 09/25/2017  . Lymphedema  09/25/2017  . Atrial fibrillation with RVR ([place]) 04/17/2017  . NSTEMI (non-ST elevated myocardial infarction) ([place]) 12/20/2015  . Leg weakness, bilateral 11/29/2015  . Falls 11/29/2015  . Degenerative disc disease, lumbar 11/29/2015  . Back pain 11/29/2015  . Bradycardia, drug induced 11/29/2015  . Dehydration 11/29/2015  . Anemia 11/29/2015  . Hypothyroidism 11/29/2015  . Weakness 11/26/2015  . Nephrolithiasis 05/07/2015  .  Constipation 05/07/2015  . AKI (acute kidney injury) ([place]) 05/05/2015  . CAD (coronary artery disease) 07/04/2013   PCP:  [facility] [place]:   [place] [place] [address] [place] [address] Phone: [phone] Fax: [phone]     Social Determinants of Health (SDOH) Interventions    Readmission Risk Interventions No flowsheet data found.

## 2019-12-29 NOTE — Consult Note (Signed)
WOC Nurse Consult Note: Reason for Consult:stage 2 pressure injury to right gluteal fold.  Complicated by urinary incontinence and decreased mobility.  Wound type: Incontinence and pressure injury Pressure Injury POA: Yes Measurement: 3 cm x 2.2 cm x 0.1 cm  Wound bed: pale pink and moist Drainage (amount, consistency, odor) minimal serosanguinous  No  Periwound:intact  Frequently moist Dressing procedure/placement/frequency: Cleanse buttocks wound with NS and pat dry. Apply silicone foam dressing. Change every three days and PRn soilage. Keep skin clean and dry.  Prompt incontinence care.  Will not follow at this time.  Please re-consult if needed.  Domenic Moras MSN, RN, FNP-BC CWON Wound, Ostomy, Continence Nurse Pager [phone]

## 2019-12-29 NOTE — Progress Notes (Signed)
PROGRESS NOTE    Gary Grant  CBJ:[id number] DOB: 1923-03-20 DOA: 12/20/2019 PCP: [facility]   Chief Complaint  Patient presents with  . Leg Swelling  . Weakness    Brief Narrative:  86 year old gentleman with prior history of coronary artery disease s/p CABG, paroxysmal atrial fibrillation, chronic diastolic heart failure, hypertension, hypothyroidism, BPH brought to ED for evaluation of generalized weakness, multiple falls and urinary retention.  On arrival to ED he was found to have urinary retention and bladder scan revealed 400 mL urine Foley catheter was placed and he was found to be in AKI. He was started on IV fluids and nephrotoxin meds have been discontinued.  His creatinine improved to 3.8. in view of his chronic medical issues, palliative care consulted for goals of care.    Assessment & Plan:   Principal Problem:   AKI (acute kidney injury) ([place]) Active Problems:   Falls   Hypothyroidism   Chronic diastolic heart failure (HCC)   HTN (hypertension)   CKD (chronic kidney disease), stage IV (HCC)   Acute urinary retention   Hypotension   Anemia of chronic kidney failure, stage 4 (severe) (HCC)   CAD (coronary artery disease)   Chronic anticoagulation   Acute metabolic encephalopathy   AF (paroxysmal atrial fibrillation) (HCC)   Elevated LFTs   Pressure injury of skin   Acute kidney injury superimposed on stage IV CKD.  Patient's baseline creatinine at 2.75 was admitted with a creatinine of 4.8 Gently hydrated and repeat renal parameters have improved to 3.8. CT of the abdomen and pelvis does not show any hydronephrosis. Foley catheter was placed. Will plan for voiding trial in am.    Acute metabolic encephalopathy Appears to have resolved, probably secondary to AKI from dehydration.   Generalized weakness, multiple falls probably secondary to deconditioning and dehydration. PT evaluation ordered for further evaluation, recommending  SNF, toc is aware.    Acute urinary retention Unclear etiology, no hydronephrosis on the CT, Foley catheter placed.  Urine analysis shows few bacteria and small leukocyte esterase. Cultures, patient currently denies any dysuria.   UTI from E coli: Started the patient on IV Rocephin, plan to transition to oral keflex on discharge.    Mild hypotension on admission which has resolved with IV fluids. Resolved.    Atrial fibrillation, paroxysmal Currently patient is in normal sinus rhythm, rate controlled with amiodarone.   Continue with Eliquis for anticoagulation.   Hypothyroidism:  Resume levothyroxine.    Anemia of acute illness superimposed on anemia of chronic disease from CKD. Patient's hemoglobin on admission was around 7.6 and after fluid bolus dropped to 6.8 probably from hemodilution.   Transfused 1 unit of PRBC and recheck hemoglobin posttransfusion.   Will get stool for occult blood and anemia panel for further evaluation. Low iron levels revealed, sordered iron supplementation.   Hyponatremia:  BNP IS 131.  ? From diuresis. Recommend holding lasix, recheck sodium is 129.  Get urine sodium , urine osmo and serum osmo, TSH and am cortisol.  Suspect hypervolemic hyponatremia from CKD. Will start him on lasix 40 mg daily.    Elevated liver enzymes:  Unclear etiology.  CT of the abdomen pelvis did not reveal any abnormalities.   [place] abd showed Small amount of sludge seen within gallbladder lumen. No other abnormality seen in the right upper quadrant of the abdomen.    Pressure Injury:  Pressure Injury 12/03/19 Buttocks Right;Left Stage 2 -  Partial thickness loss of dermis presenting  as a shallow open injury with a red, pink wound bed without slough. (Active)  12/03/19 2130  Location: Buttocks  Location Orientation: Right;Left  Staging: Stage 2 -  Partial thickness loss of dermis presenting as a shallow open injury with a red, pink wound bed without slough.    Wound Description (Comments):   Present on Admission: Yes   Wound care consulted and recommendations given.      In view of multiple falls, multiple comorbidities, anemia stage V CKD palliative care consulted for goals of care. At this time patient would like to continue with the treatable conditions and go to short term rehab and followup with palliative as outpatient.     DVT prophylaxis: eliquis.  code Status: DNR Family Communication: None at bedside, called his daughter Disposition:   Status is: Inpatient  Remains inpatient appropriate because:Ongoing diagnostic testing needed not appropriate for outpatient work up, Unsafe d/c plan, IV treatments appropriate due to intensity of illness or inability to take PO and Inpatient level of care appropriate due to severity of illness   Dispo: The patient is from: Home              Anticipated d/c is to: Pending              Anticipated d/c date is: 2 days              Patient currently is not medically stable to d/c.       Consultants:   Palliative consult  Procedures: None Antimicrobials: None  Subjective: Multiple falls in the last month. Wants to try rehab for short term, denies any chest pain or sob.   Objective: Vitals:   12-30-19 0343 2019/12/30 0921 2019/12/30 1146 2019/12/30 1310  BP: 135/66 (!) 139/59 (!) 105/52 (!) 139/59  Pulse: 81 79 84   Resp: (!) 22     Temp: 98.2 F (36.8 C) (!) 97.5 F (36.4 C) 97.7 F (36.5 C)   TempSrc: Oral Oral    SpO2:  100% 91%   Weight: 97.2 kg     Height: 6' (1.829 m)       Intake/Output Summary (Last 24 hours) at 12/30/2019 1334 Last data filed at 12/30/2019 4098 Gross per 24 hour  Intake 120 ml  Output 900 ml  Net -780 ml   Filed Weights   12/03/19 2136 12/30/2019 0343  Weight: 97.2 kg 97.2 kg    Examination:  General exam: Alert and comfortable Respiratory system: Diminished air entry at bases, tachypnea present on room air Cardiovascular system: S1-S2 heard,  regular rate rhythm, JVD cannot be appreciated, pedal edema present gastrointestinal system: Abdomen is soft, nontender, bowel sounds normal nondistended Central nervous system: Alert and grossly nonfocal, very hard of hearing Extremities: Bilateral lower extremity edema present Skin: Stage II pressure injury on the Left buttock present on admission Psychiatry: Mood is appropriate    Data Reviewed: I have personally reviewed following labs and imaging studies  CBC: Recent Labs  Lab 11/29/2019 2107 12/03/19 0615 2019-12-30 0716  WBC 8.3 8.4 8.7  NEUTROABS 6.8  --   --   HGB 7.6* 6.8* 8.0*  HCT 26.5* 22.3* 26.6*  MCV 68.3* 66.2* 70.0*  PLT 296 284 119    Basic Metabolic Panel: Recent Labs  Lab 12/13/2019 2107 12/03/19 0615 30-Dec-2019 0716  NA 130* 130* 129*  K 4.4 4.2 4.6  CL 96* 98 100  CO2 24 24 22   GLUCOSE 131* 95 92  BUN 119* 109* 95*  CREATININE 4.84* 4.64* 3.84*  CALCIUM 8.1* 7.8* 8.0*    GFR: Estimated Creatinine Clearance: 14.5 mL/min (A) (by C-G formula based on SCr of 3.84 mg/dL (H)).  Liver Function Tests: Recent Labs  Lab 12/10/2019 2107  AST 128*  ALT 162*  ALKPHOS 108  BILITOT 0.8  PROT 5.8*  ALBUMIN 2.8*    CBG: No results for input(s): GLUCAP in the last 168 hours.   Recent Results (from the past 240 hour(s))  Urine culture     Status: Abnormal   Collection Time: 12/23/2019  9:08 PM   Specimen: Urine, Random  Result Value Ref Range Status   Specimen Description   Final    URINE, RANDOM Performed at [facility], [address], [place], [place] [address]    Special Requests   Final    NONE Performed at [facility], [address] [place], [place] [address]    Culture >=100,000 COLONIES/mL ESCHERICHIA COLI (A)  Final   Report Status Dec 30, 2019 FINAL  Final   Organism ID, Bacteria ESCHERICHIA COLI (A)  Final      Susceptibility   Escherichia coli - MIC*    AMPICILLIN <=2 SENSITIVE Sensitive     CEFAZOLIN <=4 SENSITIVE  Sensitive     CEFTRIAXONE <=0.25 SENSITIVE Sensitive     CIPROFLOXACIN <=0.25 SENSITIVE Sensitive     GENTAMICIN <=1 SENSITIVE Sensitive     IMIPENEM <=0.25 SENSITIVE Sensitive     NITROFURANTOIN <=16 SENSITIVE Sensitive     TRIMETH/SULFA <=20 SENSITIVE Sensitive     AMPICILLIN/SULBACTAM <=2 SENSITIVE Sensitive     PIP/TAZO <=4 SENSITIVE Sensitive     * >=100,000 COLONIES/mL ESCHERICHIA COLI  Respiratory Panel by RT PCR (Flu A&B, Covid) - Nasopharyngeal Swab     Status: None   Collection Time: 12/25/2019 10:36 PM   Specimen: Nasopharyngeal Swab  Result Value Ref Range Status   SARS Coronavirus 2 by RT PCR NEGATIVE NEGATIVE Final    Comment: (NOTE) SARS-CoV-2 target nucleic acids are NOT DETECTED.  The SARS-CoV-2 RNA is generally detectable in upper respiratoy specimens during the acute phase of infection. The lowest concentration of SARS-CoV-2 viral copies this assay can detect is 131 copies/mL. A negative result does not preclude SARS-Cov-2 infection and should not be used as the sole basis for treatment or other patient management decisions. A negative result may occur with  improper specimen collection/handling, submission of specimen other than nasopharyngeal swab, presence of viral mutation(s) within the areas targeted by this assay, and inadequate number of viral copies (<131 copies/mL). A negative result must be combined with clinical observations, patient history, and epidemiological information. The expected result is Negative.  Fact Sheet for Patients:  PinkCheek.be  Fact Sheet for Healthcare Providers:  GravelBags.it  This test is no t yet approved or cleared by the [place] FDA and  has been authorized for detection and/or diagnosis of SARS-CoV-2 by FDA under an Emergency Use Authorization (EUA). This EUA will remain  in effect (meaning this test can be used) for the duration of the COVID-19 declaration  under Section 564(b)(1) of the Act, 21 U.S.C. section 360bbb-3(b)(1), unless the authorization is terminated or revoked sooner.     Influenza A by PCR NEGATIVE NEGATIVE Final   Influenza B by PCR NEGATIVE NEGATIVE Final    Comment: (NOTE) The Xpert Xpress SARS-CoV-2/FLU/RSV assay is intended as an aid in  the diagnosis of influenza from Nasopharyngeal swab specimens and  should not be used as a sole basis for treatment.  Nasal washings and  aspirates are unacceptable for Xpert Xpress SARS-CoV-2/FLU/RSV  testing.  Fact Sheet for Patients: PinkCheek.be  Fact Sheet for Healthcare Providers: GravelBags.it  This test is not yet approved or cleared by the [place] FDA and  has been authorized for detection and/or diagnosis of SARS-CoV-2 by  FDA under an Emergency Use Authorization (EUA). This EUA will remain  in effect (meaning this test can be used) for the duration of the  Covid-19 declaration under Section 564(b)(1) of the Act, 21  U.S.C. section 360bbb-3(b)(1), unless the authorization is  terminated or revoked. Performed at [facility], [address] [place], [place] [address]          Radiology Studies: CT ABDOMEN PELVIS WO CONTRAST  Result Date: 12/03/2019 CLINICAL DATA:  Urinary retention. EXAM: CT ABDOMEN AND PELVIS WITHOUT CONTRAST TECHNIQUE: Multidetector CT imaging of the abdomen and pelvis was performed following the standard protocol without IV contrast. COMPARISON:  May 02, 2015 FINDINGS: Lower chest: There is chronic scarring versus atelectasis at the lung bases bilaterally.The heart is enlarged. The intracardiac blood pool is hypodense relative to the adjacent myocardium consistent with anemia. Coronary artery calcifications are noted. Hepatobiliary: The liver is normal. Normal gallbladder.There is no biliary ductal dilation. Pancreas: Normal contours without ductal dilatation. No  peripancreatic fluid collection. Spleen: Unremarkable. Adrenals/Urinary Tract: --Adrenal glands: Unremarkable. --Right kidney/ureter: There is a punctate nonobstructing stone in the upper pole the right kidney. --Left kidney/ureter: Multiple punctate nonobstructing stones are noted. --Urinary bladder: Bladder is decompressed by Foley catheter and therefore is not well evaluated. Stomach/Bowel: --Stomach/Duodenum: No hiatal hernia or other gastric abnormality. Normal duodenal course and caliber. --Small bowel: Unremarkable. --Colon: Unremarkable. --Appendix: Normal. Vascular/Lymphatic: Atherosclerotic calcification is present within the non-aneurysmal abdominal aorta, without hemodynamically significant stenosis. --No retroperitoneal lymphadenopathy. --No mesenteric lymphadenopathy. --No pelvic or inguinal lymphadenopathy. Reproductive: Unremarkable Other: No ascites or free air. There are bilateral fat containing inguinal hernias, left greater than right. There is a fat containing periumbilical hernia. Musculoskeletal. No acute displaced fractures. IMPRESSION: 1. No acute abdominopelvic abnormality. 2. Bilateral fat containing inguinal hernias, left greater than right. 3. Fat containing periumbilical hernia. 4. Cardiomegaly and coronary artery disease. 5. Anemia. Aortic Atherosclerosis (ICD10-I70.0). Electronically Signed   By: Constance Holster M.D.   On: 12/03/2019 02:49   DG Chest Port 1 View  Result Date: 2019/12/09 CLINICAL DATA:  85 year old male with acute renal insufficiency. EXAM: PORTABLE CHEST 1 VIEW COMPARISON:  Portable chest 12/14/2019 and earlier. FINDINGS: Portable AP semi upright view at 1009 hours. Stable cardiomegaly and mediastinal contours. Prior CABG. Diffuse increased bilateral pulmonary interstitial opacity has not significantly changed, and remains increased compared to 2020. No pneumothorax. Small pleural effusions would be difficult to exclude. No consolidation. Negative visible bowel  gas pattern. No acute osseous abnormality identified. IMPRESSION: Cardiomegaly with suspected pulmonary edema not significantly changed. Questionable small pleural effusions. Electronically Signed   By: Genevie Ann M.D.   On: 12-09-2019 10:24   DG Chest Portable 1 View  Result Date: 12/07/2019 CLINICAL DATA:  Shortness of breath.  Extremity swelling. EXAM: PORTABLE CHEST 1 VIEW COMPARISON:  Radiograph 09/10/2018 FINDINGS: Post median sternotomy and CABG. Mild cardiomegaly. There is increasing interstitial thickening suspicious for pulmonary edema. No large pleural effusion. No evidence of focal airspace disease. No pneumothorax. Degenerative change of both shoulders. IMPRESSION: Cardiomegaly with increasing interstitial thickening suspicious for pulmonary edema. Electronically Signed   By: Keith Rake M.D.   On: 12/26/2019 21:08   US Abdomen Limited RUQ  Result Date: 12/03/2019 CLINICAL DATA:  Elevated liver function tests. EXAM: ULTRASOUND ABDOMEN LIMITED RIGHT UPPER QUADRANT COMPARISON:  December 02, 2019.  May 05, 2015. FINDINGS: Gallbladder: No gallstones or wall thickening visualized. No sonographic Murphy sign noted by sonographer. Small amount of sludge is noted within the gallbladder lumen. Common bile duct: Diameter: 3 mm which is within normal limits. Liver: No focal lesion identified. Within normal limits in parenchymal echogenicity. Portal vein is patent on color Doppler imaging with normal direction of blood flow towards the liver. Other: None. IMPRESSION: Small amount of sludge seen within gallbladder lumen. No other abnormality seen in the right upper quadrant of the abdomen. Electronically Signed   By: Marijo Conception M.D.   On: 12/03/2019 15:02        Scheduled Meds: . Chlorhexidine Gluconate Cloth  6 each Topical Daily   Continuous Infusions:   LOS: 2 days        Hosie Poisson, MD [facility]   To contact the attending provider between 7A-7P or the covering  provider during after hours 7P-7A, please log into the web site [email or web address] and access using universal [place] password for that web site. If you do not have the password, please call the hospital operator.  12-20-2019, 1:34 PM

## 2019-12-29 NOTE — Progress Notes (Signed)
Daily Progress Note   Patient Name: Gary Grant       Date: 2020-01-01 DOB: 1923/03/28  Age: 86 y.o. MRN#: [id number] Attending Physician: Hosie Poisson, MD Primary Care Physician: [facility] [place] Date: 12/03/2019  Reason for Consultation/Follow-up: Establishing goals of care  Subjective: Patient is resting in bed with daughter at bedside. He states he does not feel any better than yesterday. He and his daughter tell me he has not had a good appetite for a while. We discussed his thoughts on care moving forward. He states he will do whatever he is told. Advised that the decisions on care are his to make, and we will honor them. He states he wants to live longer, but as he stated yesterday, he does not want CRP, ventilator support, or dialysis. He is clear he would not want a feeding tube even if it would prolong his life. He states he does not want to prolong his life, but wants to try to gain strength to be as independent as possible for whatever time he has left. He would like to go to rehab, but will not live there long term.   Recommend palliative at D/C.   Length of Stay: 2  Current Medications: Scheduled Meds:  . Chlorhexidine Gluconate Cloth  6 each Topical Daily    Continuous Infusions:   PRN Meds: acetaminophen **OR** acetaminophen, ondansetron **OR** ondansetron (ZOFRAN) IV  Physical Exam Pulmonary:     Effort: Pulmonary effort is normal.  Neurological:     Mental Status: He is alert.             Vital Signs: BP (!) 139/59   Pulse 84   Temp 97.7 F (36.5 C)   Resp 20   Ht 6' (1.829 m)   Wt 97.2 kg   SpO2 91%   BMI 29.06 kg/m  SpO2: SpO2: 91 % O2 Device: O2 Device: Room Air O2 Flow Rate:    Intake/output summary:   Intake/Output  Summary (Last 24 hours) at 01/01/2020 1426 Last data filed at 2020-01-01 0921 Gross per 24 hour  Intake 120 ml  Output 900 ml  Net -780 ml   LBM: Last BM Date: 12/03/19 Baseline Weight: Weight: 97.2 kg Most recent weight: Weight: 97.2 kg       Palliative Assessment/Data: 30%  Patient Active Problem List   Diagnosis Date Noted  . Pressure injury of skin 12/30/2019  . CKD (chronic kidney disease), stage IV ([place]) 12/13/2019  . Acute urinary retention 12/22/2019  . Hypotension 12/21/2019  . Anemia of chronic kidney failure, stage 4 (severe) ([place]) 12/01/2019  . Chronic anticoagulation 12/13/2019  . Acute metabolic encephalopathy 02/40/9735  . AF (paroxysmal atrial fibrillation) ([place]) 12/14/2019  . Elevated LFTs 12/18/2019  . Chronic diastolic heart failure ([place]) 09/25/2017  . HTN (hypertension) 09/25/2017  . Lymphedema 09/25/2017  . Atrial fibrillation with RVR ([place]) 04/17/2017  . NSTEMI (non-ST elevated myocardial infarction) ([place]) 12/20/2015  . Leg weakness, bilateral 11/29/2015  . Falls 11/29/2015  . Degenerative disc disease, lumbar 11/29/2015  . Back pain 11/29/2015  . Bradycardia, drug induced 11/29/2015  . Dehydration 11/29/2015  . Anemia 11/29/2015  . Hypothyroidism 11/29/2015  . Weakness 11/26/2015  . Nephrolithiasis 05/07/2015  . Constipation 05/07/2015  . AKI (acute kidney injury) ([place]) 05/05/2015  . CAD (coronary artery disease) 07/04/2013    Palliative Care Assessment & Plan    Recommendations/Plan:  Recommend palliative to follow at D/C. He would be interested in medication to increase his appetite. Would recommend initiating Remeron or Megace.   Code Status:    Code Status Orders  (From admission, onward)         Start     Ordered   12/03/19 1146  Do not attempt resuscitation (DNR)  Continuous       Question Answer Comment  In the event of cardiac or respiratory ARREST Do not call a "code blue"   In the event of cardiac or respiratory  ARREST Do not perform Intubation, CPR, defibrillation or ACLS   In the event of cardiac or respiratory ARREST Use medication by any route, position, wound care, and other measures to relive pain and suffering. May use oxygen, suction and manual treatment of airway obstruction as needed for comfort.   Comments No dialysis. MOST form in [place].      12/03/19 1146        Code Status History    Date Active Date Inactive Code Status Order ID Comments User Context   12/17/2019 2313 12/03/2019 1146 Full Code [id number]  Athena Masse, MD ED   04/17/2017 1815 04/22/2017 1713 DNR [id number]  [name], MD Inpatient   12/19/2015 1108 12/21/2015 1639 Full Code [id number]  Hower, Aaron Mose, MD ED   11/26/2015 1911 11/29/2015 1944 Full Code [id number]  Hower, Aaron Mose, MD ED   05/06/2015 0028 05/07/2015 1650 Full Code [id number]  Gladstone Lighter, MD Inpatient   Advance Care Planning Activity       Prognosis:   Poor overall   Thank you for allowing the Palliative Medicine Team to assist in the care of this patient.   Time In: 1:20 Time Out: 2:38 Total Time 72 min Prolonged Time Billed  yes      Greater than 50%  of this time was spent counseling and coordinating care related to the above assessment and plan.  Asencion Gowda, NP  Please contact Palliative Medicine Team phone at [phone] for questions and concerns.

## 2019-12-29 NOTE — Evaluation (Signed)
Occupational Therapy Evaluation Patient Details Name: Gary Grant MRN: [id number] DOB: 03-Jul-1923 Today's Date: 12/24/19    History of Present Illness Gary Grant is a 62yoM (PMH: CAD s/p CABG, PAF, dCHF, hypoTSH, BPH) comes to [facility] ED on 10/5 due to acute onset weakness, multiple falls, urinary retention. Pt noted to have anemia c post hydration Hb drop into 6s, received blood in ED. Per paliative note, baseline includes very minimal mobility at home due to chronic leg weakness. Per chart review, pt stopped driving in [id number], DTR helps with transportation. Pt has a history of chronic LE buckling, began using a RW for gait in 2019, but now reports to perform most mobility c power WC.   Clinical Impression   Prior to his hospitalization on 10/5, Gary Grant has been living at home with his daughter, who is available during the day (when not sleeping) and working at night. Pt uses a power WC in his single-level home with ramped entrance and reports that he performs stand-pivot transfers from bed to [facility] independently. He also states that he performs toileting by himself, including walking without AD from bathroom door to toilet, as WC does not fit through door. Daughter assists with dressing and bathing, which consists primarily of sponge baths. Pt is dependent for IADLs. Pt demonstrates decreased strength, balance, and endurance; limited awareness of safety concerns; and frequent falls. (When asked by therapist how many falls he has had in previous 6 months, pt responded, "I've fell so many times I can't count them." Pt reports that, on a recent car trip (with daughter driving), pt fell getting into the car at home, then again getting out of the care upon returning home.) Pt required MOD A for bed mobility today, with sharp increase in SOB and drop in O2 sats to mid-80s with even limited movement. Pt unable to sit EOB w/o MOD A for balance and declines standing, stating that he does not think his legs  are "strong enough" to support him today. Pt engages in self-feeding with set-up and VCs/encouragement for lifting utensils, and reports that he feels he is having difficulty swallowing today. Gary Grant repeatedly stated that he felt his body "is wearing out" and that he is "at the end of the line." He also stated emphatically that he did not want to go to a nursing facility. Discussed possibility of palliative care and hospice, and pt indicated he would like to consider that option. MD entered room and joined the conversation, saying she would make a palliative care referral for Gary Grant. While pt is hospitalized, he could benefit from skilled OT services, to maintain strength, increase endurance, engage in regular bed mobility and OOB activity as possible. Post DC, recommend pt return home with palliative or hospice care in place.      Follow Up Recommendations  Other (comment) (Palliative care/hospice)    Equipment Recommendations  3 in 1 bedside commode;Hospital bed    Recommendations for Other Services Speech consult (Pt reports having swallowing difficulties today)     Precautions / Restrictions Precautions Precautions: Fall Precaution Comments: Low HB; Restrictions Weight Bearing Restrictions: No      Mobility Bed Mobility Overal bed mobility: (P) Needs Assistance Bed Mobility: (P) Supine to Sit;Sit to Supine Rolling: (P) Mod assist   Supine to sit: (P) Mod assist Sit to supine: (P) Mod assist   General bed mobility comments: (P) verbal cues for sequencing and technique. assistance for trunk and BLE to sit up on edge of bed  Transfers Overall transfer level: (P) Needs assistance               General transfer comment: (P) attempted sit to stand transfer. patient unable to clear hips from bed surface with maximal assistance. incrimental scooting to right side x 4 bouts with Min guard and verbal cues for technique. patient is fatigued with activity     Balance  Overall balance assessment: (P) Needs assistance;History of Falls Sitting-balance support: (P) Feet supported Sitting balance-Leahy Scale: (P) Fair                                     ADL either performed or assessed with clinical judgement   ADL Overall ADL's : Needs assistance/impaired Eating/Feeding: Set up, minimal assistance Eating/Feeding Details (indicate cue type and reason): reports feeling as if it is difficult for him to swallow today Grooming: Moderate assistance                                       Vision Baseline Vision/History: No visual deficits       Perception     Praxis      Pertinent Vitals/Pain Pain Assessment: No/denies pain     Hand Dominance Right   Extremity/Trunk Assessment Upper Extremity Assessment Upper Extremity Assessment: Generalized weakness   Lower Extremity Assessment Lower Extremity Assessment: Generalized weakness       Communication Communication Communication: HOH   Cognition Arousal/Alertness: Awake/alert Behavior During Therapy: WFL for tasks assessed/performed Overall Cognitive Status: Within Functional Limits for tasks assessed                                     General Comments  pressure sore on R buttocks    Exercises Exercises: (P) General Lower Extremity;Other exercises Other Exercises Other Exercises: Engaged in bed mobility, self-feeding, grooming. Educ pt re: role of OT, POC, breathing techniques to allievate SOB, DC possibilities, role of palliative care.   Shoulder Instructions      Home Living Family/patient expects to be discharged to:: Private residence Living Arrangements: Children Available Help at Discharge: Family;Available PRN/intermittently (daughter available during day, works nights) Type of Home: House Home Access: Stairs to enter;Ramped entrance [employer] of Steps: [place]: One level     Bathroom Shower/Tub: Tub/shower  unit         Home Equipment: Environmental consultant - 2 wheels;Cane - single point;Shower seat;Wheelchair - power   Additional Comments: Iselin for all mobility needs in home and out; reports to perform SPT bed to/from [facility] ad lib, no other device used. WC does not go through bathroom door, so pt walks from [facility] to toilet      Prior Functioning/Environment Level of Independence: Needs assistance  Gait / Transfers Assistance Needed: power WC in and out of house; SPT without device ADL's / Homemaking Assistance Needed: Pt reports he is IND w/ toileting, including walking from bathroom door to toilet. Daughter assists with dressing, bathing, cooking. Communication / Swallowing Assistance Needed: Pt reports that he is having some difficultly swallowing today          OT Problem List: Decreased strength;Decreased activity tolerance;Impaired balance (sitting and/or standing);Decreased knowledge of use of DME or AE      OT Treatment/Interventions:  Self-care/ADL training;DME and/or AE instruction;Therapeutic activities;Balance training;Therapeutic exercise;Energy conservation;Patient/family education    OT Goals(Current goals can be found in the care plan section) Acute Rehab OT Goals Patient Stated Goal: to go home and not to a SNF OT Goal Formulation: With patient Time For Goal Achievement: 12/18/19 Potential to Achieve Goals: Good ADL Goals Pt Will Perform Grooming: sitting;with min assist;with set-up Pt Will Transfer to Toilet: with mod assist;bedside commode (using LRAD) Additional ADL Goal #1: Pt can identify and demonstrate 2+ fall prevention strategies  OT Frequency: Min 1X/week   Barriers to D/C: Inaccessible home environment;Decreased caregiver support          Co-evaluation              AM-PAC OT "6 Clicks" Daily Activity     Outcome Measure Help from another person eating meals?: A Little Help from another person taking care of personal grooming?: A Little Help from another  person toileting, which includes using toliet, bedpan, or urinal?: A Lot Help from another person bathing (including washing, rinsing, drying)?: A Lot Help from another person to put on and taking off regular upper body clothing?: A Lot Help from another person to put on and taking off regular lower body clothing?: A Lot 6 Click Score: 14   End of Session Equipment Utilized During Treatment: Oxygen (3 L O2 via [place]) Nurse Communication: Other (comment) (vitals)  Activity Tolerance: Patient limited by fatigue Patient left: in bed;with call bell/phone within reach;with bed alarm set;with nursing/sitter in room (MD in room, engaging in discussion re: DC, palliative care)  OT Visit Diagnosis: Unsteadiness on feet (R26.81);Repeated falls (R29.6);History of falling (Z91.81);Muscle weakness (generalized) (M62.81);Other abnormalities of gait and mobility (R26.89)                Time: 1103-1594 OT Time Calculation (min): 47 min Charges:  OT General Charges $OT Visit: 1 Visit OT Evaluation $OT Eval Moderate Complexity: 1 Mod OT Treatments $Self Care/Home Management : 38-52 mins  Josiah Lobo, PhD, MS, OTR/L ascom [phone] Dec 11, 2019, 1:34 PM

## 2019-12-29 DEATH — deceased
